# Patient Record
Sex: Male | Born: 1945 | Race: Black or African American | Hispanic: No | Marital: Married | Smoking: Former smoker
Health system: Southern US, Community
[De-identification: ages and names within clinical notes are randomized; demographics above are authoritative.]

## PROBLEM LIST (undated history)

## (undated) DIAGNOSIS — R569 Unspecified convulsions: Secondary | ICD-10-CM

## (undated) DIAGNOSIS — R7611 Nonspecific reaction to tuberculin skin test without active tuberculosis: Secondary | ICD-10-CM

## (undated) DIAGNOSIS — M109 Gout, unspecified: Secondary | ICD-10-CM

## (undated) DIAGNOSIS — I10 Essential (primary) hypertension: Secondary | ICD-10-CM

## (undated) DIAGNOSIS — J301 Allergic rhinitis due to pollen: Secondary | ICD-10-CM

## (undated) DIAGNOSIS — K635 Polyp of colon: Secondary | ICD-10-CM

## (undated) DIAGNOSIS — E785 Hyperlipidemia, unspecified: Secondary | ICD-10-CM

## (undated) DIAGNOSIS — M1990 Unspecified osteoarthritis, unspecified site: Secondary | ICD-10-CM

## (undated) DIAGNOSIS — R339 Retention of urine, unspecified: Secondary | ICD-10-CM

## (undated) DIAGNOSIS — J45909 Unspecified asthma, uncomplicated: Secondary | ICD-10-CM

## (undated) HISTORY — DX: Polyp of colon: K63.5

## (undated) HISTORY — DX: Hyperlipidemia, unspecified: E78.5

## (undated) HISTORY — DX: Allergic rhinitis due to pollen: J30.1

## (undated) HISTORY — DX: Unspecified convulsions: R56.9

## (undated) HISTORY — PX: TOOTH EXTRACTION: SUR596

## (undated) HISTORY — DX: Essential (primary) hypertension: I10

## (undated) HISTORY — DX: Unspecified osteoarthritis, unspecified site: M19.90

## (undated) HISTORY — DX: Nonspecific reaction to tuberculin skin test without active tuberculosis: R76.11

## (undated) HISTORY — DX: Retention of urine, unspecified: R33.9

## (undated) HISTORY — PX: COLONOSCOPY: SHX174

## (undated) HISTORY — DX: Gout, unspecified: M10.9

## (undated) HISTORY — DX: Unspecified asthma, uncomplicated: J45.909

---
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
## 1996-12-10 HISTORY — PX: HERNIA REPAIR: SHX51

## 2013-07-13 LAB — LIPID PANEL
CHOLESTEROL: 187 mg/dL (ref 0–200)
HDL: 37 mg/dL (ref 35–70)
LDL CALC: 121 mg/dL
Triglycerides: 145 mg/dL (ref 40–160)

## 2013-07-13 LAB — BASIC METABOLIC PANEL
Creatinine: 0.8 mg/dL (ref 0.6–1.3)
Glucose: 98 mg/dL
Potassium: 3.4 mmol/L (ref 3.4–5.3)

## 2013-11-26 ENCOUNTER — Other Ambulatory Visit: Payer: Self-pay | Admitting: Family Medicine

## 2013-11-26 NOTE — Telephone Encounter (Signed)
Pt requesting refill of abx. pls advise 

## 2013-11-27 NOTE — Telephone Encounter (Signed)
Rx approved and sent to requested pharmacy per Nicki Reaper, NP.

## 2013-12-24 ENCOUNTER — Other Ambulatory Visit: Payer: Self-pay | Admitting: Internal Medicine

## 2013-12-24 NOTE — Telephone Encounter (Signed)
Spoke to pt and advised per Dr Deborra Medina. He states that he will wait until 12/29/13 appt to have refilled

## 2013-12-24 NOTE — Telephone Encounter (Signed)
Needs to get refilled from previous provider.  I cannot refill medications before he is established here.

## 2013-12-24 NOTE — Telephone Encounter (Signed)
Last filled 02/2013, not by anyone from this practice--pt has a new pt appt with you on 12/29/13--please advise

## 2013-12-29 ENCOUNTER — Encounter: Payer: Self-pay | Admitting: Family Medicine

## 2013-12-29 ENCOUNTER — Ambulatory Visit (INDEPENDENT_AMBULATORY_CARE_PROVIDER_SITE_OTHER): Payer: Federal, State, Local not specified - PPO | Admitting: Family Medicine

## 2013-12-29 ENCOUNTER — Telehealth: Payer: Self-pay

## 2013-12-29 VITALS — BP 186/92 | HR 89 | Temp 98.2°F | Ht 72.5 in | Wt 219.5 lb

## 2013-12-29 DIAGNOSIS — I10 Essential (primary) hypertension: Secondary | ICD-10-CM

## 2013-12-29 DIAGNOSIS — E785 Hyperlipidemia, unspecified: Secondary | ICD-10-CM | POA: Insufficient documentation

## 2013-12-29 DIAGNOSIS — N529 Male erectile dysfunction, unspecified: Secondary | ICD-10-CM | POA: Insufficient documentation

## 2013-12-29 DIAGNOSIS — E876 Hypokalemia: Secondary | ICD-10-CM | POA: Insufficient documentation

## 2013-12-29 DIAGNOSIS — M109 Gout, unspecified: Secondary | ICD-10-CM | POA: Insufficient documentation

## 2013-12-29 DIAGNOSIS — R399 Unspecified symptoms and signs involving the genitourinary system: Secondary | ICD-10-CM | POA: Insufficient documentation

## 2013-12-29 MED ORDER — ALLOPURINOL 300 MG PO TABS: 300.0000 mg | ORAL_TABLET | Freq: Every day | ORAL | Status: DC

## 2013-12-29 MED ORDER — LISINOPRIL 40 MG PO TABS: 40.0000 mg | ORAL_TABLET | Freq: Every day | ORAL | Status: DC

## 2013-12-29 MED ORDER — KETOCONAZOLE 2 % EX CREA: 1.0000 "application " | TOPICAL_CREAM | Freq: Every day | CUTANEOUS | Status: DC

## 2013-12-29 NOTE — Patient Instructions (Addendum)
It was nice to meet you. Please come see me in 1 week.  Please STOP taking your current blood pressure medication- lisinopril-HCTZ.  Start taking Lisinopril 40 mg daily.   We will check some blood work when I see you next week.

## 2013-12-29 NOTE — Assessment & Plan Note (Signed)
See note under HTN. Continue allopurinol.

## 2013-12-29 NOTE — Addendum Note (Signed)
Addended by: Modena Nunnery on: 12/29/2013 04:11 PM   Modules accepted: Orders

## 2013-12-29 NOTE — Assessment & Plan Note (Signed)
May be able to d/c potassium supplementation once we d/c diuretic.  Check labs next week. The patient indicates understanding of these issues and agrees with the plan.

## 2013-12-29 NOTE — Assessment & Plan Note (Signed)
Very elevated today but just took medication. Per pt, has been normotensive.  Given h/o gout and hypokalemia, will d/c Prinivil (has diuretic which can worsen both of these issues).  Start Lisinopril 40 mg daily.  Follow up in 1 week, will likely need to add a second agent.  If he is symptomatic, will need to add second agent before his appt. The patient indicates understanding of these issues and agrees with the plan.

## 2013-12-29 NOTE — Progress Notes (Signed)
Subjective:    Patient ID: Edward Wolf, male    DOB: November 15, 1946, 68 y.o.   MRN: [id number]  HPI  Very pleasant 68 yo male here to establish care.  1.  HTN- very high today.  Just took his medication- lisinopril 20-HCTZ 12. 5 mg daily.  Per pt, typically very well controlled. Denies any HA, blurred vision, CP or SOB.  2.  Hypokalemia- takes kdur but often forgets to take it.  3.  Gout- diagnosed within the last year.  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  Typically has flares in right great toe.  Very sensitive to diet- gets a flare if he eats meat.  4.  HLD- on Zocor 40 mg daily. Lipids last checked in 07/2013.   Lab Results  Component Value Date   CHOL 187 07/13/2013   HDL 37 07/13/2013   LDLCALC 121 07/13/2013   TRIG 145 07/13/2013   Patient Active Problem List   Diagnosis Date Noted  . Lower urinary tract symptoms (LUTS) 12/29/2013  . Gout 12/29/2013  . Hypokalemia 12/29/2013  . Erectile dysfunction 12/29/2013  . Hyperlipidemia   . Hypertension    Past Medical History  Diagnosis Date  . Asthma   . Arthritis   . Hay fever   . Colon polyps   . Positive TB test   . Hyperlipidemia   . Hypertension    Past Surgical History  Procedure Laterality Date  . Wrist surgery  1964  . Hernia repair  1998  . Colonoscopy    . Tooth extraction     History  Substance Use Topics  . Smoking status: Current Every Day Smoker  . Smokeless tobacco: Never Used  . Alcohol Use: Yes   Family History  Problem Relation Age of Onset  . Arthritis Mother   . Cancer Mother   . Hyperlipidemia Mother   . Heart disease Mother   . Hypertension Mother   . Arthritis Father   . Cancer Father   . Hyperlipidemia Father   . Heart disease Father   . Hypertension Father   . Hyperlipidemia Sister   . Hypertension Sister   . Heart disease Sister   . Arthritis Maternal Grandmother   . Arthritis Maternal Grandfather   . Arthritis Paternal Grandmother   . Arthritis Paternal Grandfather    Allergies no  known allergies Current Outpatient Prescriptions on File Prior to Visit  Medication Sig Dispense Refill  . minocycline (MINOCIN,DYNACIN) 100 MG capsule TAKE 1 CAPSULE BY MOUTH TWICE DAILY  180 capsule  1   No current facility-administered medications on file prior to visit.   The PMH, PSH, Social History, Family History, Medications, and allergies have been reviewed in [facility], and have been updated if relevant.   Review of Systems See HPI No HA No CP No blurred vision No SOB No LE edema    Objective:   Physical Exam  Constitutional: He is oriented to person, place, and time. He appears well-developed and well-nourished. No distress.  HENT:  Head: Normocephalic and atraumatic.  Eyes: Pupils are equal, round, and reactive to light.  Neck: Normal range of motion.  Cardiovascular: Normal rate, regular rhythm and normal heart sounds.   Pulmonary/Chest: Effort normal and breath sounds normal.  Abdominal: Soft. Bowel sounds are normal.  Neurological: He is alert and oriented to person, place, and time.  Skin: Skin is warm and dry.  Psychiatric: He has a normal mood and affect. His behavior is normal. Judgment and thought content normal.  BP 186/92  Pulse 89  Temp(Src) 98.2 F (36.8 C) (Oral)  Ht 6' 0.5" (1.842 m)  Wt 219 lb 8 oz (99.565 kg)  BMI 29.34 kg/m2  SpO2 97%        Assessment & Plan:

## 2013-12-29 NOTE — Progress Notes (Signed)
Pre-visit discussion using our clinic review tool. No additional management support is needed unless otherwise documented below in the visit note.  

## 2013-12-29 NOTE — Assessment & Plan Note (Signed)
Well controlled on current dose of Zocor. No changes.

## 2013-12-29 NOTE — Telephone Encounter (Signed)
Pt was seen today and was to call back with information about inhaler to be sent to pharmacy; proventil inhaler HFA 90 mcg; may inhale 2 puffs four times a day as needed. [facility].

## 2013-12-29 NOTE — Telephone Encounter (Signed)
Spoke to pt who states that he is not needing a refill at this time. He was seen at ov earlier today and was unsure as to what dosage medication was. I have updated his chart with correct med and d/c incorrect entry

## 2013-12-30 ENCOUNTER — Telehealth: Payer: Self-pay | Admitting: Family Medicine

## 2013-12-30 NOTE — Telephone Encounter (Signed)
Relevant patient education assigned to patient using Emmi. ° °

## 2014-01-01 ENCOUNTER — Other Ambulatory Visit: Payer: Self-pay | Admitting: Family Medicine

## 2014-01-01 DIAGNOSIS — I10 Essential (primary) hypertension: Secondary | ICD-10-CM

## 2014-01-01 DIAGNOSIS — E876 Hypokalemia: Secondary | ICD-10-CM

## 2014-01-05 ENCOUNTER — Other Ambulatory Visit (INDEPENDENT_AMBULATORY_CARE_PROVIDER_SITE_OTHER): Payer: Federal, State, Local not specified - PPO

## 2014-01-05 ENCOUNTER — Telehealth: Payer: Self-pay | Admitting: Family Medicine

## 2014-01-05 DIAGNOSIS — I10 Essential (primary) hypertension: Secondary | ICD-10-CM

## 2014-01-05 DIAGNOSIS — E876 Hypokalemia: Secondary | ICD-10-CM

## 2014-01-05 LAB — BASIC METABOLIC PANEL
BUN: 8 mg/dL (ref 6–23)
CHLORIDE: 104 meq/L (ref 96–112)
CO2: 28 mEq/L (ref 19–32)
Calcium: 9.2 mg/dL (ref 8.4–10.5)
Creatinine, Ser: 0.9 mg/dL (ref 0.4–1.5)
GFR: 114.22 mL/min (ref >60.00)
Glucose, Bld: 85 mg/dL (ref 70–99)
POTASSIUM: 3.1 meq/L — AB (ref 3.5–5.1)
SODIUM: 139 meq/L (ref 135–145)

## 2014-01-05 NOTE — Telephone Encounter (Signed)
Yes please have him make an appt to see me so we can add another BP agent. Thanks.

## 2014-01-05 NOTE — Telephone Encounter (Signed)
Patient notified as instructed by telephone. Follow-up appointment scheduled. 

## 2014-01-05 NOTE — Telephone Encounter (Signed)
Pt was in office and requested BP check to send to Dr. Deborra Medina (he said that she requested this at his last visit).  BP 165/105.  If pt needs to be called, please call cell at [phone].

## 2014-01-07 ENCOUNTER — Encounter: Payer: Self-pay | Admitting: Family Medicine

## 2014-01-07 ENCOUNTER — Ambulatory Visit (INDEPENDENT_AMBULATORY_CARE_PROVIDER_SITE_OTHER): Payer: Federal, State, Local not specified - PPO | Admitting: Family Medicine

## 2014-01-07 VITALS — BP 162/100 | HR 82 | Temp 98.4°F | Ht 72.5 in | Wt 219.2 lb

## 2014-01-07 DIAGNOSIS — E876 Hypokalemia: Secondary | ICD-10-CM

## 2014-01-07 DIAGNOSIS — I10 Essential (primary) hypertension: Secondary | ICD-10-CM

## 2014-01-07 MED ORDER — LISINOPRIL-HYDROCHLOROTHIAZIDE 20-12.5 MG PO TABS: 2.0000 | ORAL_TABLET | Freq: Every day | ORAL | Status: DC

## 2014-01-07 MED ORDER — POTASSIUM CHLORIDE CRYS ER 20 MEQ PO TBCR: 20.0000 meq | EXTENDED_RELEASE_TABLET | Freq: Every day | ORAL | Status: DC

## 2014-01-07 NOTE — Assessment & Plan Note (Signed)
Deteriorated. Had a long discussion with Edward Wolf.  He is aware that diuretic may exacerbate his gout but he has adjusted his diet and would like to go back on HCTZ- will add 25 mg to Lisinopril 40 mg as he BP was not controlled on previous dose.  Follow up with me next week. The patient indicates understanding of these issues and agrees with the plan.

## 2014-01-07 NOTE — Progress Notes (Signed)
Subjective:    Patient ID: Edward Wolf, male    DOB: 1946-04-23, 68 y.o.   MRN: [id number]  HPI  Very pleasant 68 yo male here for follow up.  1.  HTN- established care with me on 12/29/2013.  He was on Prinivil and BP was very high today.   I d/c'd the prinivil since he has been having issues with gout and HCTZ can exacerbate gout. Start lisinopril 40 mg daily and had him follow up today as we will likely need to add a second agent.  Has not felt right since we made the change- feels BP is up and not urinating as completely.  2.  Hypokalemia- takes kdur but often forgets to take it.  Checked his lytes 2 days ago since we are stopping his diuretic, may no longer need supplementation but potassium remains a little low. Lab Results  Component Value Date   NA 139 01/05/2014   K 3.1* 01/05/2014   CL 104 01/05/2014   CO2 28 01/05/2014       Patient Active Problem List   Diagnosis Date Noted  . Lower urinary tract symptoms (LUTS) 12/29/2013  . Gout 12/29/2013  . Hypokalemia 12/29/2013  . Erectile dysfunction 12/29/2013  . Hyperlipidemia   . Hypertension    Past Medical History  Diagnosis Date  . Asthma   . Arthritis   . Hay fever   . Colon polyps   . Positive TB test   . Hyperlipidemia   . Hypertension    Past Surgical History  Procedure Laterality Date  . Wrist surgery  1964  . Hernia repair  1998  . Colonoscopy    . Tooth extraction     History  Substance Use Topics  . Smoking status: Current Every Day Smoker  . Smokeless tobacco: Never Used  . Alcohol Use: Yes   Family History  Problem Relation Age of Onset  . Arthritis Mother   . Cancer Mother   . Hyperlipidemia Mother   . Heart disease Mother   . Hypertension Mother   . Arthritis Father   . Cancer Father   . Hyperlipidemia Father   . Heart disease Father   . Hypertension Father   . Hyperlipidemia Sister   . Hypertension Sister   . Heart disease Sister   . Arthritis Maternal Grandmother   .  Arthritis Maternal Grandfather   . Arthritis Paternal Grandmother   . Arthritis Paternal Grandfather    No Known Allergies Current Outpatient Prescriptions on File Prior to Visit  Medication Sig Dispense Refill  . albuterol (PROVENTIL HFA) 108 (90 BASE) MCG/ACT inhaler Inhale 2 puffs into the lungs 4 (four) times daily as needed for wheezing or shortness of breath.      . allopurinol (ZYLOPRIM) 300 MG tablet Take 1 tablet (300 mg total) by mouth daily.  30 tablet  3  . ciprofloxacin (CIPRO) 500 MG tablet Take 500 mg by mouth 2 (two) times daily.      Marland Kitchen ketoconazole (NIZORAL) 2 % cream Apply 1 application topically daily.  15 g  1  . lisinopril (PRINIVIL,ZESTRIL) 40 MG tablet Take 1 tablet (40 mg total) by mouth daily.  30 tablet  3  . minocycline (MINOCIN,DYNACIN) 100 MG capsule TAKE 1 CAPSULE BY MOUTH TWICE DAILY  180 capsule  1  . potassium chloride (K-DUR,KLOR-CON) 10 MEQ tablet Take 10 mEq by mouth daily. Take 3 tabs by mouth daily      . sildenafil (VIAGRA) 100 MG tablet Take  100 mg by mouth daily as needed for erectile dysfunction.      . simvastatin (ZOCOR) 40 MG tablet Take 40 mg by mouth at bedtime.       No current facility-administered medications on file prior to visit.   The PMH, PSH, Social History, Family History, Medications, and allergies have been reviewed in [facility], and have been updated if relevant.   Review of Systems See HPI No HA No CP No blurred vision No SOB No LE edema    Objective:   Physical Exam  BP 162/100  Pulse 82  Temp(Src) 98.4 F (36.9 C) (Oral)  Ht 6' 0.5" (1.842 m)  Wt 219 lb 4 oz (99.451 kg)  BMI 29.31 kg/m2  SpO2 99%  Constitutional: He is oriented to person, place, and time. He appears well-developed and well-nourished. No distress.  HENT:  Head: Normocephalic and atraumatic.  Eyes: Pupils are equal, round, and reactive to light.  Neck: Normal range of motion.  Cardiovascular: Normal rate, regular rhythm and normal heart sounds.    Pulmonary/Chest: Effort normal and breath sounds normal.  Abdominal: Soft. Bowel sounds are normal.  Neurological: He is alert and oriented to person, place, and time.  Skin: Skin is warm and dry.  Psychiatric: He has a normal mood and affect. His behavior is normal. Judgment and thought content normal.     Assessment & Plan:

## 2014-01-07 NOTE — Patient Instructions (Signed)
Let's stop lisinopril.  Start HCTZ 25 mg daily and lisinopril 20 mg daily- 2 tablets every morning.  We are changing your potassium to 20 meq- 1 tablet daily.  Please come see me before you leave on your trip.

## 2014-01-07 NOTE — Assessment & Plan Note (Signed)
Persistent.  He is having a hard time remember to take 10 mEq 3 times daily.  Will increase to 20 mEq once daily. The patient indicates understanding of these issues and agrees with the plan.

## 2014-01-07 NOTE — Progress Notes (Signed)
Pre-visit discussion using our clinic review tool. No additional management support is needed unless otherwise documented below in the visit note.  

## 2014-01-12 ENCOUNTER — Telehealth: Payer: Self-pay | Admitting: *Deleted

## 2014-01-12 NOTE — Telephone Encounter (Signed)
Fantastic! Thanks for the update.

## 2014-01-12 NOTE — Telephone Encounter (Signed)
Patient called to give BP readings, in the am before medication 124/88, after medication in the afternoon  118/78. Medication is working fine and no swelling. Has an appointment 01/25/14.

## 2014-01-13 ENCOUNTER — Ambulatory Visit: Payer: Federal, State, Local not specified - PPO | Admitting: Family Medicine

## 2014-01-25 ENCOUNTER — Ambulatory Visit: Payer: Federal, State, Local not specified - PPO | Admitting: Family Medicine

## 2014-02-04 ENCOUNTER — Ambulatory Visit: Payer: Federal, State, Local not specified - PPO | Admitting: Family Medicine

## 2014-02-08 ENCOUNTER — Ambulatory Visit (INDEPENDENT_AMBULATORY_CARE_PROVIDER_SITE_OTHER): Payer: Federal, State, Local not specified - PPO | Admitting: Family Medicine

## 2014-02-08 ENCOUNTER — Encounter: Payer: Self-pay | Admitting: Family Medicine

## 2014-02-08 VITALS — BP 138/88 | HR 71 | Temp 98.1°F | Wt 226.8 lb

## 2014-02-08 DIAGNOSIS — E876 Hypokalemia: Secondary | ICD-10-CM

## 2014-02-08 DIAGNOSIS — I10 Essential (primary) hypertension: Secondary | ICD-10-CM

## 2014-02-08 LAB — BASIC METABOLIC PANEL
BUN: 13 mg/dL (ref 6–23)
CALCIUM: 9.2 mg/dL (ref 8.4–10.5)
CO2: 29 meq/L (ref 19–32)
Chloride: 102 mEq/L (ref 96–112)
Creatinine, Ser: 0.9 mg/dL (ref 0.4–1.5)
GFR: 106.98 mL/min (ref >60.00)
Glucose, Bld: 90 mg/dL (ref 70–99)
POTASSIUM: 3.1 meq/L — AB (ref 3.5–5.1)
SODIUM: 139 meq/L (ref 135–145)

## 2014-02-08 MED ORDER — POTASSIUM CHLORIDE CRYS ER 10 MEQ PO TBCR: 30.0000 meq | EXTENDED_RELEASE_TABLET | Freq: Every day | ORAL | Status: DC

## 2014-02-08 NOTE — Addendum Note (Signed)
Addended by: Lucille Passy on: 02/08/2014 07:24 PM   Modules accepted: Orders

## 2014-02-08 NOTE — Assessment & Plan Note (Signed)
Well controlled.  No changes. No gout flares.

## 2014-02-08 NOTE — Progress Notes (Signed)
Subjective:    Patient ID: Edward Wolf, male    DOB: 1946-06-19, 68 y.o.   MRN: [id number]  HPI  Very pleasant 68 yo male here for follow up.  1.  HTN- established care with me on 12/29/2013.  He was on Prinivil and BP was very high today.   I d/c'd the prinivil since he has been having issues with gout and HCTZ can exacerbate gout. He did not like how he felt off diurectic, so we restarted it.  He was aware it could exacerbate his gout but willing to take that risk.  Now taking Lisinopril-HCTZ 40-25 mg daily. Denies any HA, blurred vision, CP or SOB. BP has been well controlled.  2.  Hypokalemia- takes kdur but often forgets to take it.  I changed his dose 20 meq daily rather than three times daily dosing and he has been taking it more regularly. Lab Results  Component Value Date   NA 139 01/05/2014   K 3.1* 01/05/2014   CL 104 01/05/2014   CO2 28 01/05/2014       Patient Active Problem List   Diagnosis Date Noted  . Lower urinary tract symptoms (LUTS) 12/29/2013  . Gout 12/29/2013  . Hypokalemia 12/29/2013  . Erectile dysfunction 12/29/2013  . Hyperlipidemia   . Hypertension    Past Medical History  Diagnosis Date  . Asthma   . Arthritis   . Hay fever   . Colon polyps   . Positive TB test   . Hyperlipidemia   . Hypertension    Past Surgical History  Procedure Laterality Date  . Wrist surgery  1964  . Hernia repair  1998  . Colonoscopy    . Tooth extraction     History  Substance Use Topics  . Smoking status: Current Every Day Smoker  . Smokeless tobacco: Never Used  . Alcohol Use: Yes   Family History  Problem Relation Age of Onset  . Arthritis Mother   . Cancer Mother   . Hyperlipidemia Mother   . Heart disease Mother   . Hypertension Mother   . Arthritis Father   . Cancer Father   . Hyperlipidemia Father   . Heart disease Father   . Hypertension Father   . Hyperlipidemia Sister   . Hypertension Sister   . Heart disease Sister   . Arthritis  Maternal Grandmother   . Arthritis Maternal Grandfather   . Arthritis Paternal Grandmother   . Arthritis Paternal Grandfather    No Known Allergies Current Outpatient Prescriptions on File Prior to Visit  Medication Sig Dispense Refill  . albuterol (PROVENTIL HFA) 108 (90 BASE) MCG/ACT inhaler Inhale 2 puffs into the lungs 4 (four) times daily as needed for wheezing or shortness of breath.      . allopurinol (ZYLOPRIM) 300 MG tablet Take 1 tablet (300 mg total) by mouth daily.  30 tablet  3  . ketoconazole (NIZORAL) 2 % cream Apply 1 application topically daily.  15 g  1  . lisinopril-hydrochlorothiazide (PRINZIDE,ZESTORETIC) 20-12.5 MG per tablet Take 2 tablets by mouth daily.  60 tablet  3  . minocycline (MINOCIN,DYNACIN) 100 MG capsule TAKE 1 CAPSULE BY MOUTH TWICE DAILY  180 capsule  1  . potassium chloride SA (K-DUR,KLOR-CON) 20 MEQ tablet Take 1 tablet (20 mEq total) by mouth daily.  30 tablet  3  . sildenafil (VIAGRA) 100 MG tablet Take 100 mg by mouth daily as needed for erectile dysfunction.      . simvastatin (  ZOCOR) 40 MG tablet Take 40 mg by mouth at bedtime.       No current facility-administered medications on file prior to visit.   The PMH, PSH, Social History, Family History, Medications, and allergies have been reviewed in [facility], and have been updated if relevant.   Review of Systems See HPI No HA No CP No blurred vision No SOB No LE edema    Objective:   Physical Exam  BP 138/88  Pulse 71  Temp(Src) 98.1 F (36.7 C) (Oral)  Wt 226 lb 12 oz (102.853 kg)  SpO2 98%  Constitutional: He is oriented to person, place, and time. He appears well-developed and well-nourished. No distress.  HENT:  Head: Normocephalic and atraumatic.  Eyes: Pupils are equal, round, and reactive to light.  Neck: Normal range of motion.  Cardiovascular: Normal rate, regular rhythm and normal heart sounds.   Pulmonary/Chest: Effort normal and breath sounds normal.  Abdominal: Soft.  Bowel sounds are normal.  Neurological: He is alert and oriented to person, place, and time.  Skin: Skin is warm and dry.  Psychiatric: He has a normal mood and affect. His behavior is normal. Judgment and thought content normal.     Assessment & Plan:

## 2014-02-08 NOTE — Patient Instructions (Signed)
Good to see you. We will call you with your lab results or you can view them online.  Keep me updated. Let's not make any changes to your medications for now.

## 2014-02-08 NOTE — Assessment & Plan Note (Signed)
More compliant with once daily dosing of potassium. Recheck BMET today. Orders Placed This Encounter  Procedures  . Basic Metabolic Panel

## 2014-02-08 NOTE — Progress Notes (Signed)
Pre visit review using our clinic review tool, if applicable. No additional management support is needed unless otherwise documented below in the visit note. 

## 2014-02-09 ENCOUNTER — Telehealth: Payer: Self-pay | Admitting: Family Medicine

## 2014-02-09 NOTE — Telephone Encounter (Signed)
Relevant patient education assigned to patient using Emmi. ° °

## 2014-02-10 ENCOUNTER — Other Ambulatory Visit: Payer: Self-pay | Admitting: Family Medicine

## 2014-02-10 DIAGNOSIS — E876 Hypokalemia: Secondary | ICD-10-CM

## 2014-03-10 ENCOUNTER — Other Ambulatory Visit (INDEPENDENT_AMBULATORY_CARE_PROVIDER_SITE_OTHER): Payer: Federal, State, Local not specified - PPO

## 2014-03-10 DIAGNOSIS — E876 Hypokalemia: Secondary | ICD-10-CM

## 2014-03-10 LAB — BASIC METABOLIC PANEL
BUN: 11 mg/dL (ref 6–23)
CHLORIDE: 101 meq/L (ref 96–112)
CO2: 29 mEq/L (ref 19–32)
Calcium: 9.3 mg/dL (ref 8.4–10.5)
Creatinine, Ser: 0.9 mg/dL (ref 0.4–1.5)
GFR: 112.65 mL/min (ref >60.00)
Glucose, Bld: 100 mg/dL — ABNORMAL HIGH (ref 70–99)
Potassium: 3 mEq/L — ABNORMAL LOW (ref 3.5–5.1)
Sodium: 137 mEq/L (ref 135–145)

## 2014-03-16 NOTE — Addendum Note (Signed)
Addended by: Modena Nunnery on: 03/16/2014 04:14 PM   Modules accepted: Orders

## 2014-03-31 ENCOUNTER — Other Ambulatory Visit: Payer: Federal, State, Local not specified - PPO

## 2014-04-07 ENCOUNTER — Other Ambulatory Visit (INDEPENDENT_AMBULATORY_CARE_PROVIDER_SITE_OTHER): Payer: Federal, State, Local not specified - PPO

## 2014-04-07 DIAGNOSIS — E876 Hypokalemia: Secondary | ICD-10-CM

## 2014-04-07 LAB — COMPREHENSIVE METABOLIC PANEL
ALBUMIN: 4.2 g/dL (ref 3.5–5.2)
ALT: 19 U/L (ref 0–53)
AST: 21 U/L (ref 0–37)
Alkaline Phosphatase: 63 U/L (ref 39–117)
BUN: 8 mg/dL (ref 6–23)
CO2: 27 mEq/L (ref 19–32)
Calcium: 9.2 mg/dL (ref 8.4–10.5)
Chloride: 104 mEq/L (ref 96–112)
Creatinine, Ser: 0.8 mg/dL (ref 0.4–1.5)
GFR: 127.75 mL/min (ref >60.00)
GLUCOSE: 124 mg/dL — AB (ref 70–99)
POTASSIUM: 3 meq/L — AB (ref 3.5–5.1)
SODIUM: 139 meq/L (ref 135–145)
TOTAL PROTEIN: 7.6 g/dL (ref 6.0–8.3)
Total Bilirubin: 0.8 mg/dL (ref 0.3–1.2)

## 2014-04-12 ENCOUNTER — Telehealth: Payer: Self-pay | Admitting: Family Medicine

## 2014-04-12 ENCOUNTER — Emergency Department: Payer: Self-pay | Admitting: Emergency Medicine

## 2014-04-12 NOTE — Telephone Encounter (Signed)
Patient Information:  Caller Name: Maricus  Phone: [phone]  Patient: Edward Wolf, Edward Wolf  Gender: Male  DOB: 1946-08-26  Age: 68 Years  PCP: [facility])  Office Follow Up:  Does the office need to follow up with this patient?: No  Instructions For The Office: N/A  RN Note:  ER CALL. Nasal Congestion, slight Cough w/ SOB, onset 1 week.  Pt's Son was dx w/ Pneumonia on 5-3. Pt states he had wheezing upon expiratory respirations, no audible wheezing at triage. Pt concerned he has Pneumonia, requesting appt or chest xray.  All emergent sxs ruled out per Cough protocol, go to office now d/t wheezing. Office has no available appts, offered appt at another location, Pt is wanting to go to ED or UC for xray to rule out pneumonia d/t exposure from Son.  Consulted w/ Rollene Fare, RN, per Dr Deborra Medina, ok for Pt to go to [facility] or ED if not waiting to be seen by another [facility] location.  Pt will call back to set up f/u appt to discuss lab results w/ Dr Deborra Medina.  Symptoms  Reason For Call & Symptoms: ER CALL. Nasal Congestion, slight Cough w/ SOB, onset 1 week.  Reviewed Health History In EMR: N/A  Reviewed Medications In EMR: N/A  Reviewed Allergies In EMR: N/A  Reviewed Surgeries / Procedures: N/A  Date of Onset of Symptoms: 04/05/2014  Guideline(s) Used:  Cough  Disposition Per Guideline:   Go to Office Now  Reason For Disposition Reached:   Wheezing is present  Advice Given:  N/A  Patient Will Follow Care Advice:  YES

## 2014-04-14 ENCOUNTER — Encounter: Payer: Self-pay | Admitting: Family Medicine

## 2014-04-14 ENCOUNTER — Ambulatory Visit (INDEPENDENT_AMBULATORY_CARE_PROVIDER_SITE_OTHER): Payer: Federal, State, Local not specified - PPO | Admitting: Family Medicine

## 2014-04-14 VITALS — BP 160/98 | HR 92 | Temp 98.7°F | Wt 216.0 lb

## 2014-04-14 DIAGNOSIS — J209 Acute bronchitis, unspecified: Secondary | ICD-10-CM

## 2014-04-14 DIAGNOSIS — I10 Essential (primary) hypertension: Secondary | ICD-10-CM

## 2014-04-14 DIAGNOSIS — E876 Hypokalemia: Secondary | ICD-10-CM

## 2014-04-14 NOTE — Assessment & Plan Note (Signed)
Will request ER notes. Finish prednisone, zpack, prn albuterol.  Advised prevnar when he feels better. Call or return to clinic prn if these symptoms worsen or fail to improve as anticipated. The patient indicates understanding of these issues and agrees with the plan.

## 2014-04-14 NOTE — Progress Notes (Signed)
Pre visit review using our clinic review tool, if applicable. No additional management support is needed unless otherwise documented below in the visit note. 

## 2014-04-14 NOTE — Patient Instructions (Signed)
Good to see you. Please finish your medication that was given to you in the ER.  Please come back in 2 weeks for blood work only.

## 2014-04-14 NOTE — Assessment & Plan Note (Signed)
Continue Kdur and increasing dietary potassium. Follow up BMET in 2 weeks. The patient indicates understanding of these issues and agrees with the plan.

## 2014-04-14 NOTE — Assessment & Plan Note (Signed)
Deteriorated likely due to acute illness, rx. Continue to monitor. No changes today.

## 2014-04-14 NOTE — Progress Notes (Signed)
Subjective:    Patient ID: Edward Wolf, male    DOB: 03-30-1946, 68 y.o.   MRN: [id number]  HPI  Very pleasant 68 yo male here for follow up.  1.  HTN- Taking Lisinopril-HCTZ 40-25 mg daily. Denies any HA, blurred vision, CP or SOB. BP has been well controlled.  BP elevated today has been sick and on prednisone and has not taken his medication yet. BP Readings from Last 3 Encounters:  04/14/14 160/98  02/08/14 138/88  01/07/14 162/100     2.  Hypokalemia- On Kdur 40 meq daily but potassium remains low.  Advised dietary increase in potassium as well- eating more bananas, OJ and dried fruit. Lab Results  Component Value Date   NA 139 04/07/2014   K 3.0* 04/07/2014   CL 104 04/07/2014   CO2 27 04/07/2014   3. Acute bronchitis- seen at [facility] yesterday.  Placed on prednisone and zpack.  Per pt, PNA ruled on  on CXR.  Already feels less short of breath today.  Not feeling feverish.    Patient Active Problem List   Diagnosis Date Noted  . Lower urinary tract symptoms (LUTS) 12/29/2013  . Gout 12/29/2013  . Hypokalemia 12/29/2013  . Erectile dysfunction 12/29/2013  . Hyperlipidemia   . Hypertension    Past Medical History  Diagnosis Date  . Asthma   . Arthritis   . Hay fever   . Colon polyps   . Positive TB test   . Hyperlipidemia   . Hypertension    Past Surgical History  Procedure Laterality Date  . Wrist surgery  1964  . Hernia repair  1998  . Colonoscopy    . Tooth extraction     History  Substance Use Topics  . Smoking status: Current Every Day Smoker    Types: Cigars  . Smokeless tobacco: Never Used  . Alcohol Use: Yes     Comment: occasional   Family History  Problem Relation Age of Onset  . Arthritis Mother   . Cancer Mother   . Hyperlipidemia Mother   . Heart disease Mother   . Hypertension Mother   . Arthritis Father   . Cancer Father   . Hyperlipidemia Father   . Heart disease Father   . Hypertension Father   . Hyperlipidemia Sister   .  Hypertension Sister   . Heart disease Sister   . Arthritis Maternal Grandmother   . Arthritis Maternal Grandfather   . Arthritis Paternal Grandmother   . Arthritis Paternal Grandfather    No Known Allergies Current Outpatient Prescriptions on File Prior to Visit  Medication Sig Dispense Refill  . albuterol (PROVENTIL HFA) 108 (90 BASE) MCG/ACT inhaler Inhale 2 puffs into the lungs 4 (four) times daily as needed for wheezing or shortness of breath.      . allopurinol (ZYLOPRIM) 300 MG tablet Take 1 tablet (300 mg total) by mouth daily.  30 tablet  3  . ketoconazole (NIZORAL) 2 % cream Apply 1 application topically daily.  15 g  1  . lisinopril-hydrochlorothiazide (PRINZIDE,ZESTORETIC) 20-12.5 MG per tablet Take 2 tablets by mouth daily.  60 tablet  3  . minocycline (MINOCIN,DYNACIN) 100 MG capsule TAKE 1 CAPSULE BY MOUTH TWICE DAILY  180 capsule  1  . potassium chloride SA (K-DUR,KLOR-CON) 10 MEQ tablet Take 3 tablets (30 mEq total) by mouth daily.  90 tablet  3  . sildenafil (VIAGRA) 100 MG tablet Take 100 mg by mouth daily as needed for erectile dysfunction.      Marland Kitchen  simvastatin (ZOCOR) 40 MG tablet Take 40 mg by mouth at bedtime.       No current facility-administered medications on file prior to visit.   The PMH, PSH, Social History, Family History, Medications, and allergies have been reviewed in [facility], and have been updated if relevant.   Review of Systems See HPI No HA No CP No blurred vision No palpitations No muscle cramps     Objective:   Physical Exam  BP 160/98  Pulse 92  Temp(Src) 98.7 F (37.1 C) (Oral)  Wt 216 lb (97.977 kg)  SpO2 98%  Constitutional: He is oriented to person, place, and time. He appears well-developed and well-nourished. No distress.  HENT:  Head: Normocephalic and atraumatic.  Eyes: Pupils are equal, round, and reactive to light.  Neck: Normal range of motion.  Cardiovascular: Normal rate, regular rhythm and normal heart sounds.     Pulmonary/Chest: bilateral exp wheezes, left > right, otherwise good air movement.  No crackles Abdominal: Soft. Bowel sounds are normal.  Neurological: He is alert and oriented to person, place, and time.  Skin: Skin is warm and dry.  Psychiatric: He has a normal mood and affect. His behavior is normal. Judgment and thought content normal.     Assessment & Plan:

## 2014-04-21 ENCOUNTER — Other Ambulatory Visit: Payer: Self-pay | Admitting: Family Medicine

## 2014-05-08 ENCOUNTER — Other Ambulatory Visit: Payer: Self-pay | Admitting: Family Medicine

## 2014-05-11 ENCOUNTER — Other Ambulatory Visit: Payer: Federal, State, Local not specified - PPO

## 2014-05-12 ENCOUNTER — Other Ambulatory Visit: Payer: Self-pay | Admitting: Family Medicine

## 2014-05-12 DIAGNOSIS — E876 Hypokalemia: Secondary | ICD-10-CM

## 2014-05-17 ENCOUNTER — Other Ambulatory Visit (INDEPENDENT_AMBULATORY_CARE_PROVIDER_SITE_OTHER): Payer: Federal, State, Local not specified - PPO

## 2014-05-17 DIAGNOSIS — E876 Hypokalemia: Secondary | ICD-10-CM

## 2014-05-17 LAB — BASIC METABOLIC PANEL
BUN: 10 mg/dL (ref 6–23)
CALCIUM: 9.3 mg/dL (ref 8.4–10.5)
CO2: 29 mEq/L (ref 19–32)
Chloride: 102 mEq/L (ref 96–112)
Creatinine, Ser: 0.9 mg/dL (ref 0.4–1.5)
GFR: 109.67 mL/min (ref >60.00)
Glucose, Bld: 86 mg/dL (ref 70–99)
Potassium: 3.2 mEq/L — ABNORMAL LOW (ref 3.5–5.1)
SODIUM: 138 meq/L (ref 135–145)

## 2014-05-19 ENCOUNTER — Ambulatory Visit: Payer: Federal, State, Local not specified - PPO

## 2014-05-21 ENCOUNTER — Ambulatory Visit: Payer: Federal, State, Local not specified - PPO

## 2014-05-21 ENCOUNTER — Telehealth: Payer: Self-pay

## 2014-05-21 NOTE — Telephone Encounter (Signed)
Pt came today for prevnar but pt [facility] 7[facility]8 months [facility]; needs to wait one year between pneumovax and prevnar. pt will cb to reschedule.

## 2014-07-23 ENCOUNTER — Telehealth: Payer: Self-pay

## 2014-07-23 MED ORDER — TADALAFIL 20 MG PO TABS: 10.0000 mg | ORAL_TABLET | ORAL | Status: DC | PRN

## 2014-07-23 NOTE — Telephone Encounter (Signed)
Pt left v/m that he can no longer get viagra (? Ins issue); pt wants to know if can get rx for cialis to [facility].Please advise. Pt request cb.

## 2014-07-23 NOTE — Telephone Encounter (Signed)
Yes.  eRx sent. 

## 2014-07-23 NOTE — Telephone Encounter (Signed)
Spoke to pt and informed him Rx has been sent to requested pharmacy

## 2014-08-19 ENCOUNTER — Other Ambulatory Visit: Payer: Self-pay | Admitting: Family Medicine

## 2014-08-23 ENCOUNTER — Telehealth: Payer: Self-pay | Admitting: *Deleted

## 2014-08-23 NOTE — Telephone Encounter (Signed)
Pt attempted to contact the office and was d/c from call a nurse. I was in Blair's office when they were attempting to find someone to speak with him. Spoke to pt who states that he is currently wheezing. He states that he is having flu like s/s. appt sched for 9/15

## 2014-08-24 ENCOUNTER — Ambulatory Visit (INDEPENDENT_AMBULATORY_CARE_PROVIDER_SITE_OTHER)
Admission: RE | Admit: 2014-08-24 | Discharge: 2014-08-24 | Disposition: A | Discharge status: Home / Self Care | Payer: Federal, State, Local not specified - PPO | Source: Ambulatory Visit | Attending: Family Medicine | Admitting: Family Medicine

## 2014-08-24 ENCOUNTER — Encounter: Payer: Self-pay | Admitting: Family Medicine

## 2014-08-24 ENCOUNTER — Ambulatory Visit (INDEPENDENT_AMBULATORY_CARE_PROVIDER_SITE_OTHER): Payer: Federal, State, Local not specified - PPO | Admitting: Family Medicine

## 2014-08-24 VITALS — BP 142/88 | HR 72 | Temp 98.2°F | Wt 218.5 lb

## 2014-08-24 DIAGNOSIS — Z23 Encounter for immunization: Secondary | ICD-10-CM

## 2014-08-24 DIAGNOSIS — J209 Acute bronchitis, unspecified: Secondary | ICD-10-CM

## 2014-08-24 MED ORDER — PREDNISONE 20 MG PO TABS: ORAL_TABLET | ORAL | Status: DC

## 2014-08-24 MED ORDER — AZITHROMYCIN 250 MG PO TABS: ORAL_TABLET | ORAL | Status: DC

## 2014-08-24 NOTE — Progress Notes (Signed)
Pre visit review using our clinic review tool, if applicable. No additional management support is needed unless otherwise documented below in the visit note. 

## 2014-08-24 NOTE — Addendum Note (Signed)
Addended by: Modena Nunnery on: 08/24/2014 09:32 AM   Modules accepted: Orders

## 2014-08-24 NOTE — Patient Instructions (Signed)
Great to see you. Take prednisone - 2 tabs by mouth daily with food x 7 days. Take albuterol as needed. Take zpack as directed.  I will call you with your xray results.

## 2014-08-24 NOTE — Progress Notes (Signed)
SUBJECTIVE:  Edward Wolf is a 68 y.o. male who complains of coryza, congestion, sneezing, productive cough and wheezing for 8 days. He denies a history of chest pain and shortness of breath and admits to a history of asthma. Patient admits to smoke cigarettes.  PMH significant for PNA. Current Outpatient Prescriptions on File Prior to Visit  Medication Sig Dispense Refill  . albuterol (PROVENTIL HFA) 108 (90 BASE) MCG/ACT inhaler Inhale 2 puffs into the lungs 4 (four) times daily as needed for wheezing or shortness of breath.      . allopurinol (ZYLOPRIM) 300 MG tablet TAKE 1 TABLET (300 MG TOTAL) BY MOUTH DAILY.  30 tablet  3  . ketoconazole (NIZORAL) 2 % cream Apply 1 application topically daily.  15 g  1  . lisinopril-hydrochlorothiazide (PRINZIDE,ZESTORETIC) 20-12.5 MG per tablet TAKE 2 TABLETS BY MOUTH DAILY.  60 tablet  5  . minocycline (MINOCIN,DYNACIN) 100 MG capsule TAKE 1 CAPSULE BY MOUTH TWICE DAILY  180 capsule  1  . potassium chloride SA (K-DUR,KLOR-CON) 10 MEQ tablet Take 3 tablets (30 mEq total) by mouth daily.  90 tablet  3  . simvastatin (ZOCOR) 40 MG tablet Take 40 mg by mouth at bedtime.      . tadalafil (CIALIS) 20 MG tablet Take 0.5-1 tablets (10-20 mg total) by mouth every other day as needed for erectile dysfunction.  5 tablet  11   No current facility-administered medications on file prior to visit.    No Known Allergies  Past Medical History  Diagnosis Date  . Asthma   . Arthritis   . Hay fever   . Colon polyps   . Positive TB test   . Hyperlipidemia   . Hypertension     Past Surgical History  Procedure Laterality Date  . Wrist surgery  1964  . Hernia repair  1998  . Colonoscopy    . Tooth extraction      Family History  Problem Relation Age of Onset  . Arthritis Mother   . Cancer Mother   . Hyperlipidemia Mother   . Heart disease Mother   . Hypertension Mother   . Arthritis Father   . Cancer Father   . Hyperlipidemia Father   . Heart disease  Father   . Hypertension Father   . Hyperlipidemia Sister   . Hypertension Sister   . Heart disease Sister   . Arthritis Maternal Grandmother   . Arthritis Maternal Grandfather   . Arthritis Paternal Grandmother   . Arthritis Paternal Grandfather     History   Social History  . Marital Status: Married    Spouse Name: N/A    Number of Children: N/A  . Years of Education: N/A   Occupational History  . Not on file.   Social History Main Topics  . Smoking status: Current Every Day Smoker    Types: Cigars  . Smokeless tobacco: Never Used  . Alcohol Use: Yes     Comment: occasional  . Drug Use: No  . Sexual Activity: Yes   Other Topics Concern  . Not on file   Social History Narrative  . No narrative on file   The PMH, PSH, Social History, Family History, Medications, and allergies have been reviewed in [facility], and have been updated if relevant.    OBJECTIVE: BP 142/88  Pulse 72  Temp(Src) 98.2 F (36.8 C) (Oral)  Wt 218 lb 8 oz (99.111 kg)  SpO2 97%  He appears well, vital signs are as noted.  Ears normal.  Throat and pharynx normal.  Neck supple. No adenopathy in the neck. Nose is congested. Sinuses non tender.  Diffuse wheezes throughout, good air movement No crackles but difficult to assess due to wheezes  ASSESSMENT:  bronchitis  PLAN: Zpack, prednisone for 7 days. CXR given h/o PNA. Symptomatic therapy suggested: push fluids, rest and return office visit prn if symptoms persist or worsen.  Call or return to clinic prn if these symptoms worsen or fail to improve as anticipated.

## 2014-08-31 ENCOUNTER — Telehealth: Payer: Self-pay

## 2014-08-31 NOTE — Telephone Encounter (Signed)
Pt left v/m to update condition; pt having no breathing problems now; BP has been 130/80. Pt states all is well.pt did not request cb.

## 2014-08-31 NOTE — Telephone Encounter (Signed)
Great! Thanks for the update.

## 2014-10-01 ENCOUNTER — Other Ambulatory Visit: Payer: Self-pay | Admitting: *Deleted

## 2014-10-01 MED ORDER — POTASSIUM CHLORIDE CRYS ER 10 MEQ PO TBCR: 30.0000 meq | EXTENDED_RELEASE_TABLET | Freq: Every day | ORAL | Status: DC

## 2014-10-29 ENCOUNTER — Other Ambulatory Visit: Payer: Self-pay | Admitting: *Deleted

## 2014-10-29 MED ORDER — LISINOPRIL-HYDROCHLOROTHIAZIDE 20-12.5 MG PO TABS: 2.0000 | ORAL_TABLET | Freq: Every day | ORAL | Status: DC

## 2014-11-09 ENCOUNTER — Telehealth: Payer: Self-pay

## 2014-11-09 NOTE — Telephone Encounter (Signed)
Pt left v/m requesting call to [facility] to refill [facility]. [facility] was [facility]. Pt needs med refilled today. Pt request cb.spoke with Sam at [facility] and allopurinol # 20 is ready for pick up. Pt voiced understanding and will ck with pharmacy. Pt wanted to know why can only get # 20 instead of # 30. [facility] if [facility] is all [facility] will allow for 30 day period or does quantity exception form need to be completed.

## 2014-11-25 ENCOUNTER — Other Ambulatory Visit: Payer: Self-pay

## 2014-11-25 MED ORDER — ALLOPURINOL 300 MG PO TABS: ORAL_TABLET | ORAL | Status: DC

## 2014-11-25 NOTE — Telephone Encounter (Signed)
Pt left v/m requesting refill allopurinol to [facility]; pt said was told could only get # 20 the last refill on 11/09/14. Pt request refill for # 30. Pt was last seen 08/2014 for sick visit; pt has appt scheduled for med refills on 12/01/14.  Spoke with Vicente Males at [facility]; pt got allopurinol # 30 on 08/19/14,09/11/14 and 10/08/14 and on 10/30/14 pt got # 10 by paying cash for med due to early refill. On 11/09/14 pt got # 20 filled.Please advise.

## 2014-12-01 ENCOUNTER — Encounter: Payer: Self-pay | Admitting: Family Medicine

## 2014-12-01 ENCOUNTER — Ambulatory Visit (INDEPENDENT_AMBULATORY_CARE_PROVIDER_SITE_OTHER): Payer: Federal, State, Local not specified - PPO | Admitting: Family Medicine

## 2014-12-01 VITALS — BP 132/82 | HR 87 | Temp 98.4°F | Wt 227.5 lb

## 2014-12-01 DIAGNOSIS — I10 Essential (primary) hypertension: Secondary | ICD-10-CM

## 2014-12-01 DIAGNOSIS — M10079 Idiopathic gout, unspecified ankle and foot: Secondary | ICD-10-CM

## 2014-12-01 DIAGNOSIS — E876 Hypokalemia: Secondary | ICD-10-CM

## 2014-12-01 DIAGNOSIS — M109 Gout, unspecified: Secondary | ICD-10-CM

## 2014-12-01 LAB — URIC ACID: URIC ACID, SERUM: 4.1 mg/dL (ref 4.0–7.8)

## 2014-12-01 LAB — BASIC METABOLIC PANEL
BUN: 11 mg/dL (ref 6–23)
CO2: 29 mEq/L (ref 19–32)
CREATININE: 0.9 mg/dL (ref 0.4–1.5)
Calcium: 9.2 mg/dL (ref 8.4–10.5)
Chloride: 103 mEq/L (ref 96–112)
GFR: 113.91 mL/min (ref >60.00)
Glucose, Bld: 91 mg/dL (ref 70–99)
Potassium: 3.3 mEq/L — ABNORMAL LOW (ref 3.5–5.1)
SODIUM: 138 meq/L (ref 135–145)

## 2014-12-01 NOTE — Assessment & Plan Note (Signed)
Recheck potassium today. 

## 2014-12-01 NOTE — Assessment & Plan Note (Signed)
Well controlled on current rx. No changes made I will check potassium and renal fxn today.

## 2014-12-01 NOTE — Patient Instructions (Signed)
Good to see you.  Merry Christmas.  We will call you with your lab results.   

## 2014-12-01 NOTE — Progress Notes (Signed)
Pre visit review using our clinic review tool, if applicable. No additional management support is needed unless otherwise documented below in the visit note. 

## 2014-12-01 NOTE — Progress Notes (Signed)
Subjective:    Patient ID: Edward Wolf, male    DOB: [birth year], 68 y.o.   MRN: [id number]  HPI  Very pleasant 68 yo male here for follow up.  1.  HTN- Taking Lisinopril-HCTZ 40-25 mg daily. Denies any HA, blurred vision, CP or SOB. BP has been well controlled.   BP Readings from Last 3 Encounters:  12/01/14 132/82  08/24/14 142/88  04/14/14 160/98     2.  Hypokalemia- On Kdur 30 meq daily. Advised dietary increase in potassium as well- eating more bananas, OJ and dried fruit. Has been having more "aches and pains."  Not sure if it is due to low potassium or gout. Lab Results  Component Value Date   NA 138 05/17/2014   K 3.2* 05/17/2014   CL 102 05/17/2014   CO2 29 05/17/2014   3. Gout- taking allopurinol daily.  If he follows "the right diet," he does well.  Has been eating more meat and drinking more ETOH due to the holidays.  Having "small flares" in foot/hip.    Patient Active Problem List   Diagnosis Date Noted  . Acute bronchitis 04/14/2014  . Lower urinary tract symptoms (LUTS) 12/29/2013  . Gout 12/29/2013  . Hypokalemia 12/29/2013  . Erectile dysfunction 12/29/2013  . Hyperlipidemia   . Hypertension    Past Medical History  Diagnosis Date  . Asthma   . Arthritis   . Hay fever   . Colon polyps   . Positive TB test   . Hyperlipidemia   . Hypertension    Past Surgical History  Procedure Laterality Date  . Wrist surgery  1964  . Hernia repair  1998  . Colonoscopy    . Tooth extraction     History  Substance Use Topics  . Smoking status: Current Every Day Smoker    Types: Cigars  . Smokeless tobacco: Never Used  . Alcohol Use: Yes     Comment: occasional   Family History  Problem Relation Age of Onset  . Arthritis Mother   . Cancer Mother   . Hyperlipidemia Mother   . Heart disease Mother   . Hypertension Mother   . Arthritis Father   . Cancer Father   . Hyperlipidemia Father   . Heart disease Father   . Hypertension Father   .  Hyperlipidemia Sister   . Hypertension Sister   . Heart disease Sister   . Arthritis Maternal Grandmother   . Arthritis Maternal Grandfather   . Arthritis Paternal Grandmother   . Arthritis Paternal Grandfather    No Known Allergies Current Outpatient Prescriptions on File Prior to Visit  Medication Sig Dispense Refill  . albuterol (PROVENTIL HFA) 108 (90 BASE) MCG/ACT inhaler Inhale 2 puffs into the lungs 4 (four) times daily as needed for wheezing or shortness of breath.    . allopurinol (ZYLOPRIM) 300 MG tablet TAKE 1 TABLET (300 MG TOTAL) BY MOUTH DAILY. 30 tablet 3  . ketoconazole (NIZORAL) 2 % cream Apply 1 application topically daily. 15 g 1  . lisinopril-hydrochlorothiazide (PRINZIDE,ZESTORETIC) 20-12.5 MG per tablet Take 2 tablets by mouth daily. 60 tablet 3  . minocycline (MINOCIN,DYNACIN) 100 MG capsule TAKE 1 CAPSULE BY MOUTH TWICE DAILY 180 capsule 1  . potassium chloride (K-DUR,KLOR-CON) 10 MEQ tablet Take 3 tablets (30 mEq total) by mouth daily. 90 tablet 3  . simvastatin (ZOCOR) 40 MG tablet Take 40 mg by mouth at bedtime.    . tadalafil (CIALIS) 20 MG tablet Take 0.5-1 tablets (  10-20 mg total) by mouth every other day as needed for erectile dysfunction. 5 tablet 11   No current facility-administered medications on file prior to visit.   The PMH, PSH, Social History, Family History, Medications, and allergies have been reviewed in [facility], and have been updated if relevant.   Review of Systems See HPI No HA No CP No blurred vision No palpitations + myalgias +arthralgia No abdominal pain No n/v/d No SOB No LE edema     Objective:   Physical Exam  BP 132/82 mmHg  Pulse 87  Temp(Src) 98.4 F (36.9 C) (Oral)  Wt 227 lb 8 oz (103.193 kg)  SpO2 98% Wt Readings from Last 3 Encounters:  12/01/14 227 lb 8 oz (103.193 kg)  08/24/14 218 lb 8 oz (99.111 kg)  04/14/14 216 lb (97.977 kg)    Constitutional: He is oriented to person, place, and time. He appears  well-developed and well-nourished. No distress.  HENT:  Head: Normocephalic and atraumatic.  Eyes: Pupils are equal, round, and reactive to light.  Neck: Normal range of motion.  Cardiovascular: Normal rate, regular rhythm and normal heart sounds.   Pulmonary/Chest: CTA bilaterally Abdominal: Soft. Bowel sounds are normal.  Neurological: He is alert and oriented to person, place, and time.  Skin: Skin is warm and dry.  Psychiatric: He has a normal mood and affect. His behavior is normal. Judgment and thought content normal. MSK:  No visibly warm or swollen joints on exam     Assessment & Plan:  Patient ID: Edward Wolf, male   DOB: 08-17-1946, 68 y.o.   MRN: [id number]

## 2014-12-01 NOTE — Assessment & Plan Note (Signed)
Well controlled if her follows diet- discussed low purine diet with pt again today.

## 2014-12-14 ENCOUNTER — Telehealth: Payer: Self-pay

## 2014-12-14 MED ORDER — SILDENAFIL CITRATE 20 MG PO TABS: ORAL_TABLET | ORAL | Status: DC

## 2014-12-14 NOTE — Telephone Encounter (Signed)
eRx sent

## 2014-12-14 NOTE — Telephone Encounter (Signed)
Pt left v/m; pt discussed with Dr Deborra Medina at [facility] appt about prescribing viagra; pt request viagra rx sent to [facility]. Cialis is on med list.Please advise.

## 2014-12-14 NOTE — Telephone Encounter (Signed)
I always forget exactly which dosage [place] will cover and how many tablets.  Do you mind verifying this again for me..sorry!  I promise to write it down this time.

## 2014-12-14 NOTE — Telephone Encounter (Signed)
#  50 for $80 and Rx must be generic

## 2015-01-10 ENCOUNTER — Other Ambulatory Visit: Payer: Self-pay | Admitting: Internal Medicine

## 2015-01-11 NOTE — Telephone Encounter (Signed)
Last filled 11/26/13 #180 with 1 refill--please advise

## 2015-02-06 ENCOUNTER — Other Ambulatory Visit: Payer: Self-pay | Admitting: Family Medicine

## 2015-03-03 ENCOUNTER — Other Ambulatory Visit: Payer: Self-pay | Admitting: Family Medicine

## 2015-03-28 ENCOUNTER — Other Ambulatory Visit: Payer: Self-pay | Admitting: Family Medicine

## 2015-06-06 ENCOUNTER — Other Ambulatory Visit: Payer: Self-pay | Admitting: Family Medicine

## 2015-07-14 ENCOUNTER — Other Ambulatory Visit: Payer: Self-pay | Admitting: Family Medicine

## 2015-07-20 ENCOUNTER — Other Ambulatory Visit: Payer: Self-pay | Admitting: Family Medicine

## 2015-09-09 ENCOUNTER — Other Ambulatory Visit: Payer: Self-pay | Admitting: Family Medicine

## 2015-10-31 ENCOUNTER — Telehealth: Payer: Self-pay

## 2015-10-31 MED ORDER — SIMVASTATIN 40 MG PO TABS: 40.0000 mg | ORAL_TABLET | Freq: Every day | ORAL | Status: DC

## 2015-10-31 NOTE — Telephone Encounter (Signed)
Let's give him enough to get to OV only.

## 2015-10-31 NOTE — Telephone Encounter (Signed)
Attempted to contact pt. Line d/c. Rx has been sent to pharmacy

## 2015-10-31 NOTE — Telephone Encounter (Signed)
Pt left v/m requesting refill simvastatin to [facility]. [facility] and [facility] last got Simvastatin filled in May 2016 and prescribing physician was Dr Lloyd Huger. Dr Deborra Medina has never filled rx and last lipid labs were 07/13/2013. Pt last seen 12/01/14 and pt has upcoming appt on 11/08/15.Please advise. Pt request cb.

## 2015-10-31 NOTE — Telephone Encounter (Signed)
Pt left v/m requesting cb for status of simvastatin refill.

## 2015-11-06 ENCOUNTER — Other Ambulatory Visit: Payer: Self-pay | Admitting: Family Medicine

## 2015-11-08 ENCOUNTER — Encounter: Payer: Self-pay | Admitting: Family Medicine

## 2015-11-08 ENCOUNTER — Encounter (INDEPENDENT_AMBULATORY_CARE_PROVIDER_SITE_OTHER): Payer: Self-pay

## 2015-11-08 ENCOUNTER — Ambulatory Visit (INDEPENDENT_AMBULATORY_CARE_PROVIDER_SITE_OTHER): Payer: Federal, State, Local not specified - PPO | Admitting: Family Medicine

## 2015-11-08 VITALS — BP 178/93 | HR 68 | Temp 98.2°F | Wt 216.0 lb

## 2015-11-08 DIAGNOSIS — I10 Essential (primary) hypertension: Secondary | ICD-10-CM | POA: Diagnosis not present

## 2015-11-08 DIAGNOSIS — E785 Hyperlipidemia, unspecified: Secondary | ICD-10-CM | POA: Diagnosis not present

## 2015-11-08 DIAGNOSIS — E876 Hypokalemia: Secondary | ICD-10-CM

## 2015-11-08 DIAGNOSIS — Z125 Encounter for screening for malignant neoplasm of prostate: Secondary | ICD-10-CM

## 2015-11-08 DIAGNOSIS — M109 Gout, unspecified: Secondary | ICD-10-CM

## 2015-11-08 DIAGNOSIS — M10079 Idiopathic gout, unspecified ankle and foot: Secondary | ICD-10-CM

## 2015-11-08 LAB — LIPID PANEL
CHOL/HDL RATIO: 5
CHOLESTEROL: 178 mg/dL (ref 0–200)
HDL: 36.3 mg/dL — ABNORMAL LOW (ref >39.00)
LDL Cholesterol: 114 mg/dL — ABNORMAL HIGH (ref 0–99)
NonHDL: 141.45
TRIGLYCERIDES: 139 mg/dL (ref 0.0–149.0)
VLDL: 27.8 mg/dL (ref 0.0–40.0)

## 2015-11-08 LAB — CBC WITH DIFFERENTIAL/PLATELET
BASOS ABS: 0 10*3/uL (ref 0.0–0.1)
Basophils Relative: 0.5 % (ref 0.0–3.0)
Eosinophils Absolute: 0.4 10*3/uL (ref 0.0–0.7)
Eosinophils Relative: 5.1 % — ABNORMAL HIGH (ref 0.0–5.0)
HCT: 44 % (ref 39.0–52.0)
Hemoglobin: 15.2 g/dL (ref 13.0–17.0)
LYMPHS ABS: 2.8 10*3/uL (ref 0.7–4.0)
LYMPHS PCT: 34.8 % (ref 12.0–46.0)
MCHC: 34.5 g/dL (ref 30.0–36.0)
MCV: 92.7 fl (ref 78.0–100.0)
MONOS PCT: 9.2 % (ref 3.0–12.0)
Monocytes Absolute: 0.8 10*3/uL (ref 0.1–1.0)
NEUTROS PCT: 50.4 % (ref 43.0–77.0)
Neutro Abs: 4.1 10*3/uL (ref 1.4–7.7)
Platelets: 253 10*3/uL (ref 150.0–400.0)
RBC: 4.75 Mil/uL (ref 4.22–5.81)
RDW: 13.9 % (ref 11.5–15.5)
WBC: 8.2 10*3/uL (ref 4.0–10.5)

## 2015-11-08 LAB — COMPREHENSIVE METABOLIC PANEL
ALK PHOS: 75 U/L (ref 39–117)
ALT: 19 U/L (ref 0–53)
AST: 16 U/L (ref 0–37)
Albumin: 4.5 g/dL (ref 3.5–5.2)
BILIRUBIN TOTAL: 0.9 mg/dL (ref 0.2–1.2)
BUN: 12 mg/dL (ref 6–23)
CO2: 28 mEq/L (ref 19–32)
CREATININE: 0.88 mg/dL (ref 0.40–1.50)
Calcium: 9.7 mg/dL (ref 8.4–10.5)
Chloride: 102 mEq/L (ref 96–112)
GFR: 110.62 mL/min (ref >60.00)
GLUCOSE: 91 mg/dL (ref 70–99)
Potassium: 2.9 mEq/L — ABNORMAL LOW (ref 3.5–5.1)
Sodium: 142 mEq/L (ref 135–145)
TOTAL PROTEIN: 7.3 g/dL (ref 6.0–8.3)

## 2015-11-08 LAB — PSA: PSA: 19.98 ng/mL — ABNORMAL HIGH (ref 0.10–4.00)

## 2015-11-08 MED ORDER — ALLOPURINOL 300 MG PO TABS: ORAL_TABLET | ORAL | Status: DC

## 2015-11-08 MED ORDER — SIMVASTATIN 40 MG PO TABS: 40.0000 mg | ORAL_TABLET | Freq: Every day | ORAL | Status: DC

## 2015-11-08 NOTE — Progress Notes (Signed)
Subjective:    Patient ID: Edward Wolf, male    DOB: 15-Dec-1945, 69 y.o.   MRN: [id number]  HPI  Very pleasant 69 yo male here for follow up.  HTN- Taking Lisinopril-HCTZ 40-25 mg daily.  BP is extremely elevated today.    Denies any HA, blurred vision, CP or SOB.  Just took his Rx prior to coming here. Checks BP at home regularly, usually runs 115-120s/70-80s. BP Readings from Last 3 Encounters:  11/08/15 178/93  12/01/14 132/82  08/24/14 142/88     Hypokalemia- On Kdur 30 meq daily.   Lab Results  Component Value Date   NA 138 12/01/2014   K 3.3* 12/01/2014   CL 103 12/01/2014   CO2 29 12/01/2014   Gout- taking allopurinol daily.  If he follows "the right diet," he does well.  .  Having "small flares" in foot/hip. Drinking more alcohol due to the holidays.  Likes red meat as well.  HLD- taking zocor 40 mg daily. Denies myalgias.  Patient Active Problem List   Diagnosis Date Noted  . Lower urinary tract symptoms (LUTS) 12/29/2013  . Gout 12/29/2013  . Hypokalemia 12/29/2013  . Erectile dysfunction 12/29/2013  . Hyperlipidemia   . Hypertension    Past Medical History  Diagnosis Date  . Asthma   . Arthritis   . Hay fever   . Colon polyps   . Positive TB test   . Hyperlipidemia   . Hypertension    Past Surgical History  Procedure Laterality Date  . Wrist surgery  1964  . Hernia repair  1998  . Colonoscopy    . Tooth extraction     Social History  Substance Use Topics  . Smoking status: Current Every Day Smoker    Types: Cigars  . Smokeless tobacco: Never Used  . Alcohol Use: Yes     Comment: occasional   Family History  Problem Relation Age of Onset  . Arthritis Mother   . Cancer Mother   . Hyperlipidemia Mother   . Heart disease Mother   . Hypertension Mother   . Arthritis Father   . Cancer Father   . Hyperlipidemia Father   . Heart disease Father   . Hypertension Father   . Hyperlipidemia Sister   . Hypertension Sister   . Heart  disease Sister   . Arthritis Maternal Grandmother   . Arthritis Maternal Grandfather   . Arthritis Paternal Grandmother   . Arthritis Paternal Grandfather    No Known Allergies Current Outpatient Prescriptions on File Prior to Visit  Medication Sig Dispense Refill  . albuterol (PROVENTIL HFA) 108 (90 BASE) MCG/ACT inhaler Inhale 2 puffs into the lungs 4 (four) times daily as needed for wheezing or shortness of breath.    . ketoconazole (NIZORAL) 2 % cream Apply 1 application topically daily. 15 g 1  . KLOR-CON M10 10 MEQ tablet TAKE 3 TABLETS (30 MEQ TOTAL) BY MOUTH DAILY. 90 tablet 3  . lisinopril-hydrochlorothiazide (PRINZIDE,ZESTORETIC) 20-12.5 MG per tablet TAKE 2 TABLETS BY MOUTH DAILY. 60 tablet 3  . minocycline (MINOCIN,DYNACIN) 100 MG capsule TAKE 1 CAPSULE BY MOUTH TWICE DAILY 180 capsule 0  . sildenafil (REVATIO) 20 MG tablet TAKE 1-2 TABLETS AS NEEDED ONE HOUR PRIOR TO INTERCOURSE 50 tablet 0   No current facility-administered medications on file prior to visit.   The PMH, PSH, Social History, Family History, Medications, and allergies have been reviewed in [facility], and have been updated if relevant.   Review of Systems  Constitutional: Negative.   HENT: Negative.   Eyes: Negative.   Respiratory: Negative.   Cardiovascular: Negative.   Gastrointestinal: Negative.   Endocrine: Negative.   Musculoskeletal: Negative.   Skin: Negative.   Allergic/Immunologic: Negative.   Neurological: Negative.   Hematological: Negative.   Psychiatric/Behavioral: Negative.   All other systems reviewed and are negative.       Objective:   Physical Exam  BP 178/93 mmHg  Pulse 68  Temp(Src) 98.2 F (36.8 C) (Oral)  Wt 216 lb (97.977 kg)  SpO2 98% Wt Readings from Last 3 Encounters:  11/08/15 216 lb (97.977 kg)  12/01/14 227 lb 8 oz (103.193 kg)  08/24/14 218 lb 8 oz (99.111 kg)    Constitutional: He is oriented to person, place, and time. He appears well-developed and  well-nourished. No distress.  HENT:  Head: Normocephalic and atraumatic.  Eyes: Pupils are equal, round, and reactive to light.  Neck: Normal range of motion.  Cardiovascular: Normal rate, regular rhythm and normal heart sounds.   Pulmonary/Chest: CTA bilaterally Abdominal: Soft. Bowel sounds are normal.  Neurological: He is alert and oriented to person, place, and time.  Skin: Skin is warm and dry.  Psychiatric: He has a normal mood and affect. His behavior is normal. Judgment and thought content normal. MSK:  No visibly warm or swollen joints on exam     Assessment & Plan:  Patient ID: Edward Wolf, male   DOB: 1946-03-13, 69 y.o.   MRN: [id number]

## 2015-11-08 NOTE — Assessment & Plan Note (Signed)
No recent severe flares.  Admits to not having a very gout friendly diet.

## 2015-11-08 NOTE — Patient Instructions (Signed)
Great to see you. I will call you with your lab results.   

## 2015-11-08 NOTE — Assessment & Plan Note (Signed)
Due for labs today. Continue current rx. No changes made today.

## 2015-11-08 NOTE — Assessment & Plan Note (Signed)
Deteriorated but improving by end of visit- 158/90. He is asymptomatic and normotensive at home per pt. Continue to monitor.

## 2015-11-08 NOTE — Progress Notes (Signed)
Pre visit review using our clinic review tool, if applicable. No additional management support is needed unless otherwise documented below in the visit note. 

## 2015-11-08 NOTE — Assessment & Plan Note (Signed)
Continue current dose of potassium. Check labs today.

## 2015-11-09 ENCOUNTER — Other Ambulatory Visit: Payer: Self-pay | Admitting: Family Medicine

## 2015-11-09 DIAGNOSIS — R972 Elevated prostate specific antigen [PSA]: Secondary | ICD-10-CM

## 2015-11-14 ENCOUNTER — Ambulatory Visit: Payer: Self-pay | Admitting: Obstetrics and Gynecology

## 2015-11-15 ENCOUNTER — Ambulatory Visit (INDEPENDENT_AMBULATORY_CARE_PROVIDER_SITE_OTHER): Payer: Federal, State, Local not specified - PPO | Admitting: Urology

## 2015-11-15 ENCOUNTER — Encounter: Payer: Self-pay | Admitting: Urology

## 2015-11-15 ENCOUNTER — Telehealth: Payer: Self-pay | Admitting: Family Medicine

## 2015-11-15 VITALS — BP 185/111 | HR 86 | Ht 75.0 in | Wt 221.1 lb

## 2015-11-15 DIAGNOSIS — R972 Elevated prostate specific antigen [PSA]: Secondary | ICD-10-CM | POA: Diagnosis not present

## 2015-11-15 LAB — URINALYSIS, COMPLETE
Bilirubin, UA: NEGATIVE
Glucose, UA: NEGATIVE
Ketones, UA: NEGATIVE
Leukocytes, UA: NEGATIVE
Nitrite, UA: NEGATIVE
Protein, UA: NEGATIVE
Specific Gravity, UA: 1.015 (ref 1.005–1.030)
Urobilinogen, Ur: 0.2 mg/dL (ref 0.2–1.0)
pH, UA: 7 (ref 5.0–7.5)

## 2015-11-15 LAB — MICROSCOPIC EXAMINATION
Bacteria, UA: NONE SEEN
Epithelial Cells (non renal): NONE SEEN /hpf (ref 0–10)
WBC, UA: NONE SEEN /hpf (ref 0–?)

## 2015-11-15 NOTE — Progress Notes (Signed)
11/15/2015 5:02 PM   Edward Wolf [birth year] [id number]  Referring provider: Lucille Passy, MD [place],  [id number]  Chief Complaint  Patient presents with  . Elevated PSA    New Patient    HPI: 69 yo M referred for elevated PSA to 19.98 on 11/08/15.    Values prior to this where 1.29 on 12/2011, 04/2013 on 4.42, and 3.88 on 07/2013.    There are no PSA values during the year 2015.    No family history of prostate cancer.    No personal history of elevated PSA prior today.     He denies any bone pain or weight loss.  He has no urinary complaints including gross hematuria, nocturia, frequency, urgency, or incomplete bladder emptying.  He does admit that sometimes he does have a weak stream just first thing in the morning after a night of drinking.     PMH: Past Medical History  Diagnosis Date  . Asthma   . Arthritis   . Hay fever   . Colon polyps   . Positive TB test   . Hyperlipidemia   . Hypertension     Surgical History: Past Surgical History  Procedure Laterality Date  . Wrist surgery  1964  . Hernia repair  1998  . Colonoscopy    . Tooth extraction      Home Medications:    Medication List       This list is accurate as of: 11/15/15  5:02 PM.  Always use your most recent med list.               allopurinol 300 MG tablet  Commonly known as:  ZYLOPRIM  TAKE 1 TABLET (300 MG TOTAL) BY MOUTH DAILY.     ketoconazole 2 % cream  Commonly known as:  NIZORAL  Apply 1 application topically daily.     KLOR-CON M10 10 MEQ tablet  Generic drug:  potassium chloride  TAKE 3 TABLETS (30 MEQ TOTAL) BY MOUTH DAILY.     lisinopril-hydrochlorothiazide 20-12.5 MG tablet  Commonly known as:  PRINZIDE,ZESTORETIC  TAKE 2 TABLETS BY MOUTH DAILY.     minocycline 100 MG capsule  Commonly known as:  MINOCIN,DYNACIN  TAKE 1 CAPSULE BY MOUTH TWICE DAILY     PROVENTIL HFA 108 (90 BASE) MCG/ACT inhaler  Generic drug:  albuterol  Inhale 2 puffs  into the lungs 4 (four) times daily as needed for wheezing or shortness of breath.     sildenafil 20 MG tablet  Commonly known as:  REVATIO  TAKE 1-2 TABLETS AS NEEDED ONE HOUR PRIOR TO INTERCOURSE     simvastatin 40 MG tablet  Commonly known as:  ZOCOR  Take 1 tablet (40 mg total) by mouth at bedtime.        Allergies: No Known Allergies  Family History: Family History  Problem Relation Age of Onset  . Arthritis Mother   . Bladder Cancer Mother   . Hyperlipidemia Mother   . Heart disease Mother   . Hypertension Mother   . Arthritis Father   . Cancer Father   . Hyperlipidemia Father   . Heart disease Father   . Hypertension Father   . Hyperlipidemia Sister   . Hypertension Sister   . Heart disease Sister   . Arthritis Maternal Grandmother   . Arthritis Maternal Grandfather   . Arthritis Paternal Grandmother   . Arthritis Paternal Grandfather   . Prostate cancer Neg Hx  Social History:  reports that he has been smoking Cigars.  He has never used smokeless tobacco. He reports that he drinks alcohol. He reports that he does not use illicit drugs.  ROS: UROLOGY Frequent Urination?: No Hard to postpone urination?: No Burning/pain with urination?: No Get up at night to urinate?: No Leakage of urine?: No Urine stream starts and stops?: No Trouble starting stream?: Yes Do you have to strain to urinate?: No Blood in urine?: No Urinary tract infection?: No Sexually transmitted disease?: No Injury to kidneys or bladder?: No Painful intercourse?: No Weak stream?: Yes Erection problems?: No Penile pain?: No  Gastrointestinal Nausea?: No Vomiting?: No Indigestion/heartburn?: No Diarrhea?: No Constipation?: No  Constitutional Fever: No Night sweats?: No Weight loss?: No Fatigue?: No  Skin Skin rash/lesions?: No Itching?: No  Eyes Blurred vision?: No Double vision?: No  Ears/Nose/Throat Sore throat?: No Sinus problems?:  Yes  Hematologic/Lymphatic Swollen glands?: No Easy bruising?: No  Cardiovascular Leg swelling?: No Chest pain?: No  Respiratory Cough?: No Shortness of breath?: No  Endocrine Excessive thirst?: No  Musculoskeletal Back pain?: No Joint pain?: No  Neurological Headaches?: No Dizziness?: No  Psychologic Depression?: No Anxiety?: No  Physical Exam: BP 185/111 mmHg  Pulse 86  Ht 6\' 3"  (1.905 m)  Wt 221 lb 1.6 oz (100.29 kg)  BMI 27.64 kg/m2  Constitutional:  Alert and oriented, No acute distress. HEENT: [employer] AT, moist mucus membranes.  Trachea midline, no masses. Cardiovascular: No clubbing, cyanosis, or edema. Respiratory: Normal respiratory effort, no increased work of breathing. GI: Abdomen is soft, nontender, nondistended, no abdominal masses GU: No CVA tenderness.  Rectal: Patient became agitated and unexpectedly grabbed MD's forearm at the time of attempted rectal exam in a somewhat threatening manner after initially consenting to the exam.  Chaperone was immediately obtained Mauri Brooklyn, MA) and second attempt at rectal exam was offered, patient refused.      Skin: No rashes, bruises or suspicious lesions.  Boil on the back of thigh. Lymph: No cervical adenopathy. Neurologic: Grossly intact, no focal deficits, moving all 4 extremities. Psychiatric:  Agitated.  Laboratory Data: Lab Results  Component Value Date   WBC 8.2 11/08/2015   HGB 15.2 11/08/2015   HCT 44.0 11/08/2015   MCV 92.7 11/08/2015   PLT 253.0 11/08/2015    Lab Results  Component Value Date   CREATININE 0.88 11/08/2015    Lab Results  Component Value Date   PSA 19.98* 11/08/2015    Urinalysis Results for orders placed or performed in visit on 11/15/15  Microscopic Examination  Result Value Ref Range   WBC, UA None seen 0 -  5 /hpf   RBC, UA 0-2 0 -  2 /hpf   Epithelial Cells (non renal) None seen 0 - 10 /hpf   Bacteria, UA None seen None seen/Few  Urinalysis, Complete   Result Value Ref Range   Specific Gravity, UA 1.015 1.005 - 1.030   pH, UA 7.0 5.0 - 7.5   Color, UA Yellow Yellow   Appearance Ur Clear Clear   Leukocytes, UA Negative Negative   Protein, UA Negative Negative/Trace   Glucose, UA Negative Negative   Ketones, UA Negative Negative   RBC, UA Trace (A) Negative   Bilirubin, UA Negative Negative   Urobilinogen, Ur 0.2 0.2 - 1.0 mg/dL   Nitrite, UA Negative Negative   Microscopic Examination See below:      Pertinent Imaging: n/a  Assessment & Plan:   69 year old male with elevated PSA  to 19.93 from previous 3.38 and 2014. PSA repeated today to ensure that this is a true value.  Patient refused rectal exam today.   We reviewed the implications of an elevated PSA and the uncertainty surrounding it. In general, a man's PSA increases with age and is produced by both normal and cancerous prostate tissue. Differential for elevated PSA is BPH, prostate cancer, infection, recent intercourse/ejaculation, prostate infarction, recent urethroscopic manipulation (foley placement/cystoscopy) and prostatitis. Management of an elevated PSA can include observation or prostate biopsy and wediscussed this in detail. We discussed that indications for prostate biopsy are defined by age and race specific PSA cutoffs as well as a PSA velocity of 0.75/year.  We discussed prostate biopsy in detail including the procedure itself, the risks of blood in the urine, stool, and ejaculate, serious infection, and discomfort.    I recommended today to the patient that if his PSA remains elevated, he proceed with prostate biopsy. We discussed that obtaining tissues the only way to rule out prostate cancer and the cancer when localized is not painful and typically asymptomatic. The purpose of prostate biopsy  was discussed in detail  With the goal of achieving a diagnosis before the cancer spreads and becomes incurable. Ultimately, the goal is to increase life expectancy and  decrease morbidity.   Mr. Bartnik seemed to understand the aforementioned discussion but adamantly refused prostate biopsy. He said that he appreciated my explanation and understood the goals of obtaining tissue , however, he would not be proceeding with biopsy see as he does not think that there is a problem. Again, I reiterated that he would not necessarily know there is a problem and that elevated PSA could be the first and only sign prior to developing metastatic disease.     Mr. Capley asked if we could call him with his repeat PSA results. He would think about proceeding with a biopsy pending those results. We will make our recommendations clear once we have those results.  1. Elevated PSA Repeat PSA pending Will call with results and recommendation - PSA - Urinalysis, Complete   Return for will call with PSA results.  Hollice Espy, MD  [facility] [address], [place], [place] [address] [phone]  I spent 30 min with this patient of which greater than 50% was spent in counseling and coordination of care with the patient.

## 2015-11-15 NOTE — Telephone Encounter (Signed)
Pt called he needs is PSA lab results Also pt had urology appointment in [place].  It wasn't a good appointment  He would like to go to [facility]

## 2015-11-15 NOTE — Telephone Encounter (Signed)
Please call pt for more information- ok to give him PSA results.

## 2015-11-15 NOTE — Telephone Encounter (Signed)
Spoke to Edward Wolf and informed him of results. He states that before he went to urology, he knew his PSA was high but was unaware of the exact number. He states that he saw Dr Erlene Quan  but he was not given an digital exam, but suggested that he do a biopsy. His BP elevated to 185-111 when she explained the biopsy, which was elevated at the beginning of the appt. He was under the impression and thought that she was inferring that he had prostate cancer. He was not pleased at the order and manner in which things were handled. Edward Wolf is wanting to complete all testing and have results sent to Dr Deborra Medina, and then schedule a f/u to discuss further.

## 2015-11-16 ENCOUNTER — Encounter: Payer: Self-pay | Admitting: Family Medicine

## 2015-11-16 ENCOUNTER — Telehealth: Payer: Self-pay | Admitting: Family Medicine

## 2015-11-16 ENCOUNTER — Telehealth: Payer: Self-pay | Admitting: Urology

## 2015-11-16 ENCOUNTER — Telehealth: Payer: Self-pay | Admitting: *Deleted

## 2015-11-16 DIAGNOSIS — R972 Elevated prostate specific antigen [PSA]: Secondary | ICD-10-CM

## 2015-11-16 LAB — PSA: Prostate Specific Ag, Serum: 10.9 ng/mL — ABNORMAL HIGH (ref 0.0–4.0)

## 2015-11-16 NOTE — Telephone Encounter (Signed)
Received a phone call from Dr. Erlene Quan, urology.  Had a difficult patient interaction with Mr. Gagnier- he actually grabbed her arm.  She no longer wants to see him but states that he can see another provider in her office.  She repeated his PSA which was 10 and does strongly want him to proceed with prostate biopsy, as do I.   Referral has already been placed to another urologist per pt request yesterday.  Spoke with pt- he is refusing biopsy.  He is very angry that we are both advising a prostate biopsy.  He is very angry - yelled at me for 15 minutes about how we have all treated him.  I apologized that he is upset with his experience or with the idea of a biopsy and will refer him to another urologist.    Will route to [facility]- he may need to see another provider in our practice.

## 2015-11-16 NOTE — Telephone Encounter (Signed)
PSA remains elevated at 10.  Recommend prostate biopsy.  Given patient's agitation in the office and well established relationship with Dr. Deborra Medina (PCP), discussed case directly with her over the phone this morning.  She has agreed to call the patient to discuss it directly with him and convey our recommendations.    If he is willing to have biopsy, he can be scheduled with a different provider in our office or be referred to another practice in [place] if that makes him more comfortable.  Goal is to make sure the patient gets the care that he needs.   Hollice Espy, MD

## 2015-11-16 NOTE — Telephone Encounter (Signed)
i spoke to this pt on 12/06. no matter whether its in office or on the phone, pt is always rude and demanding. At his last OV, I asked him to repeat himself, as he was speaking to me while I was taking his BP and was unable to hear him. His repoy was "nevermind you just don't listen, do you?" Yesterday, while on the phone, I had to place him on a brief hold, while I investigated his chart. While on hold, he d/c the call.  I called him back. He asked if I was the same person he deals with in the office, and I replied, "yes." He asked me for my name and when i gave it to him he wanted my last name.(which i dont have to give him per Barnett Applebaum)  He saw urology, and she explained the entire biopsy procedure to him before doing the exam, which made him think she was inferring he had cancer. HE IS NOT HAPPY ABOUT IT! I advised pt that I would forward his message to Dr Deborra Medina for her to review. I advised him that I would contact him back once Dr Deborra Medina had a chance to respond.

## 2015-11-17 ENCOUNTER — Telehealth: Payer: Self-pay | Admitting: Family Medicine

## 2015-11-17 NOTE — Telephone Encounter (Signed)
Patient dismissed from [facility] by Arnette Norris MD , effective November 16, 2015. Dismissal letter sent out by certified / registered mail.  DAJ  Received signed domestic return receipt verifying delivery of certified letter on November 22, 2015. Article number [id number] [place]

## 2015-12-19 ENCOUNTER — Other Ambulatory Visit: Payer: Self-pay

## 2015-12-19 MED ORDER — LISINOPRIL-HYDROCHLOROTHIAZIDE 20-12.5 MG PO TABS: 2.0000 | ORAL_TABLET | Freq: Every day | ORAL | Status: DC

## 2015-12-19 NOTE — Telephone Encounter (Signed)
[facility] left v/m for refill lisinopril-HCTZ; last seen 11/08/15 and BP 178/93; last refilled  # 60 x 3 on 07/14/15.Please advise.

## 2016-04-18 ENCOUNTER — Other Ambulatory Visit: Payer: Self-pay | Admitting: Family Medicine

## 2016-05-08 ENCOUNTER — Other Ambulatory Visit: Payer: Self-pay | Admitting: Family Medicine

## 2016-05-16 ENCOUNTER — Other Ambulatory Visit: Payer: Self-pay | Admitting: Family Medicine

## 2016-05-20 ENCOUNTER — Other Ambulatory Visit: Payer: Self-pay | Admitting: Family Medicine

## 2016-06-21 IMAGING — CR DG CHEST 2V
3 series · 3 of 3 positions shown · non-contrast
Comparison: 04/12/2014

CLINICAL DATA: Progressive wheezing, smoker

EXAM:
CHEST  2 VIEW

[view not recorded (1 of 3)]
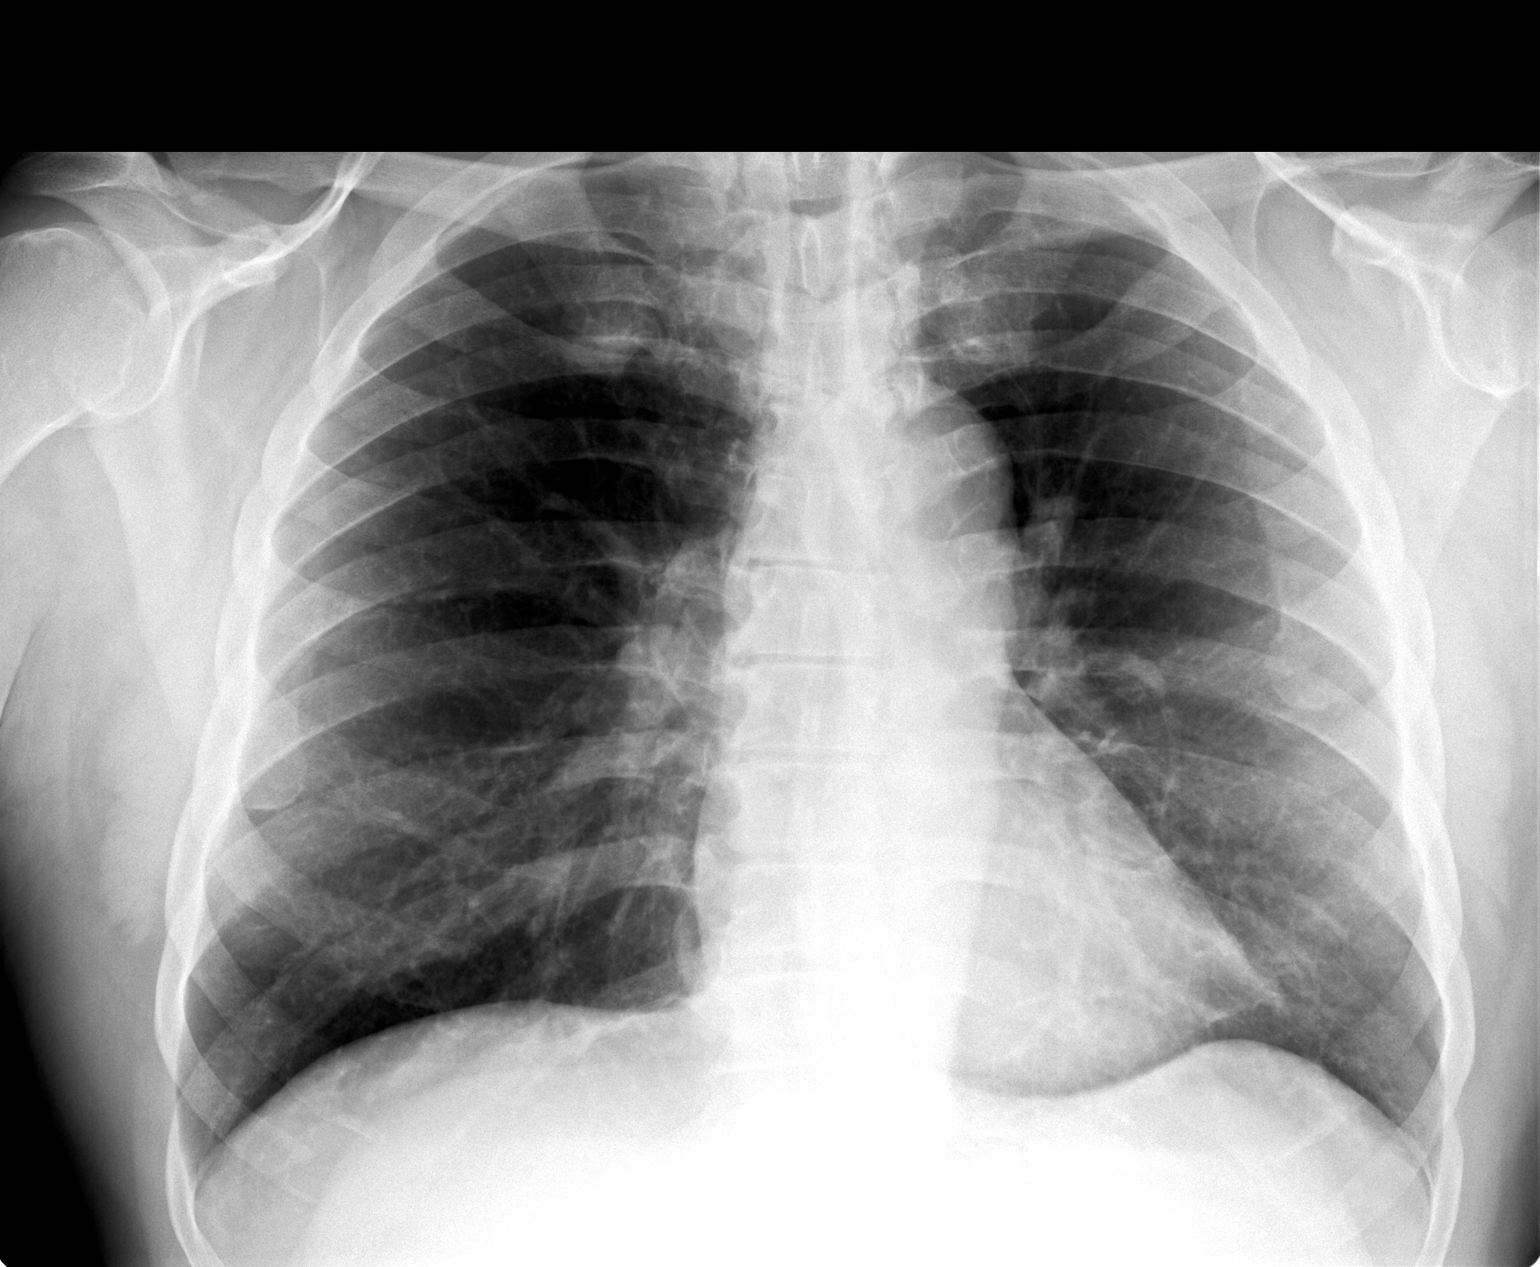

[view not recorded (2 of 3)]
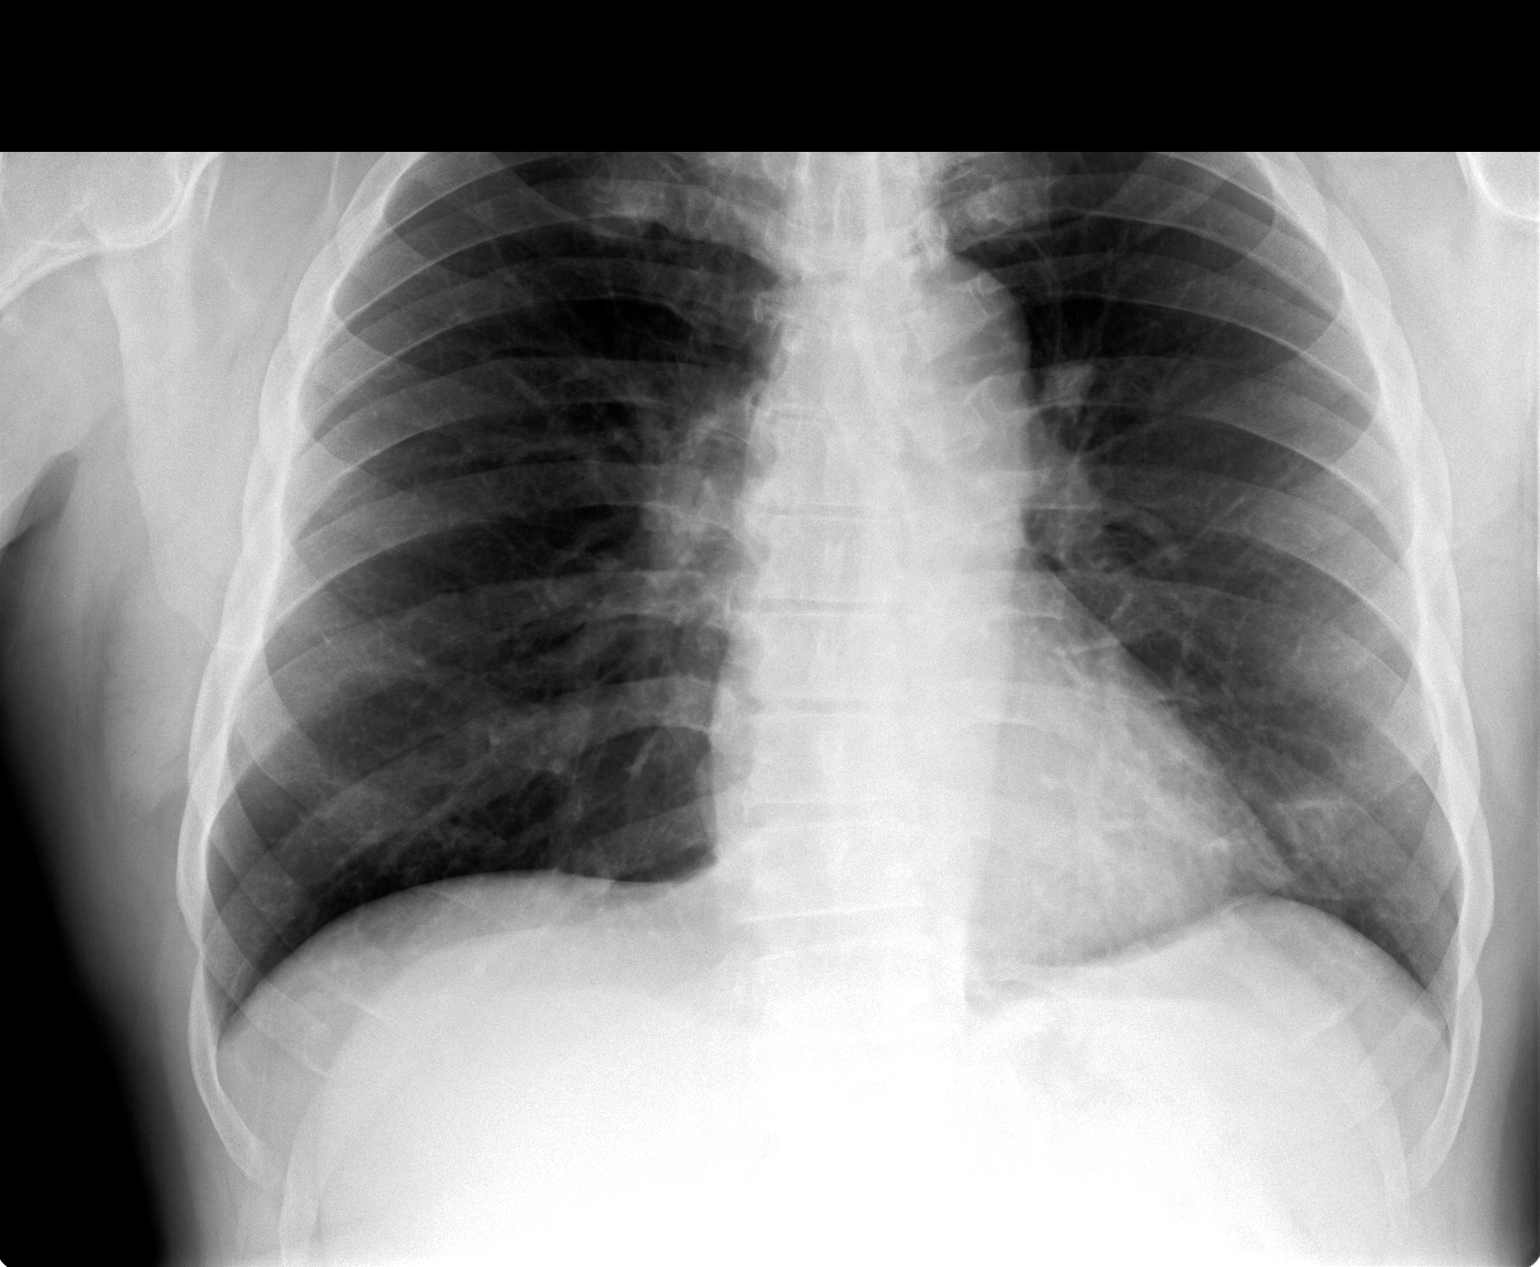

[view not recorded (3 of 3)]
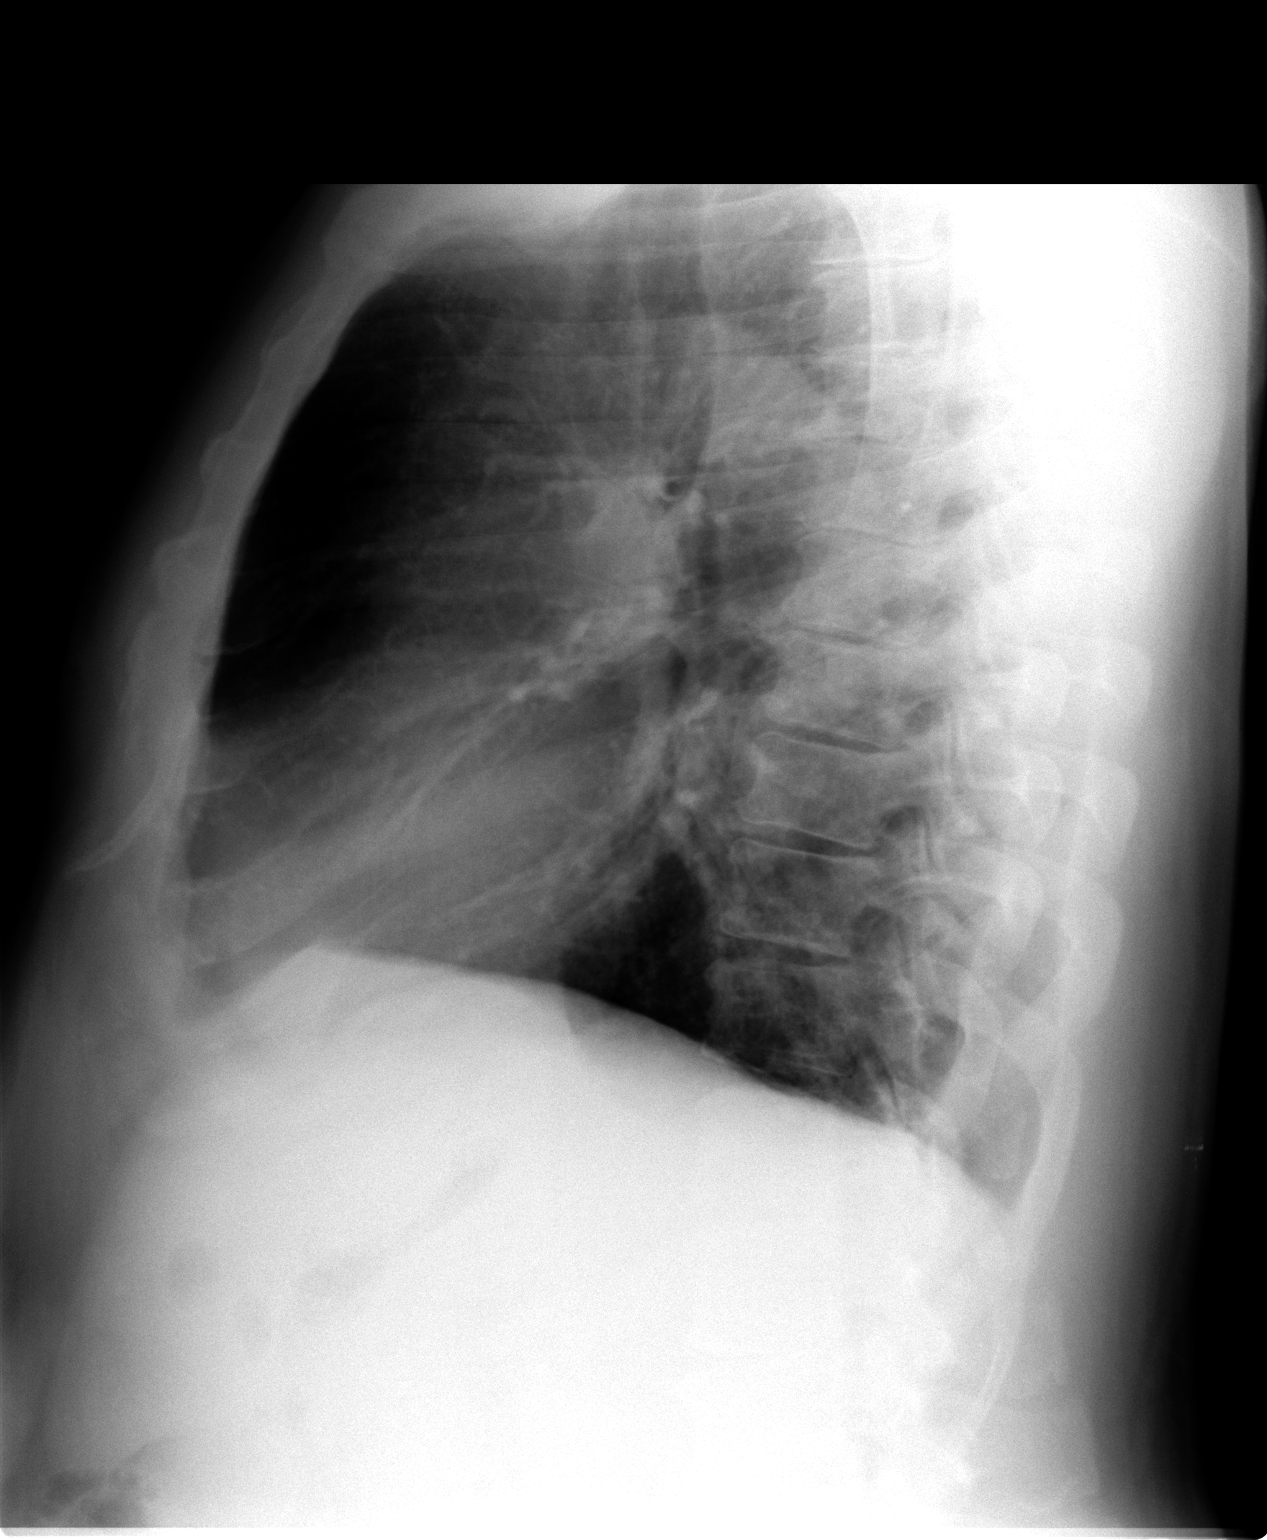

[3 of 3 positions shown; findings below may reference images not displayed]

FINDINGS: The heart size and vascular pattern are normal. Lungs are clear. No
consolidation or effusion. There is mild hyperinflation suggesting
an element of COPD.
IMPRESSION: Mild COPD.  No acute findings.

## 2017-06-25 ENCOUNTER — Ambulatory Visit (INDEPENDENT_AMBULATORY_CARE_PROVIDER_SITE_OTHER): Payer: Self-pay

## 2017-06-25 ENCOUNTER — Encounter (INDEPENDENT_AMBULATORY_CARE_PROVIDER_SITE_OTHER): Payer: Self-pay | Admitting: Orthopaedic Surgery

## 2017-06-25 ENCOUNTER — Ambulatory Visit (INDEPENDENT_AMBULATORY_CARE_PROVIDER_SITE_OTHER): Payer: Federal, State, Local not specified - PPO | Admitting: Orthopaedic Surgery

## 2017-06-25 DIAGNOSIS — M79672 Pain in left foot: Secondary | ICD-10-CM

## 2017-06-25 DIAGNOSIS — M79671 Pain in right foot: Secondary | ICD-10-CM | POA: Diagnosis not present

## 2017-06-25 NOTE — Progress Notes (Signed)
Office Visit Note   Patient: Edward Wolf           Date of Birth: [birth year]           MRN: [id number] Visit Date: 06/25/2017              Requested by: No referring provider defined for this encounter. PCP: Lucianne Lei, MD   Assessment & Plan: Visit Diagnoses:  1. Pain in both feet     Plan: Overall impression is bilateral lower extremity edema. TED hose prescription was provided today. I reassured him that he does not have bunion. The callus is impressive and I offered to get him a custom orthotic which she will think about. Questions encouraged and answered. Follow-up as needed.  Follow-Up Instructions: Return if symptoms worsen or fail to improve.   Orders:  Orders Placed This Encounter  Procedures  . XR Foot Complete Left  . XR Foot Complete Right   No orders of the defined types were placed in this encounter.     Procedures: No procedures performed   Clinical Data: No additional findings.   Subjective: Chief Complaint  Patient presents with  . Right Ankle - Pain, Edema  . Left Foot - Pain, Edema    Patient is a 71 year old gentleman with bilateral foot and ankle pain for about 2 months. He states his main complaint is swelling in his feet. He also has a large callus at the bottom of his first MTP joint. He denies any numbness or tingling. There is some throbbing pain in his foot. Swelling is worse at night and better in the morning.    Review of Systems  Constitutional: Negative.   All other systems reviewed and are negative.    Objective: Vital Signs: There were no vitals taken for this visit.  Physical Exam  Constitutional: He is oriented to person, place, and time. He appears well-developed and well-nourished.  HENT:  Head: Normocephalic and atraumatic.  Eyes: Pupils are equal, round, and reactive to light.  Neck: Neck supple.  Pulmonary/Chest: Effort normal.  Abdominal: Soft.  Musculoskeletal: Normal range of motion.  Neurological: He is  alert and oriented to person, place, and time.  Skin: Skin is warm.  Psychiatric: He has a normal mood and affect. His behavior is normal. Judgment and thought content normal.  Nursing note and vitals reviewed.   Ortho Exam Bilateral foot and lower extremity exam shows pitting edema.  Feet are warm well-perfused. Sensation is intact. He does not have any skin changes other than the callus on the plantar aspect of his first metatarsal head. No evidence of infection. Specialty Comments:  No specialty comments available.  Imaging: Xr Foot Complete Left  Result Date: 06/25/2017 No evidence of hallux valgus.  Xr Foot Complete Right  Result Date: 06/25/2017 No evidence of hallux valgus. Advanced hallux rigidus    PMFS History: Patient Active Problem List   Diagnosis Date Noted  . Lower urinary tract symptoms (LUTS) 12/29/2013  . Gout 12/29/2013  . Hypokalemia 12/29/2013  . Erectile dysfunction 12/29/2013  . Hyperlipidemia   . Hypertension    Past Medical History:  Diagnosis Date  . Arthritis   . Asthma   . Colon polyps   . Hay fever   . Hyperlipidemia   . Hypertension   . Positive TB test     Family History  Problem Relation Age of Onset  . Arthritis Mother   . Bladder Cancer Mother   . Hyperlipidemia Mother   .  Heart disease Mother   . Hypertension Mother   . Arthritis Father   . Cancer Father   . Hyperlipidemia Father   . Heart disease Father   . Hypertension Father   . Hyperlipidemia Sister   . Hypertension Sister   . Heart disease Sister   . Arthritis Maternal Grandmother   . Arthritis Maternal Grandfather   . Arthritis Paternal Grandmother   . Arthritis Paternal Grandfather   . Prostate cancer Neg Hx     Past Surgical History:  Procedure Laterality Date  . COLONOSCOPY    . HERNIA REPAIR  1998  . TOOTH EXTRACTION    . WRIST SURGERY  1964   Social History   Occupational History  . Not on file.   Social History Main Topics  . Smoking status:  Current Every Day Smoker    Types: Cigars  . Smokeless tobacco: Never Used  . Alcohol use Yes     Comment: occasional  . Drug use: No  . Sexual activity: Yes

## 2017-07-12 ENCOUNTER — Ambulatory Visit: Payer: Federal, State, Local not specified - PPO | Admitting: Podiatry

## 2017-07-22 ENCOUNTER — Ambulatory Visit (INDEPENDENT_AMBULATORY_CARE_PROVIDER_SITE_OTHER): Payer: Federal, State, Local not specified - PPO | Admitting: Podiatry

## 2017-07-22 ENCOUNTER — Encounter: Payer: Self-pay | Admitting: Podiatry

## 2017-07-22 VITALS — BP 154/76 | HR 85 | Resp 18

## 2017-07-22 DIAGNOSIS — Q828 Other specified congenital malformations of skin: Secondary | ICD-10-CM | POA: Diagnosis not present

## 2017-07-22 DIAGNOSIS — R609 Edema, unspecified: Secondary | ICD-10-CM | POA: Diagnosis not present

## 2017-07-22 DIAGNOSIS — L0291 Cutaneous abscess, unspecified: Secondary | ICD-10-CM

## 2017-07-22 MED ORDER — AMOXICILLIN-POT CLAVULANATE 875-125 MG PO TABS: 1.0000 | ORAL_TABLET | Freq: Two times a day (BID) | ORAL | 0 refills | Status: DC

## 2017-07-22 NOTE — Progress Notes (Signed)
   Subjective:    Patient ID: Edward Wolf, male    DOB: [birth year], 71 y.o.   MRN: [id number]  HPI  71 year old male presents the ossific concerns of swelling to both of his feet with the left side worse than the right. He is having calluses to both the supra in ongoing for some time. He states he appears oozing orthopedics and x-rays taken which were apparently negative. His been seen as primary care physician he is put on medication for the swelling. He does presents today as well for evaluation. He has noticed since the foot is becoming more swollen he has had an increase in swelling to the left foot he points to submetatarsal one when he is a callus. He said no recent treatment for this. He is not currently on any antibiotics. He denies any recent injury or trauma to his feet he denies stepping on any foreign objects. He has no other concerns today.  Review of Systems  Cardiovascular: Positive for leg swelling.  Musculoskeletal: Positive for gait problem.  All other systems reviewed and are negative.      Objective:   Physical Exam General: AAO x3, NAD  Dermatological:   minimal hyperkeratotic tissue is present left submetatarsal 1. Underneath this area does appear to be an area of fluid, fluctuance with there is no crepitus. This appears to be very localized. I was able to aspirate this area and there is purulent material. See the picture below. There is also punctate annular hyperkeratotic lesions bilateral submetatarsal 3. No known ulceration, drainage or any signs of infection. Is no other open lesions or pre-ulcerative lesions of the foot this time.   Vascular: Dorsalis Pedis artery and Posterior Tibial artery pedal pulses are 2/4 bilateral with immedate capillary fill time. There is no pain with calf compression, swelling, warmth, erythema. Swelling to bilateral feet  Neruologic: Grossly intact via light touch bilateral. Protective threshold with Semmes Wienstein monofilament intact  to all pedal sites bilateral.   Musculoskeletal:  Tenderness hyperkeratotic lesions. No other areas of tenderness.Muscular strength 5/5 in all groups tested bilateral.  Gait: Unassisted, Nonantalgic.         Assessment & Plan:  71 year old male bilateral hyperkeratotic lesions with abscess left foot -Treatment options discussed including all alternatives, risks, and complications -Etiology of symptoms were discussed -Able to see prior x-rays which did not show any evidence of acute fracture or osteomyelitis. -Due to the area fluid left foot some metatarsal 1 have recommended aspiration this area to see when getting fluid off. She risks and complications he wished to proceed with the procedure. Under standard conditions a mixture of lidocaine and Marcaine plain was infiltrated in a regional block fashion second metatarsal 1. Once anesthetized the skin was prepped in sterile fashion. A #18-gauge needle was utilized to aspirate material from this area appeared to be thick, brown fluid consistent with purulence. I did send this for culture as well as pathology. A bandage was applied. -Prescribed Augmentin. -Given swelling also recommended venous reflux studies were ordered. -I also debrided the hyperkeratotic lesion/porokeratosis bilaterally. -RTC as scheduled or sooner if needed.  Celesta Gentile, DPM

## 2017-07-25 ENCOUNTER — Other Ambulatory Visit: Payer: Self-pay

## 2017-07-25 DIAGNOSIS — R609 Edema, unspecified: Secondary | ICD-10-CM

## 2017-07-25 DIAGNOSIS — L0291 Cutaneous abscess, unspecified: Secondary | ICD-10-CM

## 2017-07-25 NOTE — Progress Notes (Signed)
Venous reflux orders faxed to [facility]

## 2017-08-05 ENCOUNTER — Ambulatory Visit (HOSPITAL_COMMUNITY)
Admission: RE | Admit: 2017-08-05 | Discharge: 2017-08-05 | Disposition: A | Discharge status: Home / Self Care | Payer: Federal, State, Local not specified - PPO | Source: Ambulatory Visit | Attending: Internal Medicine | Admitting: Internal Medicine

## 2017-08-05 DIAGNOSIS — R609 Edema, unspecified: Secondary | ICD-10-CM | POA: Diagnosis not present

## 2017-08-05 DIAGNOSIS — L0291 Cutaneous abscess, unspecified: Secondary | ICD-10-CM

## 2017-08-07 ENCOUNTER — Ambulatory Visit: Payer: Federal, State, Local not specified - PPO | Admitting: Podiatry

## 2017-08-08 ENCOUNTER — Ambulatory Visit (INDEPENDENT_AMBULATORY_CARE_PROVIDER_SITE_OTHER): Payer: Federal, State, Local not specified - PPO | Admitting: Podiatry

## 2017-08-08 ENCOUNTER — Encounter: Payer: Self-pay | Admitting: Podiatry

## 2017-08-08 DIAGNOSIS — L0291 Cutaneous abscess, unspecified: Secondary | ICD-10-CM | POA: Diagnosis not present

## 2017-08-08 NOTE — Patient Instructions (Signed)

## 2017-08-09 ENCOUNTER — Telehealth: Payer: Self-pay | Admitting: *Deleted

## 2017-08-09 NOTE — Telephone Encounter (Addendum)
-----   Message from Trula Slade, DPM sent at 08/07/2017  2:56 PM EDT ----- Negative; please let him know. Thanks.08/09/2017-Left message request pt call back for results.08/14/2017-A[facility] - [facility] Phen[facility] was to be taken every 6 or 8 hours. I told Marjory Lies that Dr. Jacqualyn Posey wrote for every 8 hours.

## 2017-08-13 NOTE — Progress Notes (Signed)
Subjective: Mr. Tejada presents to the office today for follow-up evaluation of swelling, fluid to left foot submetatarsal one. He states it is doing better but he can still feel fluid and this foot. Denies any redness or warmth. Denies any recent injury or trauma denies stepping on any foreign objects. No other concerns today. Denies any systemic complaints such as fevers, chills, nausea, vomiting. No acute changes since last appointment, and no other complaints at this time.   Objective: AAO x3, NAD DP/PT pulses palpable bilaterally, CRT less than 3 seconds On the left foot submetatarsal one continues to be an area of fluctuance, fluid. There is no overlying erythema or increase in warmth. There is no ascending cellulitis. This is chronic at this point. Is no clinical signs of infection present otherwise. No open lesions or pre-ulcerative lesions.  No pain with calf compression, swelling, warmth, erythema  Assessment: Sterile abscess left foot  Plan: -All treatment options discussed with the patient including all alternatives, risks, complications.  -Previous culture results were again discussed which did not reveal any growth.  -Given the continuation of fluid I recommended incision and drainage, washout of the area and deeper cultures. I discussed with him the surgery as well as postoperative course. He was going proceed with this. -The incision placement as well as the postoperative course was discussed with the patient. I discussed risks of the surgery which include, but not limited to, infection, bleeding, pain, swelling, need for further surgery, delayed or nonhealing, painful or ugly scar, numbness or sensation changes, over/under correction, recurrence, transfer lesions, further deformity, hardware failure, DVT/PE, loss of toe/foot. Patient understands these risks and wishes to proceed with surgery. The surgical consent was reviewed with the patient all 3 pages were signed. No promises or  guarantees were given to the outcome of the procedure. All questions were answered to the best of my ability. Before the surgery the patient was encouraged to call the office if there is any further questions. The surgery will be performed at the [facility] on an outpatient basis. -Patient encouraged to call the office with any questions, concerns, change in symptoms.   Celesta Gentile, DPM

## 2017-08-14 ENCOUNTER — Encounter: Payer: Self-pay | Admitting: Podiatry

## 2017-08-14 DIAGNOSIS — D492 Neoplasm of unspecified behavior of bone, soft tissue, and skin: Secondary | ICD-10-CM | POA: Diagnosis not present

## 2017-08-14 NOTE — Progress Notes (Signed)
DOS 08/14/17 I&D Lt foot

## 2017-08-14 NOTE — Progress Notes (Signed)
Pre-operative Note  Patient presents to the [facility] today for surgical intervention of the left foot for I&D of chronic abscess/fluid. The surgical consent was reviewed with the patient and we discussed the procedure as well as the postoperative course. I again discussed all alternatives, risks, complications. I answered all of their questions to the best of my ability and they wish to proceed with surgery. No promises or guarantees were given as to the outcome of the surgery.   The surgical consent was signed.   Patient is NPO since midnight.  The patient does not have have a history of blood clots or bleeding disorders.   No further questions.   Celesta Gentile, [place]

## 2017-08-16 ENCOUNTER — Ambulatory Visit (INDEPENDENT_AMBULATORY_CARE_PROVIDER_SITE_OTHER): Payer: Self-pay | Admitting: Podiatry

## 2017-08-16 ENCOUNTER — Encounter: Payer: Self-pay | Admitting: Podiatry

## 2017-08-16 ENCOUNTER — Telehealth: Payer: Self-pay | Admitting: *Deleted

## 2017-08-16 ENCOUNTER — Ambulatory Visit (INDEPENDENT_AMBULATORY_CARE_PROVIDER_SITE_OTHER): Payer: Federal, State, Local not specified - PPO

## 2017-08-16 DIAGNOSIS — L0291 Cutaneous abscess, unspecified: Secondary | ICD-10-CM

## 2017-08-16 NOTE — Telephone Encounter (Signed)
Called patient yesterday and stated that I was calling to check on him after surgery and the patient stated that he was sleepy and was doing fine and would see [place] today at 2:45 pm. Edward Wolf

## 2017-08-16 NOTE — Telephone Encounter (Addendum)
-----   Message from Trula Slade, DPM sent at 08/07/2017  2:56 PM EDT ----- Negative; please let him know. Thanks.08/16/2017-I informed pt of Dr. Leigh Aurora review of results.

## 2017-08-19 ENCOUNTER — Ambulatory Visit (INDEPENDENT_AMBULATORY_CARE_PROVIDER_SITE_OTHER): Payer: Self-pay | Admitting: Podiatry

## 2017-08-19 ENCOUNTER — Encounter: Payer: Self-pay | Admitting: Podiatry

## 2017-08-19 VITALS — BP 119/73 | HR 96 | Temp 98.7°F | Resp 18

## 2017-08-19 DIAGNOSIS — L0291 Cutaneous abscess, unspecified: Secondary | ICD-10-CM

## 2017-08-19 NOTE — Progress Notes (Signed)
Subjective: Edward Wolf is a 71 y.o. is seen today in office s/p left foot I&D preformed on 08/14/2017. He states that "I am doing a lot better since the surgery". He has remained in a cam boot although hopefully get out of this today. He has continue with antibiotics. Denies any systemic complaints such as fevers, chills, nausea, vomiting. No calf pain, chest pain, shortness of breath.   Objective: General: No acute distress, AAOx3  DP/PT pulses palpable 2/4, CRT < 3 sec to all digits.  Protective sensation intact. Motor function intact.  Left foot: Incision is well coapted without any evidence of dehiscence with packing along the central aspect. Upon removal of the packing there was purulence expressed. There is no crepitation. There is no overlying erythema or ascending cellulitis. There is no malodor. There was also thick, white material present. There is chronic swelling bilateral lower extremities but the left foot does appear to be improved compared to prior to surgery. No other areas of tenderness to bilateral lower extremities.  No other open lesions or pre-ulcerative lesions.  No pain with calf compression, swelling, warmth, erythema.   Assessment and Plan:  Status post left foot I&D, doing well with no complications   -Treatment options discussed including all alternatives, risks, and complications -Packing was removed today and there is purulence expressed. Area was irrigated with saline and afterwards there was no further purulence. The areas packed with a form packing. This has been an ongoing issue for several months and there is no clinical signs of infection otherwise as far as erythema. He also states that since having the surgery the swelling to his left foot is improved. -Ice/elevation -Pain medication as needed. -Monitor for any clinical signs or symptoms of infection and DVT/PE and directed to call the office immediately should any occur or go to the ER. -Follow-up on Monday  for dressing change or sooner if any problems arise. In the meantime, encouraged to call the office with any questions, concerns, change in symptoms.   *possible MRI next appointment  Celesta Gentile, DPM

## 2017-08-21 ENCOUNTER — Telehealth: Payer: Self-pay | Admitting: *Deleted

## 2017-08-21 ENCOUNTER — Encounter: Payer: Self-pay | Admitting: Podiatry

## 2017-08-21 ENCOUNTER — Ambulatory Visit (INDEPENDENT_AMBULATORY_CARE_PROVIDER_SITE_OTHER): Payer: Self-pay | Admitting: Podiatry

## 2017-08-21 VITALS — BP 133/74 | HR 91 | Temp 97.6°F | Resp 18

## 2017-08-21 DIAGNOSIS — L0291 Cutaneous abscess, unspecified: Secondary | ICD-10-CM

## 2017-08-21 DIAGNOSIS — R609 Edema, unspecified: Secondary | ICD-10-CM

## 2017-08-21 MED ORDER — DOXYCYCLINE HYCLATE 100 MG PO TABS: 100.0000 mg | ORAL_TABLET | Freq: Two times a day (BID) | ORAL | 0 refills | Status: DC

## 2017-08-21 NOTE — Telephone Encounter (Addendum)
Dr. Jacqualyn Posey ordered home health care for pt, 3 times a week flush wound to left foot with sterile saline, pack with Iodoform, and cover with a dry gauze dressing. [facility] states does accept [place] on case by case basis. Faxed required form, clinicals and demographics to [facility]. [facility] states due to staffing they will be able to start pt's care 0n 08/26/2017. I informed pt and he states he has an appt 08/23/2017 for dressing change in office, I told him to keep that appt.08/29/2017-I asked M. Ozimek the status of pt's [place], that was to start 08/26/2017, pt had not been seen yet. Barnabas Harries states she will check and call back.08/30/2017-[facility] - [facility] states she is opening pt up in their system, and wanted to know if he still needed [place], since pt was having dressing changes in the office. I told Alena, pt did need home health care, that the reason he was coming to the office is because he's [place] was to start 08/26/2017, but did not and I had spoken to Ms. Neill Loft and she stated she would look into the problem, and I asked [facility] if she needed orders, and she said she had the wound care orders.

## 2017-08-21 NOTE — Progress Notes (Signed)
Subjective: Edward Wolf is a 71 y.o. is seen today in office s/p left foot I&D preformed on 08/14/2017. He presents today for dressing change. He states he is doing well he has continue with antibiotics. He remained in the surgical boot.  Denies any systemic complaints such as fevers, chills, nausea, vomiting. No calf pain, chest pain, shortness of breath.   Objective: General: No acute distress, AAOx3  DP/PT pulses palpable 2/4, CRT < 3 sec to all digits.  Protective sensation intact. Motor function intact.  Left foot: Incision is well coapted without any evidence of dehiscence with packing along the central aspect. Upon removal no expression of the wound there was moderate amount of purulence expressed today. After debridement and irrigation of the area there was no further purulence expressed. There is no overlying erythema or increase in warmth is no crepitation. There is no ascending cellulitis. No other clinical signs of infection present.  No other open lesions or pre-ulcerative lesions.  No pain with calf compression, swelling, warmth, erythema.   Assessment and Plan:  Status post left foot I&D, doing well with no complications   -Treatment options discussed including all alternatives, risks, and complications -Packing was removed today and there is purulence expressed. Area was irrigated with saline and afterwards there was no further purulence. The wound was packed with iodoform packing followed by a dry sterile dressing. Keep dressing clean, dry, intact thigh see him back on Wednesday for dressing change. -Continue antibiotics. Awaiting wound culture. -Elevation  -Pain medication as needed- Has not been taking this.  -Monitor for any clinical signs or symptoms of infection and DVT/PE and directed to call the office immediately should any occur or go to the ER. -Follow-up on Wednesday for dressing change or sooner if any problems arise. In the meantime, encouraged to call the office with  any questions, concerns, change in symptoms.   Celesta Gentile, DPM

## 2017-08-21 NOTE — Progress Notes (Signed)
Subjective: Edward Wolf is a 71 y.o. is seen today in office s/p left foot I&D preformed on 08/14/2017. He presents today for dressing change. He has almost completed his antibiotics. He's remain in the surgical boot. He has no new concerns today.  Denies any systemic complaints such as fevers, chills, nausea, vomiting. No calf pain, chest pain, shortness of breath.   Objective: General: No acute distress, AAOx3  DP/PT pulses palpable 2/4, CRT < 3 sec to all digits.  Protective sensation intact. Motor function intact.  Left foot: Incision is well coapted without any evidence of dehiscence with packing along the central aspect. Upon removal of the packing there was still small amount of purulence identified. This less compared to what it has been. There is no surroundingerythema, ascending cellulitis. There is no crepitus. There is no malodor. No other areas of tenderness to bilateral lower extremities.  No other open lesions or pre-ulcerative lesions.  No pain with calf compression, swelling, warmth, erythema.   Assessment and Plan:  Status post left foot I&D, doing well with no complications   -Treatment options discussed including all alternatives, risks, and complications -Packing was removed today and there is purulence expressed. Area was irrigated with saline and afterwards there was no further purulence. The areas packed with iodoform packing. This has been an ongoing issue for several months (since the spring) and there is no clinical signs of infection otherwise as far as erythema. He also states that since having the surgery the swelling to his left foot is improved. -Ice/elevation -Pain medication as needed. -Area is improving. Will hold off on MRI today.  -Monitor for any clinical signs or symptoms of infection and DVT/PE and directed to call the office immediately should any occur or go to the ER. -Follow-up on Friday for dressing change or sooner if any problems arise. In the  meantime, encouraged to call the office with any questions, concerns, change in symptoms.   *will try to get home health set up for him. Discussed with [place].   Celesta Gentile, DPM

## 2017-08-23 ENCOUNTER — Ambulatory Visit (INDEPENDENT_AMBULATORY_CARE_PROVIDER_SITE_OTHER): Payer: Self-pay | Admitting: Podiatry

## 2017-08-23 ENCOUNTER — Encounter: Payer: Self-pay | Admitting: Podiatry

## 2017-08-23 DIAGNOSIS — L0291 Cutaneous abscess, unspecified: Secondary | ICD-10-CM

## 2017-08-23 NOTE — Patient Instructions (Signed)
If the home health nurse does not come out on Monday come in to see me for a dressing change.

## 2017-08-26 NOTE — Progress Notes (Signed)
Subjective: Edward Wolf is a 71 y.o. is seen today in office s/p left foot I&D preformed on 08/14/2017. He presents today for dressing change. He states he is doing well he states he is feeling better. Has no new concerns today. He is rating the surgical boot. Denies any systemic complaints such as fevers, chills, nausea, vomiting. No calf pain, chest pain, shortness of breath.   Objective: General: No acute distress, AAOx3  DP/PT pulses palpable 2/4, CRT < 3 sec to all digits.  Protective sensation intact. Motor function intact.  Left foot: Incision is well coapted without any evidence of dehiscence with packing along the central aspect. There is yellow discoloration on the bandage. However upon removal to packing today also unable to express any purulence or drainage coming from the area except for small amount of blood. There is significant improved swelling to the area and there is no erythema or increase in warmth. There is no fluctuance or crepitus identified today. There is no tenderness palpation of the area. The wound does still probe approximate 2 cm in a medial direction. There is no probing of bone. No other open lesions or pre-ulcerative lesions.  No pain with calf compression, swelling, warmth, erythema.   Assessment and Plan:  Status post left foot I&D, improved  -Treatment options discussed including all alternatives, risks, and complications -The dressing was changed today. The area was cleaned. There is no further purulence identified after the packing was removed. I do want to continue 3 times a week dressing changes. Home health is scheduled to start on Monday. Discussed with him that at home health is not can change the bandage on Monday that he needs to comment for me to change this. Overall he is doing well and there was significantly improved drainage to the area today. We will continue with packing dressing changes. At this point we'll hold off on any further antibiotics. In the  hold off on MRI as well as he is doing better. -Monitor for any clinical signs or symptoms of infection and directed to call the office immediately should any occur or go to the ER. -RTC 1 week or sooner if needed.  Celesta Gentile, DPM

## 2017-08-27 ENCOUNTER — Ambulatory Visit: Payer: Federal, State, Local not specified - PPO

## 2017-08-27 DIAGNOSIS — R609 Edema, unspecified: Secondary | ICD-10-CM

## 2017-08-27 DIAGNOSIS — L0291 Cutaneous abscess, unspecified: Secondary | ICD-10-CM

## 2017-08-28 NOTE — Progress Notes (Signed)
Edward Wolf is a 71 y.o. is seen today in office s/p left foot I&D preformed on 08/14/2017. He presents today for dressing change.   Soiled dressing and packing removed from wound. Noted well healing surgical site free from erythema, and swelling and all other s/s of infection. Patient is afebrile and denies fever chills or nausea.   Wound packed with iodaform and sterile gauze applied. Advised patient that if he did not hear from [facility] then he is to come to the office to see me for a dressing change.

## 2017-09-02 ENCOUNTER — Ambulatory Visit (INDEPENDENT_AMBULATORY_CARE_PROVIDER_SITE_OTHER): Payer: Federal, State, Local not specified - PPO | Admitting: Podiatry

## 2017-09-02 DIAGNOSIS — L0291 Cutaneous abscess, unspecified: Secondary | ICD-10-CM

## 2017-09-04 NOTE — Progress Notes (Signed)
Subjective: Edward Wolf is a 71 y.o. is seen today in office s/p left foot I&D preformed on 08/14/2017. He states that the nurse to come out of [place] from [place] changing the dressing. He presents today for follow-up and dressing changes well. He states his been doing well. He states that the swelling has improved he continues to improve. He has no other concerns today. Denies any systemic complaints such as fevers, chills, nausea, vomiting. No calf pain, chest pain, shortness of breath.   Objective: General: No acute distress, AAOx3  DP/PT pulses palpable 2/4, CRT < 3 sec to all digits.  Protective sensation intact. Motor function intact.  Left foot: Incision is well coapted without any evidence of dehiscence with packing along the central aspect. A small amount of purulence is expressed today but there is no fractures or crepitus. A swelling along the area appears to be much improved compared to what it was prior to the surgery. There is no malodor. There is no overlying erythema, ascending cellulitis. There is no fluctuance or crepitus. There is no malodor. There is probing laterally about 1 cm. There is no probing to bone, tunneling. There is no other open lesions or pre-ulcer lesions identified. No other open lesions or pre-ulcerative lesions.  No pain with calf compression, swelling, warmth, erythema.   Assessment and Plan:  Status post left foot I&D, improved  -Treatment options discussed including all alternatives, risks, and complications -The dressing was changed today. I was able to remove the sutures today for any complications. Overall the wound is doing better and is only probing about 1 severe which is smaller than what was last appointment. -Area was irrigated packing was applied. Recommended him to have the nursing change the dressing Monday, Wednesday, Fridays. -Continue a surgical shoe -Will call to get pathology results  -Monitor for any clinical signs or symptoms of  infection and directed to call the office immediately should any occur or go to the ER. -Follow-up as scheduled or sooner if needed.  Celesta Gentile, DPM

## 2017-09-05 ENCOUNTER — Telehealth: Payer: Self-pay | Admitting: *Deleted

## 2017-09-05 NOTE — Telephone Encounter (Signed)
Called the surgical center this am and was told that Judson Roch does not come in until 8:30 am and I am going to call back to get the pathology report. Lattie Haw

## 2017-09-09 ENCOUNTER — Encounter: Payer: Self-pay | Admitting: Podiatry

## 2017-09-09 ENCOUNTER — Ambulatory Visit (INDEPENDENT_AMBULATORY_CARE_PROVIDER_SITE_OTHER): Payer: Federal, State, Local not specified - PPO | Admitting: Podiatry

## 2017-09-09 VITALS — BP 134/86 | HR 103 | Temp 98.3°F

## 2017-09-09 DIAGNOSIS — I83025 Varicose veins of left lower extremity with ulcer other part of foot: Secondary | ICD-10-CM

## 2017-09-09 DIAGNOSIS — L97529 Non-pressure chronic ulcer of other part of left foot with unspecified severity: Secondary | ICD-10-CM

## 2017-09-09 DIAGNOSIS — L97522 Non-pressure chronic ulcer of other part of left foot with fat layer exposed: Secondary | ICD-10-CM | POA: Diagnosis not present

## 2017-09-10 NOTE — Progress Notes (Signed)
   Subjective:  Patient presents today for follow up evaluation of an ulceration to the sub 1st MPJ of the left foot that was incised and drained by Dr. Jacqualyn Posey on 08/14/17. He states he is doing well at this time. He denies any drainage. There are no modifying factors noted. Patient presents today for further treatment and evaluation of the ulceration site.    Past Medical History:  Diagnosis Date  . Arthritis   . Asthma   . Colon polyps   . Hay fever   . Hyperlipidemia   . Hypertension   . Positive TB test      Objective/Physical Exam General: The patient is alert and oriented x3 in no acute distress.  Dermatology:  Wound #1 noted to the sub 1st MPJ of the left foot measuring 0.6 x 0.3 x 0.5 cm (LxWxD).   To the noted ulceration(s), there is no eschar. There is a moderate amount of slough, fibrin, and necrotic tissue noted. Granulation tissue and wound base is red. There is a minimal amount of serosanguineous drainage noted. There is no exposed bone muscle-tendon ligament or joint. There is no malodor. Periwound integrity is intact. Skin is warm, dry and supple bilateral lower extremities.  Vascular: Palpable pedal pulses bilaterally. Mild edema noted. Capillary refill within normal limits. Varicosities noted bilateral lower extremities.   Neurological: Epicritic and protective threshold absent bilaterally.   Musculoskeletal Exam: Range of motion within normal limits to all pedal and ankle joints bilateral. Muscle strength 5/5 in all groups bilateral.   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 #2 medically necessary excisional debridement including subcutaneous tissue was performed using a tissue nipper and a chisel blade. Excisional debridement of all the necrotic nonviable tissue down to healthy bleeding viable tissue was performed with post-debridement  measurements same as pre-. #3 the wound was cleansed with normal saline. #4 collagen dressing applied directly to wound followed by dry sterile dressings. #5 continue home health dressing changes. #6 Return to clinic in 1 week with Dr. Jacqualyn Posey.   Edrick Kins, DPM [facility]  Dr. Edrick Kins, [place]                                        [place],  [address]                Office [phone]  Fax [phone]

## 2017-09-16 ENCOUNTER — Ambulatory Visit (INDEPENDENT_AMBULATORY_CARE_PROVIDER_SITE_OTHER): Payer: Federal, State, Local not specified - PPO | Admitting: Podiatry

## 2017-09-16 DIAGNOSIS — L97521 Non-pressure chronic ulcer of other part of left foot limited to breakdown of skin: Secondary | ICD-10-CM

## 2017-09-19 NOTE — Progress Notes (Signed)
Subjective: Mr. Donoghue presents the office today for up evaluation of wound to left foot submetatarsal one. He states he is doing "excellent". He denies any swelling, redness, drainage or any pus or any red streaks. He denies any pain. He has been changing the wound dressing daily. He has no new concerns. Denies any systemic complaints such as fevers, chills, nausea, vomiting. No acute changes since last appointment, and no other complaints at this time.   Objective: AAO x3, NAD DP/PT pulses palpable bilaterally, CRT less than 3 seconds Left foot submetatarsal one hyperkeratotic lesion with central ulceration measuring 0.3 x 0.3 x 0.4 cm today. There is no probing, undermining or tunneling. There is no surrounding erythema, ascending cellulitis. His flexors or crepitus. There is no malodor. There is minimal edema to this area and overall appears much improved. No open lesions or pre-ulcerative lesions.  No pain with calf compression, swelling, warmth, erythema  Assessment: Wound left submetatarsal 1  Plan: -All treatment options discussed with the patient including all alternatives, risks, complications.  -Wound sharply debrided to the any complications to help accommodate tissue. Monitor continued antibiotic when a dressing changes daily. Continue offloading. Continue in surgical shoe. -No signs of clinical infection therefore will hold off any further antibiotics at this time -Monitor for any clinical signs or symptoms of infection and directed to call the office immediately should any occur or go to the ER. -RTC as scheduled or sooner if needed.  -Patient encouraged to call the office with any questions, concerns, change in symptoms.   Celesta Gentile, DPM

## 2017-09-26 ENCOUNTER — Ambulatory Visit (INDEPENDENT_AMBULATORY_CARE_PROVIDER_SITE_OTHER): Payer: Federal, State, Local not specified - PPO | Admitting: Podiatry

## 2017-09-26 ENCOUNTER — Encounter: Payer: Self-pay | Admitting: Podiatry

## 2017-09-26 DIAGNOSIS — L97521 Non-pressure chronic ulcer of other part of left foot limited to breakdown of skin: Secondary | ICD-10-CM

## 2017-09-29 NOTE — Progress Notes (Signed)
Subjective: Mr. Hudock presents the office today for up evaluation of wound to left foot submetatarsal one.  He states that he is "doing great". He is and wearing a regular shoe without any problems. He states the swelling is much improved as well he denies any drainage or any opening that he has noticed. He has not been put in a bandage on the area. He has no concerns. Denies any systemic complaints such as fevers, chills, nausea, vomiting. No acute changes since last appointment, and no other complaints at this time.   Objective: AAO x3, NAD DP/PT pulses palpable bilaterally, CRT less than 3 seconds Left foot submetatarsal one hyperkeratotic lesion which appears to have a healed wound however during debridement there was still a small  central ulceration measuring 0.2 x 0.2 x 0.4 cm today. There is no probing, undermining or tunneling. There is no surrounding erythema, ascending cellulitis. His fluctuance or crepitus. There is no malodor. There is minimal and improved edema to this area and overall appears much improved. Bilateral chronic swelling No open lesions or pre-ulcerative lesions.  No pain with calf compression, swelling, warmth, erythema  Assessment: Wound left submetatarsal 1, improving ; edema  Plan: -All treatment options discussed with the patient including all alternatives, risks, complications.  -Hyperkeratotic tissue is sharply debrided to go small central wound still present. Overall his swelling is much improved and there is no drainage or pus expressed and there is no click of signs of infection. I wanted to continue this moment into buttock ointment dressing changes daily. Continue with offloading. Monitor for any clinical signs or symptoms of infection and directed to call the office immediately should any occur or go to the ER. -Prescription for above-knee compression socks provided. -RTC 2 weeks or sooner if needed.   Celesta Gentile, DPM

## 2017-10-10 ENCOUNTER — Encounter: Payer: Self-pay | Admitting: Podiatry

## 2017-10-10 ENCOUNTER — Ambulatory Visit (INDEPENDENT_AMBULATORY_CARE_PROVIDER_SITE_OTHER): Payer: Federal, State, Local not specified - PPO | Admitting: Podiatry

## 2017-10-10 DIAGNOSIS — D369 Benign neoplasm, unspecified site: Secondary | ICD-10-CM | POA: Diagnosis not present

## 2017-10-10 DIAGNOSIS — L84 Corns and callosities: Secondary | ICD-10-CM

## 2017-10-10 NOTE — Progress Notes (Signed)
Subjective: Edward Wolf presents the office today for up evaluation of wound to left foot submetatarsal one. He states that is doing "very good". He denies any pain, swelling or drainage along the area of the surgery. Overall his feet continues well on both sides. He did get the above-the-knee compression socks which helps some more than his regular compression socks and he states is a pain again on. He denies any pain to his feet at this time he denies any other concerns. Denies any systemic complaints such as fevers, chills, nausea, vomiting. No acute changes since last appointment, and no other complaints at this time.   Objective: AAO x3, NAD DP/PT pulses palpable bilaterally, CRT less than 3 seconds Left foot submetatarsal one hyperkeratotic lesion is present. Upon debrided there is no underlying ulceration, drainage or any clinical signs of infection noted today. There does appear to be a area of the cystic, fluid-filled mass on the area which is starting to come back but there is no pain to the area is much smaller than what it was prior. There is no crepitation or surrounding erythema, ascending cellulitis. No clinical signs of infection present. No other open lesions or pre-ulcerative lesions identified bilaterally. Chronic swelling bilateral lower extremities. No pain with calf compression, warmth, erythema. No signs of DVT present.   Assessment: Wound left submetatarsal 1, improving ; edema  Plan: -All treatment options discussed with the patient including all alternatives, risks, complications.  -Hyperkeratotic tissue is sharply sharply debrided today without any complications or bleeding. Underlying wound appears to be healed. Discussed with him possible aspiration of the fluid. I'm a contact pathology regards to the read to have them take a second look at the specimen. -Continue compression -Monitor for any clinical signs or symptoms of infection and directed to call the office  immediately should any occur or go to the ER. -RTC 2 weeks or sooner if needed.  Celesta Gentile, DPM  --------  Addendum: I called and spoke with Dr. Mark Martinique, MD at 4:30 on 10/11/2017 to discuss the results. 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  Celesta Gentile, DPM

## 2017-10-13 DIAGNOSIS — D369 Benign neoplasm, unspecified site: Secondary | ICD-10-CM | POA: Insufficient documentation

## 2017-10-24 ENCOUNTER — Ambulatory Visit (INDEPENDENT_AMBULATORY_CARE_PROVIDER_SITE_OTHER): Payer: Federal, State, Local not specified - PPO | Admitting: Podiatry

## 2017-10-24 ENCOUNTER — Encounter: Payer: Self-pay | Admitting: Podiatry

## 2017-10-24 DIAGNOSIS — R609 Edema, unspecified: Secondary | ICD-10-CM

## 2017-10-24 DIAGNOSIS — L84 Corns and callosities: Secondary | ICD-10-CM | POA: Diagnosis not present

## 2017-10-24 DIAGNOSIS — D369 Benign neoplasm, unspecified site: Secondary | ICD-10-CM

## 2017-10-27 NOTE — Progress Notes (Signed)
Subjective: Mr. Gentzler presents the office today for up evaluation of wound to left foot submetatarsal one in for evaluation of the cyst.  He states the cyst is smaller today than what it was at last appointment he has no pain.  His major concern now is he still getting swelling to both of his legs.  He denies any cramps in his legs and no sharp pains.  This is been ongoing for some time has been ongoing prior to the surgery.  He has no other concerns today. Denies any systemic complaints such as fevers, chills, nausea, vomiting. No acute changes since last appointment, and no other complaints at this time.   Objective: AAO x3, NAD DP/PT pulses palpable bilaterally, CRT less than 3 seconds Left foot submetatarsal one hyperkeratotic lesion is present although minimal today.  There is a small amount of fluid present left foot submetatarsal one consistent with a well-defined cyst however this actually appears to be improved compared to what it was last appointment.  There is no pain to the area and there is no fluctuation or crepitation.  There is no overlying erythema, ascending cellulitis.  There are no clinical signs of infection present. Chronic swelling is present bilaterally and there is no skin breakdown.  There is swelling to the feet, ankles, legs.  There is no pain with calf compression, erythema or warmth.  There is no clinical signs of DVT present. No other open lesions or pre-ulcerative lesions are identified bilaterally  Assessment: Cyst left submetatarsal 1 without any pain; edema  Plan: -All treatment options discussed with the patient including all alternatives, risks, complications.  -Hyperkeratotic tissue is sharply sharply debrided today without any complications or bleeding.  There is no wound present to the area today.  Also the cyst appears to be much improved.  Because of this would hold off on draining it today as there is not much fluid able to be drained. -I previously done a  venous reflux study which was also negative.  Discussed other etiologies of swelling to his legs but I do not think this is coming from the cyst I have also confirmed with the pathology states it was a benign lesion and no evidence of malignancy.  Refer back to primary care physician.  Celesta Gentile, DPM

## 2018-01-30 ENCOUNTER — Ambulatory Visit: Payer: Federal, State, Local not specified - PPO | Admitting: Podiatry

## 2019-05-08 ENCOUNTER — Encounter: Payer: Self-pay | Admitting: Podiatry

## 2019-05-08 ENCOUNTER — Other Ambulatory Visit: Payer: Self-pay

## 2019-05-08 ENCOUNTER — Ambulatory Visit: Payer: Federal, State, Local not specified - PPO | Admitting: Podiatry

## 2019-05-08 VITALS — Temp 97.5°F

## 2019-05-08 DIAGNOSIS — Q828 Other specified congenital malformations of skin: Secondary | ICD-10-CM

## 2019-05-08 DIAGNOSIS — M79672 Pain in left foot: Secondary | ICD-10-CM

## 2019-05-09 NOTE — Progress Notes (Signed)
Subjective: 73 year old male presents the office with concerns of a painful spot on the ball of his left foot.  He states the area is very painful with pressure in shoes.  Is been keeping some moisturizer on the area daily.  He is not sure if it this is a wart or callus.  The area that we had previously treated on the left foot is been doing well and has had no other issues.  He states he still gets some occasional gout issues. Denies any systemic complaints such as fevers, chills, nausea, vomiting. No acute changes since last appointment, and no other complaints at this time.   Objective: AAO x3, NAD DP/PT pulses palpable 2/4 bilaterally, CRT less than 3 seconds Mild chronic bilateral lower extremity edema no erythema or warmth. On the left foot submetatarsal 3 is a thick hyperkeratotic lesion.  Upon debridement there is no ongoing ulceration, drainage or any clinical signs of infection noted today.  Mild callus to the first MPJ but no pain to this area.  No other areas of tenderness. No open lesions or pre-ulcerative lesions.  No pain with calf compression, swelling, warmth, erythema  Assessment: Porokeratosis left foot  Plan: -All treatment options discussed with the patient including all alternatives, risks, complications.  -Sharp debrided hyperkeratotic lesion left foot without any complications or bleeding.  He has adequate pulses no signs of infection.  I cleaned the area with alcohol.  Tablets placed followed by salicylic acid and a bandage.  Post procedure instructions were discussed.  Monitor for any signs or symptoms of infection. -Patient encouraged to call the office with any questions, concerns, change in symptoms.   Trula Slade DPM

## 2020-07-12 ENCOUNTER — Other Ambulatory Visit: Payer: Self-pay

## 2020-07-12 ENCOUNTER — Ambulatory Visit: Payer: Federal, State, Local not specified - PPO | Admitting: Podiatry

## 2020-07-12 DIAGNOSIS — M79672 Pain in left foot: Secondary | ICD-10-CM

## 2020-07-12 DIAGNOSIS — L989 Disorder of the skin and subcutaneous tissue, unspecified: Secondary | ICD-10-CM | POA: Diagnosis not present

## 2020-07-12 DIAGNOSIS — Q828 Other specified congenital malformations of skin: Secondary | ICD-10-CM | POA: Diagnosis not present

## 2020-07-12 MED ORDER — NONFORMULARY OR COMPOUNDED ITEM: 3 refills | Status: DC

## 2020-07-19 NOTE — Progress Notes (Signed)
Subjective: 74 year old male presents the office with concerns of a painful spot on the ball of his left foot.  He states the callus becomes very thick and causes discomfort.  He denies any drainage or pus or any swelling.  He said no recent treatment since last saw him. Denies any systemic complaints such as fevers, chills, nausea, vomiting. No acute changes since last appointment, and no other complaints at this time.   Objective: AAO x3, NAD DP/PT pulses palpable 2/4 bilaterally, CRT less than 3 seconds Significantly thickened hyperkeratotic lesion submetatarsal 1 as well as submetatarsal 3 of the left foot.  Debridement of the submetatarsal 1 lesion small mount of bleeding occurred but there is no underlying ulceration identified there is no drainage or pus.  There is no surrounding erythema, ascending cellulitis.  No fluctuation or crepitation peer there is no malodor. No open lesions or pre-ulcerative lesions.  No pain with calf compression, swelling, warmth, erythema  Assessment: Thick hyperkeratotic lesions left foot  Plan: -All treatment options discussed with the patient including all alternatives, risks, complications. -Sharply debrided lesions with any complications.  Mild bleeding submetatarsal 1 area skin with alcohol and antibiotic ointment was applied followed in a bandage.  Watch for signs or signs of infection.  Given the significant thickening I ordered a compound cream today for calluses to include salicylic acid as well as urea through [facility].  He can start using this once there is no open sores and we discussed to monitor for any signs of infection or any other issues.  No follow-ups on file.  Trula Slade DPM

## 2021-01-10 ENCOUNTER — Ambulatory Visit: Payer: Federal, State, Local not specified - PPO | Admitting: Podiatry

## 2021-01-23 ENCOUNTER — Other Ambulatory Visit: Payer: Self-pay

## 2021-01-23 ENCOUNTER — Ambulatory Visit: Payer: Federal, State, Local not specified - PPO | Admitting: Podiatry

## 2021-01-23 DIAGNOSIS — M79672 Pain in left foot: Secondary | ICD-10-CM

## 2021-01-23 DIAGNOSIS — Q828 Other specified congenital malformations of skin: Secondary | ICD-10-CM

## 2021-01-26 NOTE — Progress Notes (Signed)
Subjective: 75 year old male presents the office with concerns of a painful spot on the ball of his left foot.  He states the callus becomes very thick and causes discomfort.  The callus that he was having along submetatarsal 1 area is doing much better amatory discomfort is along the callus submetatarsal 3.  He has been using the compound ointment without any issues.  Denies any open sores and denies redness drainage or any signs of infection.   Objective: AAO x3, NAD DP/PT pulses palpable 2/4 bilaterally, CRT less than 3 seconds Significantly thickened hyperkeratotic lesion submetatarsal 1 as well as submetatarsal 3 of the left foot.  Debridement of the submetatarsal 1 lesion small mount of bleeding occurred but there is no underlying ulceration identified there is no drainage or pus.  There is no cyst identified for underlying soft tissue mass.  There is no surrounding erythema, ascending cellulitis.  No fluctuation or crepitation and there is no malodor. No open lesions or pre-ulcerative lesions.  No pain with calf compression, swelling, warmth, erythema  Assessment: Thick hyperkeratotic lesions left foot  Plan: -All treatment options discussed with the patient including all alternatives, risks, complications. -Sharply debrided lesions x2.  Mild bleeding submetatarsal 1 area skin with alcohol and antibiotic ointment was applied followed in a bandage.  Watch for signs or signs of infection.   -Continue compound cream and offloading.  Trula Slade DPM

## 2021-04-27 ENCOUNTER — Other Ambulatory Visit: Payer: Self-pay

## 2021-04-27 ENCOUNTER — Encounter (HOSPITAL_COMMUNITY): Payer: Self-pay

## 2021-04-27 ENCOUNTER — Emergency Department (HOSPITAL_COMMUNITY): Payer: Federal, State, Local not specified - PPO

## 2021-04-27 ENCOUNTER — Emergency Department (HOSPITAL_COMMUNITY)
Admission: EM | Admit: 2021-04-27 | Discharge: 2021-04-27 | Disposition: A | Discharge status: Home / Self Care | Payer: Federal, State, Local not specified - PPO | Attending: Emergency Medicine | Admitting: Emergency Medicine

## 2021-04-27 DIAGNOSIS — N320 Bladder-neck obstruction: Secondary | ICD-10-CM | POA: Diagnosis not present

## 2021-04-27 DIAGNOSIS — F17290 Nicotine dependence, other tobacco product, uncomplicated: Secondary | ICD-10-CM | POA: Insufficient documentation

## 2021-04-27 DIAGNOSIS — I10 Essential (primary) hypertension: Secondary | ICD-10-CM | POA: Insufficient documentation

## 2021-04-27 DIAGNOSIS — Z79899 Other long term (current) drug therapy: Secondary | ICD-10-CM | POA: Diagnosis not present

## 2021-04-27 DIAGNOSIS — R339 Retention of urine, unspecified: Secondary | ICD-10-CM | POA: Diagnosis present

## 2021-04-27 DIAGNOSIS — J45909 Unspecified asthma, uncomplicated: Secondary | ICD-10-CM | POA: Insufficient documentation

## 2021-04-27 LAB — BASIC METABOLIC PANEL
Anion gap: 15 (ref 5–15)
BUN: 15 mg/dL (ref 8–23)
CO2: 22 mmol/L (ref 22–32)
Calcium: 10.1 mg/dL (ref 8.9–10.3)
Chloride: 103 mmol/L (ref 98–111)
Creatinine, Ser: 1.3 mg/dL — ABNORMAL HIGH (ref 0.61–1.24)
GFR, Estimated: 58 mL/min — ABNORMAL LOW (ref >60)
Glucose, Bld: 176 mg/dL — ABNORMAL HIGH (ref 70–99)
Potassium: 3.1 mmol/L — ABNORMAL LOW (ref 3.5–5.1)
Sodium: 140 mmol/L (ref 135–145)

## 2021-04-27 LAB — URINALYSIS, ROUTINE W REFLEX MICROSCOPIC
Bacteria, UA: NONE SEEN
Bilirubin Urine: NEGATIVE
Glucose, UA: NEGATIVE mg/dL
Ketones, ur: NEGATIVE mg/dL
Leukocytes,Ua: NEGATIVE
Nitrite: NEGATIVE
Protein, ur: 100 mg/dL — AB
RBC / HPF: >50 RBC/hpf — ABNORMAL HIGH (ref 0–5)
Specific Gravity, Urine: 1.008 (ref 1.005–1.030)
pH: 7 (ref 5.0–8.0)

## 2021-04-27 LAB — CBC WITH DIFFERENTIAL/PLATELET
Abs Immature Granulocytes: 0.09 10*3/uL — ABNORMAL HIGH (ref 0.00–0.07)
Basophils Absolute: 0 10*3/uL (ref 0.0–0.1)
Basophils Relative: 0 %
Eosinophils Absolute: 0 10*3/uL (ref 0.0–0.5)
Eosinophils Relative: 0 %
HCT: 51.3 % (ref 39.0–52.0)
Hemoglobin: 17.7 g/dL — ABNORMAL HIGH (ref 13.0–17.0)
Immature Granulocytes: 1 %
Lymphocytes Relative: 4 %
Lymphs Abs: 0.7 10*3/uL (ref 0.7–4.0)
MCH: 31.7 pg (ref 26.0–34.0)
MCHC: 34.5 g/dL (ref 30.0–36.0)
MCV: 91.9 fL (ref 80.0–100.0)
Monocytes Absolute: 0.9 10*3/uL (ref 0.1–1.0)
Monocytes Relative: 5 %
Neutro Abs: 15.1 10*3/uL — ABNORMAL HIGH (ref 1.7–7.7)
Neutrophils Relative %: 90 %
Platelets: 296 10*3/uL (ref 150–400)
RBC: 5.58 MIL/uL (ref 4.22–5.81)
RDW: 13.5 % (ref 11.5–15.5)
WBC: 16.7 10*3/uL — ABNORMAL HIGH (ref 4.0–10.5)
nRBC: 0 % (ref 0.0–0.2)

## 2021-04-27 MED ORDER — CEPHALEXIN 500 MG PO CAPS: 500.0000 mg | ORAL_CAPSULE | Freq: Two times a day (BID) | ORAL | 0 refills | Status: AC

## 2021-04-27 MED ORDER — SODIUM CHLORIDE 0.9 % IV SOLN
1.0000 g | Freq: Once | INTRAVENOUS | Status: AC
  Administered 2021-04-27: 1 g via INTRAVENOUS
  Filled 2021-04-27: qty 10

## 2021-04-27 MED ORDER — LISINOPRIL 20 MG PO TABS
40.0000 mg | ORAL_TABLET | Freq: Once | ORAL | Status: AC
  Administered 2021-04-27: 40 mg via ORAL
  Filled 2021-04-27: qty 2

## 2021-04-27 MED ORDER — HYDROCHLOROTHIAZIDE 12.5 MG PO CAPS
12.5000 mg | ORAL_CAPSULE | Freq: Once | ORAL | Status: AC
  Administered 2021-04-27: 12.5 mg via ORAL
  Filled 2021-04-27: qty 1

## 2021-04-27 MED ORDER — AMLODIPINE BESYLATE 5 MG PO TABS: 5.0000 mg | ORAL_TABLET | Freq: Every day | ORAL | 1 refills | Status: DC

## 2021-04-27 MED ORDER — AMLODIPINE BESYLATE 10 MG PO TABS: 10.0000 mg | ORAL_TABLET | Freq: Every day | ORAL | 0 refills | Status: DC

## 2021-04-27 MED ORDER — AMLODIPINE BESYLATE 10 MG PO TABS: 5.0000 mg | ORAL_TABLET | Freq: Every day | ORAL | 1 refills | Status: DC

## 2021-04-27 MED ORDER — ONDANSETRON HCL 4 MG/2ML IJ SOLN
4.0000 mg | Freq: Once | INTRAMUSCULAR | Status: AC
  Administered 2021-04-27: 4 mg via INTRAVENOUS
  Filled 2021-04-27: qty 2

## 2021-04-27 MED ORDER — AMLODIPINE BESYLATE 5 MG PO TABS
10.0000 mg | ORAL_TABLET | Freq: Once | ORAL | Status: AC
  Administered 2021-04-27: 10 mg via ORAL
  Filled 2021-04-27: qty 2

## 2021-04-27 MED ORDER — CLONIDINE HCL 0.1 MG PO TABS
0.2000 mg | ORAL_TABLET | Freq: Once | ORAL | Status: AC
  Administered 2021-04-27: 0.2 mg via ORAL
  Filled 2021-04-27: qty 2

## 2021-04-27 MED ORDER — TAMSULOSIN HCL 0.4 MG PO CAPS: 0.4000 mg | ORAL_CAPSULE | Freq: Every day | ORAL | 0 refills | Status: AC

## 2021-04-27 NOTE — ED Notes (Signed)
Patient transported to CT 

## 2021-04-27 NOTE — ED Triage Notes (Signed)
Patient states he has not urinated since last night. Patient also c/o pelvic pain.

## 2021-04-27 NOTE — ED Provider Notes (Signed)
[facility] DEPT Provider Note   CSN: [id number] Arrival date & time: 04/27/21  1031     History Chief Complaint  Patient presents with  . Urinary Retention  . Pelvic Pain    Edward Wolf is a 75 y.o. male with history of hypertension, hyperlipidemia, enlarged prostate, presenting today with urinary retention.  Patient reports has not been able to urinate since last night.  He was morning significant lower abdominal and pelvic pain this morning.  He has not taken any of his medication including his blood pressure medicine.  He denies that he is ever had an issue with urinary difficulties like this.  He says normally is able to urinate fine.  His pain is 10 out of 10.  HPI     Past Medical History:  Diagnosis Date  . Arthritis   . Asthma   . Colon polyps   . Hay fever   . Hyperlipidemia   . Hypertension   . Positive TB test     Patient Active Problem List   Diagnosis Date Noted  . Dermoid cyst 10/13/2017  . Lower urinary tract symptoms (LUTS) 12/29/2013  . Gout 12/29/2013  . Hypokalemia 12/29/2013  . Erectile dysfunction 12/29/2013  . Hyperlipidemia   . Hypertension     Past Surgical History:  Procedure Laterality Date  . COLONOSCOPY    . HERNIA REPAIR  1998  . TOOTH EXTRACTION    . WRIST SURGERY  1964       Family History  Problem Relation Age of Onset  . Arthritis Mother   . Bladder Cancer Mother   . Hyperlipidemia Mother   . Heart disease Mother   . Hypertension Mother   . Arthritis Father   . Cancer Father   . Hyperlipidemia Father   . Heart disease Father   . Hypertension Father   . Hyperlipidemia Sister   . Hypertension Sister   . Heart disease Sister   . Arthritis Maternal Grandmother   . Arthritis Maternal Grandfather   . Arthritis Paternal Grandmother   . Arthritis Paternal Grandfather   . Prostate cancer Neg Hx     Social History   Tobacco Use  . Smoking status: Current Every Day Smoker    Types:  Cigars  . Smokeless tobacco: Never Used  Vaping Use  . Vaping Use: Never used  Substance Use Topics  . Alcohol use: Yes    Comment: occasional  . Drug use: No    Home Medications Prior to Admission medications   Medication Sig Start Date End Date Taking? Authorizing Provider  allopurinol (ZYLOPRIM) 300 MG tablet TAKE 1 TABLET (300 MG TOTAL) BY MOUTH DAILY. 11/08/15  Yes Lucille Passy, MD  amLODipine (NORVASC) 5 MG tablet Take 1 tablet (5 mg total) by mouth daily. 04/27/21 05/27/21 Yes Orville Widmann, Carola Rhine, MD  cephALEXin (KEFLEX) 500 MG capsule Take 1 capsule (500 mg total) by mouth 2 (two) times daily for 7 days. 04/28/21 05/05/21 Yes Anterio Scheel, Carola Rhine, MD  diclofenac Sodium (VOLTAREN) 1 % GEL Apply 2 g topically 4 (four) times daily as needed (pain).   Yes [provider]  minocycline (MINOCIN,DYNACIN) 100 MG capsule TAKE 1 CAPSULE BY MOUTH TWICE DAILY Patient taking differently: Take 100 mg by mouth daily as needed (arthritis). 01/11/15  Yes Lucille Passy, MD  Multiple Vitamins-Minerals (MULTIVITAMIN ADULT) CHEW Chew 3 tablets by mouth daily.   Yes [provider]  potassium chloride 20 MEQ/15ML (10%) SOLN Take 20 mEq by  mouth daily. 04/06/21  Yes [provider]  rosuvastatin (CRESTOR) 10 MG tablet Take 10 mg by mouth daily. 03/25/21  Yes [provider]  tamsulosin (FLOMAX) 0.4 MG CAPS capsule Take 1 capsule (0.4 mg total) by mouth daily after breakfast. 04/27/21 05/27/21 Yes Mayola Mcbain, Carola Rhine, MD  valsartan-hydrochlorothiazide (DIOVAN-HCT) 320-25 MG tablet Take 1 tablet by mouth daily. 02/06/21  Yes [provider]  amLODipine (NORVASC) 10 MG tablet Take 0.5 tablets (5 mg total) by mouth daily. 04/27/21 06/26/21  Wyvonnia Dusky, MD  amoxicillin-clavulanate (AUGMENTIN) 875-125 MG tablet Take 1 tablet by mouth 2 (two) times daily. Patient not taking: Reported on 04/27/2021 07/22/17   Trula Slade, DPM  ketoconazole (NIZORAL) 2 % cream Apply 1  application topically daily. Patient not taking: Reported on 04/27/2021 12/29/13   Lucille Passy, MD  KLOR-CON M10 10 MEQ tablet TAKE 3 TABLETS (30 MEQ TOTAL) BY MOUTH DAILY. Patient not taking: Reported on 04/27/2021 06/06/15   Lucille Passy, MD  lisinopril-hydrochlorothiazide (PRINZIDE,ZESTORETIC) 20-12.5 MG tablet Take 2 tablets by mouth daily. Patient not taking: No sig reported 12/19/15   Lucille Passy, MD  NONFORMULARY OR COMPOUNDED ITEM Callus cream : Salicylic acid 81%, Urea 82% Order faxed to [facility] Patient not taking: Reported on 04/27/2021 07/12/20   Trula Slade, DPM  sildenafil (REVATIO) 20 MG tablet TAKE 1-2 TABLETS AS NEEDED ONE HOUR PRIOR TO INTERCOURSE Patient not taking: Reported on 04/27/2021 09/09/15   Lucille Passy, MD  simvastatin (ZOCOR) 40 MG tablet Take 1 tablet (40 mg total) by mouth at bedtime. Patient not taking: Reported on 04/27/2021 11/08/15   Lucille Passy, MD    Allergies    Patient has no known allergies.  Review of Systems   Review of Systems  Constitutional: Negative for chills and fever.  HENT: Negative for ear pain and sore throat.   Eyes: Negative for pain and visual disturbance.  Respiratory: Negative for cough and shortness of breath.   Cardiovascular: Negative for chest pain and palpitations.  Gastrointestinal: Positive for abdominal pain. Negative for vomiting.  Genitourinary: Positive for difficulty urinating, flank pain and hematuria.  Musculoskeletal: Positive for myalgias. Negative for arthralgias.  Skin: Negative for color change and rash.  Neurological: Negative for syncope and light-headedness.  All other systems reviewed and are negative.   Physical Exam Updated Vital Signs BP 136/88   Pulse 95   Temp 99.1 F (37.3 C) (Oral)   Resp 16   Ht 6\' 2"  (1.88 m)   Wt 97.5 kg   SpO2 98%   BMI 27.60 kg/m   Physical Exam Constitutional:      General: He is in acute distress.  HENT:     Head: Normocephalic and atraumatic.   Eyes:     Conjunctiva/sclera: Conjunctivae normal.     Pupils: Pupils are equal, round, and reactive to light.  Cardiovascular:     Rate and Rhythm: Normal rate and regular rhythm.  Pulmonary:     Effort: Pulmonary effort is normal. No respiratory distress.  Abdominal:     General: There is no distension.     Tenderness: There is abdominal tenderness in the suprapubic area.  Skin:    General: Skin is warm and dry.  Neurological:     General: No focal deficit present.     Mental Status: He is alert. Mental status is at baseline.  Psychiatric:        Mood and Affect: Mood normal.  Behavior: Behavior normal.     ED Results / Procedures / Treatments   Labs (all labs ordered are listed, but only abnormal results are displayed) Labs Reviewed  URINALYSIS, ROUTINE W REFLEX MICROSCOPIC - Abnormal; Notable for the following components:      Result Value   Color, Urine RED (*)    APPearance HAZY (*)    Hgb urine dipstick MODERATE (*)    Protein, ur 100 (*)    RBC / HPF >50 (*)    All other components within normal limits  BASIC METABOLIC PANEL - Abnormal; Notable for the following components:   Potassium 3.1 (*)    Glucose, Bld 176 (*)    Creatinine, Ser 1.30 (*)    GFR, Estimated 58 (*)    All other components within normal limits  CBC WITH DIFFERENTIAL/PLATELET - Abnormal; Notable for the following components:   WBC 16.7 (*)    Hemoglobin 17.7 (*)    Neutro Abs 15.1 (*)    Abs Immature Granulocytes 0.09 (*)    All other components within normal limits  URINE CULTURE    EKG None  Radiology CT Renal Stone Study  Result Date: 04/27/2021 CLINICAL DATA:  Lower abdominal pain and pelvic pain.  Hematuria. EXAM: CT ABDOMEN AND PELVIS WITHOUT CONTRAST TECHNIQUE: Multidetector CT imaging of the abdomen and pelvis was performed following the standard protocol without IV contrast. COMPARISON:  None. FINDINGS: Lower chest: Dependent subsegmental atelectasis in both lower  lobes. Hepatobiliary: Unremarkable Pancreas: Unremarkable Spleen: Unremarkable Adrenals/Urinary Tract: Low-density prominence of both adrenal glands. There is an unusual pattern of substantial bilateral perinephric stranding with a small amount of adjacent fluid extending below the transverse duodenum and pancreatic head. 7 cm cystic lesion of the right mid kidney anteriorly noted. Smaller 2.2 cm cystic lesion of the left kidney upper pole. Mild prominence of the bilateral renal pelvis along with mild proximal bilateral hydroureter. The urinary bladder is empty with a Foley catheter in place. There is fluid stranding around the urinary bladder and a very large prostate gland indenting the bladder base. Stomach/Bowel: Sigmoid colon diverticulosis without active diverticulitis. Vascular/Lymphatic: Aortoiliac atherosclerotic vascular disease. No pathologic adenopathy. Reproductive: Prostate gland measures 7.4 by 6.7 by 8.5 cm (volume = 220 cm^3). Other: No supplemental non-categorized findings. Musculoskeletal: Right indirect inguinal hernia contains adipose tissue. Intramuscular lipoma anterior to the left proximal femur. Bridging spurring of the sacroiliac joints. Lumbar spondylosis and degenerative disc disease contributing to multilevel lumbar impingement. IMPRESSION: 1. Prominent perirenal stranding and stranding around the somewhat collapsed urinary bladder with Foley catheter in place. The Foley catheter traverses a markedly enlarged prostate gland. Given the patient's relief with catheterization, I suspect that the patient had bladder outlet obstruction at least partially due to the marked prostatomegaly leading to the considerable perirenal and perivesicular stranding, relieved by placement of the Foley catheter. Strictly speaking other inflammatory causes for the patient's bilateral perirenal stranding are not excluded. 2. Large right mid kidney cyst and smaller left kidney upper pole cyst. 3.  Aortic  Atherosclerosis (ICD10-I70.0). 4. Indirect right inguinal hernia containing adipose tissue. Electronically Signed   By: Van Clines M.D.   On: 04/27/2021 14:52    Procedures Procedures   Medications Ordered in ED Medications  lisinopril (ZESTRIL) tablet 40 mg (40 mg Oral Given 04/27/21 1119)  hydrochlorothiazide (MICROZIDE) capsule 12.5 mg (12.5 mg Oral Given 04/27/21 1119)  cloNIDine (CATAPRES) tablet 0.2 mg (0.2 mg Oral Given 04/27/21 1119)  ondansetron (ZOFRAN) injection 4 mg (4 mg Intravenous  Given 04/27/21 1220)  cefTRIAXone (ROCEPHIN) 1 g in sodium chloride 0.9 % 100 mL IVPB (0 g Intravenous Stopped 04/27/21 1541)  amLODipine (NORVASC) tablet 10 mg (10 mg Oral Given 04/27/21 1532)    ED Course  I have reviewed the triage vital signs and the nursing notes.  Pertinent labs & imaging results that were available during my care of the patient were reviewed by me and considered in my medical decision making (see chart for details).  Patient presenting with abdominal pain and difficulty with urination, suprapubic discomfort.  Concern for acute urinary retention.  I have asked his nurse to place a Foley catheter to drain the bladder.  Patient was able to produce a very small amount of frankly bloody urine.  I will also order his morning blood pressure medications.  He is quite hypertensive on arrival, which is probably being driven by the pain.  Labs reviewed -WBC 16.7, which may be reactive from bladder obstruction, versus infectious. UA with neg leuks, nitrites BMP with baseline hypoK, mild, Cr elevation to 1.3, likely post-obstructive. CT renal study with perinephric and bladder stranding, enlarged prostate, right inguinal hernia (does not appear incarcerated on exam)   Clinical Course as of 04/27/21 1724  Thu Apr 27, 2021  1152 Catheter placed with 1600 cc blood tinged urine return [MT]  1532 Patient reassessed.  His pain is very significantly improved, reports he has no abdominal  pain now.  I discussed the work-up with the patient and his wife.  I have also spoken to Dr Junious Silk from urology.  We will maintain the Foley for now until he can follow-up with urology in office.  They want the Foley for at least 7 to 10 days.  I will also give the patient a dose of IV antibiotics with his perinephric stranding, which may simply be reactive to over distention, but he does report nausea and has leukocytosis with a left shift on CBC, therefore we will begin treatment with some Keflex after Rocephin in the ED.  Is able to answer all of his questions [MT]  1539 Patient reporting that he is only on valsartan for blood pressure, no clonidine.  Apparently this was an old medication.  His blood pressure appears to have improved relief of his pain. [MT]    Clinical Course User Index [MT] Langston Masker Carola Rhine, MD    Final Clinical Impression(s) / ED Diagnoses Final diagnoses:  Bladder outlet obstruction  Hypertension, unspecified type    Rx / DC Orders ED Discharge Orders         Ordered    cephALEXin (KEFLEX) 500 MG capsule  2 times daily        04/27/21 1535    tamsulosin (FLOMAX) 0.4 MG CAPS capsule  Daily after breakfast        04/27/21 1535    amLODipine (NORVASC) 10 MG tablet  Daily,   Status:  Discontinued        04/27/21 1535    amLODipine (NORVASC) 10 MG tablet  Daily        04/27/21 1540    amLODipine (NORVASC) 5 MG tablet  Daily        04/27/21 1548           Wyvonnia Dusky, MD 04/27/21 1724

## 2021-04-27 NOTE — Discharge Instructions (Addendum)
You have a very enlarged prostate.  This needs the attention of urologist.  Please call [facility] at the number above to ask for an appointment.  I spoke to Dr Junious Silk, one of their urologists, who said the office should be able to arrange follow up visit in 1-2 weeks.  In the meantime, the urologist have recommended that you keep this Foley in your bladder.  You will need to change the bag every time it gets full, likely twice a day. They may attempt to take the Foley out when you see them in the office.  We prescribed you a new blood pressure medicine called amlodipine.  This is because your blood pressure was extremely high today, even with your normal medications.  Please begin checking your blood pressure every day and keep a journal at home.  You should follow-up with your primary care doctor about how to manage your blood pressure at home.  Please keep drinking plenty of water at home.  I prescribed you a few more days of an antibiotic called Keflex to treat a possible urine infection (it is not clear that there is an infection) and prevent further infection.

## 2021-04-27 NOTE — ED Notes (Signed)
Family to bedside at this time, patient provided with warm blankets per request

## 2021-04-27 NOTE — ED Notes (Signed)
Foley changed to leg bag at this time, spouse taught how to open and close

## 2021-04-29 LAB — URINE CULTURE: Culture: NO GROWTH

## 2021-05-20 ENCOUNTER — Encounter (HOSPITAL_COMMUNITY): Payer: Self-pay | Admitting: Emergency Medicine

## 2021-05-20 ENCOUNTER — Emergency Department (HOSPITAL_COMMUNITY)
Admission: EM | Admit: 2021-05-20 | Discharge: 2021-05-20 | Disposition: A | Discharge status: Home / Self Care | Payer: Federal, State, Local not specified - PPO | Attending: Emergency Medicine | Admitting: Emergency Medicine

## 2021-05-20 ENCOUNTER — Other Ambulatory Visit: Payer: Self-pay

## 2021-05-20 DIAGNOSIS — R319 Hematuria, unspecified: Secondary | ICD-10-CM | POA: Diagnosis not present

## 2021-05-20 DIAGNOSIS — Z79899 Other long term (current) drug therapy: Secondary | ICD-10-CM | POA: Diagnosis not present

## 2021-05-20 DIAGNOSIS — R339 Retention of urine, unspecified: Secondary | ICD-10-CM | POA: Diagnosis not present

## 2021-05-20 DIAGNOSIS — F17290 Nicotine dependence, other tobacco product, uncomplicated: Secondary | ICD-10-CM | POA: Insufficient documentation

## 2021-05-20 DIAGNOSIS — J45909 Unspecified asthma, uncomplicated: Secondary | ICD-10-CM | POA: Insufficient documentation

## 2021-05-20 DIAGNOSIS — I10 Essential (primary) hypertension: Secondary | ICD-10-CM | POA: Insufficient documentation

## 2021-05-20 DIAGNOSIS — R1030 Lower abdominal pain, unspecified: Secondary | ICD-10-CM | POA: Diagnosis not present

## 2021-05-20 LAB — URINALYSIS, ROUTINE W REFLEX MICROSCOPIC
Bacteria, UA: NONE SEEN
Bilirubin Urine: NEGATIVE
Glucose, UA: NEGATIVE mg/dL
Ketones, ur: NEGATIVE mg/dL
Leukocytes,Ua: NEGATIVE
Nitrite: NEGATIVE
Protein, ur: NEGATIVE mg/dL
RBC / HPF: >50 RBC/hpf — ABNORMAL HIGH (ref 0–5)
Specific Gravity, Urine: 1.01 (ref 1.005–1.030)
pH: 6 (ref 5.0–8.0)

## 2021-05-20 MED ORDER — LIDOCAINE HCL (CARDIAC) PF 100 MG/5ML IV SOSY
PREFILLED_SYRINGE | INTRAVENOUS | Status: AC
  Filled 2021-05-20: qty 5

## 2021-05-20 MED ORDER — LIDOCAINE HCL URETHRAL/MUCOSAL 2 % EX GEL: 1.0000 "application " | Freq: Once | CUTANEOUS | Status: DC

## 2021-05-20 NOTE — ED Triage Notes (Signed)
Patient c/o urinary retention since having foley removed yesterday. Reports leaking urine but unable to empty bladder.

## 2021-05-20 NOTE — ED Provider Notes (Signed)
Emergency Medicine Provider Triage Evaluation Note  Edward Wolf , a 75 y.o. male  was evaluated in triage.  Pt complains of urinary retention and suprapubic pain.  Patient reports that he had a Foley catheter removed yesterday, patient states catheter was placed due to episode of urinary retention.  Patient had no difficulty urinating every 2 hours yesterday.  Patient last had normal urination yesterday evening.  Patient woke this morning and has been unable to urinate.  Patient reports that he is leaking urine.  Patient has developed tenderness to suprapubic area this morning.  Patient states this feels similar to his previous episode of urinary retention.  Patient denies any dysuria, hematuria.  Review of Systems  Positive: Suprapubic tenderness, enuresis Negative: Fever, chills, dysuria, hematuria, penile discharge, nausea, vomiting  Physical Exam  BP (!) 183/121 (BP Location: Left Arm)   Pulse (!) 126   Temp 98.3 F (36.8 C) (Oral)   Resp 20   SpO2 100%  Gen:   Awake, no distress   Resp:  Normal effort  MSK:   Moves extremities without difficulty  Other:  Tenderness and distention to suprapubic area,   Medical Decision Making  Medically screening exam initiated at 10:08 AM.  Appropriate orders placed.  Edward Wolf was informed that the remainder of the evaluation will be completed by another provider, this initial triage assessment does not replace that evaluation, and the importance of remaining in the ED until their evaluation is complete.  Will place bladder scan on this patient.  If patient has urinary retention on bladder scan informed RN to move into next available room for urinary catheterization.   Edward Beckwith, PA-C 05/20/21 1010    Edward Bo, MD 05/20/21 1735

## 2021-05-20 NOTE — ED Provider Notes (Signed)
[facility] DEPT Provider Note   CSN: [id number] Arrival date & time: 05/20/21  1884     History Chief Complaint  Patient presents with   Urinary Retention    Edward Wolf is a 75 y.o. male.  HPI He presents for evaluation of inability to void, which worsened around 8 PM last night.  Prior to that he had a Foley catheter removed during the day and had a successful voiding trial.  He had had a Foley catheter in for about 10 days, after an episode of bleeding.  He is taking tamsulosin and Proscar, for prostatic hypertrophy.  He denies fever, chills, cough, shortness of breath.  He has not had visualized blood in urine.  He presents complaining of low abdominal pain.  There are no other known active modifying factors.    Past Medical History:  Diagnosis Date   Arthritis    Asthma    Colon polyps    Hay fever    Hyperlipidemia    Hypertension    Positive TB test     Patient Active Problem List   Diagnosis Date Noted   Dermoid cyst 10/13/2017   Lower urinary tract symptoms (LUTS) 12/29/2013   Gout 12/29/2013   Hypokalemia 12/29/2013   Erectile dysfunction 12/29/2013   Hyperlipidemia    Hypertension     Past Surgical History:  Procedure Laterality Date   COLONOSCOPY     HERNIA REPAIR  1998   TOOTH EXTRACTION     WRIST SURGERY  1964       Family History  Problem Relation Age of Onset   Arthritis Mother    Bladder Cancer Mother    Hyperlipidemia Mother    Heart disease Mother    Hypertension Mother    Arthritis Father    Cancer Father    Hyperlipidemia Father    Heart disease Father    Hypertension Father    Hyperlipidemia Sister    Hypertension Sister    Heart disease Sister    Arthritis Maternal Grandmother    Arthritis Maternal Grandfather    Arthritis Paternal Grandmother    Arthritis Paternal Grandfather    Prostate cancer Neg Hx     Social History   Tobacco Use   Smoking status: Every Day    Pack years: 0.00     Types: Cigars   Smokeless tobacco: Never  Vaping Use   Vaping Use: Never used  Substance Use Topics   Alcohol use: Yes    Comment: occasional   Drug use: No    Home Medications Prior to Admission medications   Medication Sig Start Date End Date Taking? Authorizing Provider  allopurinol (ZYLOPRIM) 300 MG tablet TAKE 1 TABLET (300 MG TOTAL) BY MOUTH DAILY. 11/08/15   Lucille Passy, MD  amLODipine (NORVASC) 10 MG tablet Take 0.5 tablets (5 mg total) by mouth daily. 04/27/21 06/26/21  Wyvonnia Dusky, MD  amLODipine (NORVASC) 5 MG tablet Take 1 tablet (5 mg total) by mouth daily. 04/27/21 05/27/21  Wyvonnia Dusky, MD  amoxicillin-clavulanate (AUGMENTIN) 875-125 MG tablet Take 1 tablet by mouth 2 (two) times daily. Patient not taking: Reported on 04/27/2021 07/22/17   Trula Slade, DPM  diclofenac Sodium (VOLTAREN) 1 % GEL Apply 2 g topically 4 (four) times daily as needed (pain).    [provider]  finasteride (PROSCAR) 5 MG tablet Take 5 mg by mouth daily. 05/02/21   [provider]  ketoconazole (NIZORAL) 2 % cream Apply 1 application  topically daily. Patient not taking: Reported on 04/27/2021 12/29/13   Lucille Passy, MD  KLOR-CON M10 10 MEQ tablet TAKE 3 TABLETS (30 MEQ TOTAL) BY MOUTH DAILY. Patient not taking: Reported on 04/27/2021 06/06/15   Lucille Passy, MD  lisinopril-hydrochlorothiazide (PRINZIDE,ZESTORETIC) 20-12.5 MG tablet Take 2 tablets by mouth daily. Patient not taking: No sig reported 12/19/15   Lucille Passy, MD  minocycline (MINOCIN,DYNACIN) 100 MG capsule TAKE 1 CAPSULE BY MOUTH TWICE DAILY Patient taking differently: Take 100 mg by mouth daily as needed (arthritis). 01/11/15   Lucille Passy, MD  Multiple Vitamins-Minerals (MULTIVITAMIN ADULT) CHEW Chew 3 tablets by mouth daily.    [provider]  NONFORMULARY OR COMPOUNDED ITEM Callus cream : Salicylic acid 74%, Urea 16% Order faxed to [facility] Patient not taking: Reported on  04/27/2021 07/12/20   Trula Slade, DPM  potassium chloride 20 MEQ/15ML (10%) SOLN Take 20 mEq by mouth daily. 04/06/21   [provider]  rosuvastatin (CRESTOR) 10 MG tablet Take 10 mg by mouth daily. 03/25/21   [provider]  sildenafil (REVATIO) 20 MG tablet TAKE 1-2 TABLETS AS NEEDED ONE HOUR PRIOR TO INTERCOURSE Patient not taking: Reported on 04/27/2021 09/09/15   Lucille Passy, MD  simvastatin (ZOCOR) 40 MG tablet Take 1 tablet (40 mg total) by mouth at bedtime. Patient not taking: Reported on 04/27/2021 11/08/15   Lucille Passy, MD  tamsulosin [facility]) 0.4 MG CAPS capsule Take 1 capsule (0.4 mg total) by mouth daily after breakfast. 04/27/21 05/27/21  Wyvonnia Dusky, MD  valsartan-hydrochlorothiazide (DIOVAN-HCT) 320-25 MG tablet Take 1 tablet by mouth daily. 02/06/21   [provider]    Allergies    Patient has no known allergies.  Review of Systems   Review of Systems  All other systems reviewed and are negative.  Physical Exam Updated Vital Signs BP (!) 173/109   Pulse (!) 104   Temp 98.3 F (36.8 C) (Oral)   Resp 16   SpO2 97%   Physical Exam Vitals and nursing note reviewed.  Constitutional:      Appearance: He is well-developed. He is not ill-appearing.  HENT:     Head: Normocephalic and atraumatic.     Right Ear: External ear normal.     Left Ear: External ear normal.  Eyes:     Conjunctiva/sclera: Conjunctivae normal.     Pupils: Pupils are equal, round, and reactive to light.  Neck:     Trachea: Phonation normal.  Cardiovascular:     Rate and Rhythm: Normal rate.  Pulmonary:     Effort: Pulmonary effort is normal.  Abdominal:     General: There is no distension.     Palpations: Abdomen is soft.     Tenderness: There is no abdominal tenderness.  Genitourinary:    Comments: Foley catheter draining clear yellow urine.  2 L of urine in the bag. Musculoskeletal:        General: Normal range of motion.     Cervical back:  Normal range of motion and neck supple.  Skin:    General: Skin is warm and dry.  Neurological:     Mental Status: He is alert and oriented to person, place, and time.     Cranial Nerves: No cranial nerve deficit.     Sensory: No sensory deficit.     Motor: No abnormal muscle tone.     Coordination: Coordination normal.  Psychiatric:  Mood and Affect: Mood normal.        Behavior: Behavior normal.        Thought Content: Thought content normal.        Judgment: Judgment normal.    ED Results / Procedures / Treatments   Labs (all labs ordered are listed, but only abnormal results are displayed) Labs Reviewed  URINALYSIS, ROUTINE W REFLEX MICROSCOPIC - Abnormal; Notable for the following components:      Result Value   Hgb urine dipstick MODERATE (*)    RBC / HPF >50 (*)    All other components within normal limits  URINE CULTURE    EKG None  Radiology No results found.  Procedures Procedures   Medications Ordered in ED Medications  lidocaine (XYLOCAINE) 2 % jelly 1 application (1 application Urethral Not Given 05/20/21 1128)  lidocaine (cardiac) 100 mg/62mL (XYLOCAINE) 100 MG/5ML injection 2% (  Not Given 05/20/21 1115)    ED Course  I have reviewed the triage vital signs and the nursing notes.  Pertinent labs & imaging results that were available during my care of the patient were reviewed by me and considered in my medical decision making (see chart for details).    MDM Rules/Calculators/A&P                           Patient Vitals for the past 24 hrs:  BP Temp Temp src Pulse Resp SpO2  05/20/21 1200 (!) 173/109 -- -- (!) 104 16 97 %  05/20/21 1139 (!) 187/112 -- -- (!) 103 18 99 %  05/20/21 1136 -- -- -- (!) 107 -- 100 %  05/20/21 1135 (!) 187/112 -- -- -- -- --  05/20/21 0945 (!) 183/121 98.3 F (36.8 C) Oral (!) 126 20 100 %    1:16 PM Reevaluation with update and discussion. After initial assessment and treatment, an updated evaluation reveals he  remains comfortable has no further complaints. Daleen Bo   Medical Decision Making:  This patient is presenting for evaluation of difficulty urinating, which does require a range of treatment options, and is a complaint that involves a moderate risk of morbidity and mortality. The differential diagnoses include urinary bladder outlet obstruction, UTI, hematuria. I decided to review old records, and in summary elderly male, had his catheter removed yesterday now has trouble urinating.  I did not require additional historical information from anyone.  Clinical Laboratory Tests Ordered, included Urinalysis. Review indicates normal finding, except greater than 50 red cells per high-power field.   Critical Interventions-clinical evaluation, bladder scan, Foley catheter ordered, observation reassessment  After These Interventions, the Patient was reevaluated and was found improved after placement of catheter.  Good urinary output in the catheter bag.  Urinalysis indicates only blood, no signs of infection.  No indication for hospitalization or change in other management.  Discharge with Foley catheter in place to follow-up with urology, for management  CRITICAL CARE-no Performed by: Daleen Bo  Nursing Notes Reviewed/ Care Coordinated Applicable Imaging Reviewed Interpretation of Laboratory Data incorporated into ED treatment  The patient appears reasonably screened and/or stabilized for discharge and I doubt any other medical condition or other [facility] requiring further screening, evaluation, or treatment in the ED at this time prior to discharge.  Plan: Home Medications-continue usual; Home Treatments-rest, fluid; return here if the recommended treatment, does not improve the symptoms; Recommended follow up-urology follow-up 5 to 7 days     Final Clinical Impression(s) /  ED Diagnoses Final diagnoses:  Urinary retention  Hematuria, unspecified type    Rx / DC Orders ED Discharge  Orders     None        Daleen Bo, MD 05/20/21 1320

## 2021-05-20 NOTE — Discharge Instructions (Addendum)
You will have to have a catheter and until you see your urologist again.  There was just a small amount of blood in the urine today but no sign of infection.  Return here, if needed for problems.

## 2021-05-21 LAB — URINE CULTURE: Culture: NO GROWTH

## 2021-06-08 ENCOUNTER — Emergency Department (HOSPITAL_COMMUNITY)
Admission: EM | Admit: 2021-06-08 | Discharge: 2021-06-08 | Disposition: A | Discharge status: Home / Self Care | Payer: Federal, State, Local not specified - PPO | Attending: Emergency Medicine | Admitting: Emergency Medicine

## 2021-06-08 ENCOUNTER — Other Ambulatory Visit: Payer: Self-pay

## 2021-06-08 ENCOUNTER — Encounter (HOSPITAL_COMMUNITY): Payer: Self-pay

## 2021-06-08 DIAGNOSIS — J45909 Unspecified asthma, uncomplicated: Secondary | ICD-10-CM | POA: Diagnosis not present

## 2021-06-08 DIAGNOSIS — R339 Retention of urine, unspecified: Secondary | ICD-10-CM | POA: Diagnosis not present

## 2021-06-08 DIAGNOSIS — E876 Hypokalemia: Secondary | ICD-10-CM | POA: Diagnosis not present

## 2021-06-08 DIAGNOSIS — R1030 Lower abdominal pain, unspecified: Secondary | ICD-10-CM | POA: Insufficient documentation

## 2021-06-08 DIAGNOSIS — I10 Essential (primary) hypertension: Secondary | ICD-10-CM | POA: Insufficient documentation

## 2021-06-08 DIAGNOSIS — R110 Nausea: Secondary | ICD-10-CM

## 2021-06-08 DIAGNOSIS — F17210 Nicotine dependence, cigarettes, uncomplicated: Secondary | ICD-10-CM | POA: Diagnosis not present

## 2021-06-08 LAB — CBC WITH DIFFERENTIAL/PLATELET
Abs Immature Granulocytes: 0.06 10*3/uL (ref 0.00–0.07)
Basophils Absolute: 0 10*3/uL (ref 0.0–0.1)
Basophils Relative: 0 %
Eosinophils Absolute: 0 10*3/uL (ref 0.0–0.5)
Eosinophils Relative: 0 %
HCT: 45.6 % (ref 39.0–52.0)
Hemoglobin: 15.9 g/dL (ref 13.0–17.0)
Immature Granulocytes: 1 %
Lymphocytes Relative: 9 %
Lymphs Abs: 1.1 10*3/uL (ref 0.7–4.0)
MCH: 32.1 pg (ref 26.0–34.0)
MCHC: 34.9 g/dL (ref 30.0–36.0)
MCV: 91.9 fL (ref 80.0–100.0)
Monocytes Absolute: 0.7 10*3/uL (ref 0.1–1.0)
Monocytes Relative: 6 %
Neutro Abs: 11.1 10*3/uL — ABNORMAL HIGH (ref 1.7–7.7)
Neutrophils Relative %: 84 %
Platelets: 299 10*3/uL (ref 150–400)
RBC: 4.96 MIL/uL (ref 4.22–5.81)
RDW: 13.5 % (ref 11.5–15.5)
WBC: 13 10*3/uL — ABNORMAL HIGH (ref 4.0–10.5)
nRBC: 0 % (ref 0.0–0.2)

## 2021-06-08 LAB — COMPREHENSIVE METABOLIC PANEL
ALT: 20 U/L (ref 0–44)
AST: 25 U/L (ref 15–41)
Albumin: 5 g/dL (ref 3.5–5.0)
Alkaline Phosphatase: 78 U/L (ref 38–126)
Anion gap: 13 (ref 5–15)
BUN: 21 mg/dL (ref 8–23)
CO2: 21 mmol/L — ABNORMAL LOW (ref 22–32)
Calcium: 10.6 mg/dL — ABNORMAL HIGH (ref 8.9–10.3)
Chloride: 106 mmol/L (ref 98–111)
Creatinine, Ser: 1.21 mg/dL (ref 0.61–1.24)
GFR, Estimated: >60 mL/min (ref >60)
Glucose, Bld: 153 mg/dL — ABNORMAL HIGH (ref 70–99)
Potassium: 3 mmol/L — ABNORMAL LOW (ref 3.5–5.1)
Sodium: 140 mmol/L (ref 135–145)
Total Bilirubin: 1.3 mg/dL — ABNORMAL HIGH (ref 0.3–1.2)
Total Protein: 8.7 g/dL — ABNORMAL HIGH (ref 6.5–8.1)

## 2021-06-08 LAB — URINALYSIS, ROUTINE W REFLEX MICROSCOPIC
Bacteria, UA: NONE SEEN
Bilirubin Urine: NEGATIVE
Glucose, UA: NEGATIVE mg/dL
Ketones, ur: NEGATIVE mg/dL
Nitrite: NEGATIVE
Protein, ur: 30 mg/dL — AB
Specific Gravity, Urine: 1.011 (ref 1.005–1.030)
pH: 5 (ref 5.0–8.0)

## 2021-06-08 LAB — LIPASE, BLOOD: Lipase: 35 U/L (ref 11–51)

## 2021-06-08 MED ORDER — MORPHINE SULFATE (PF) 4 MG/ML IV SOLN
4.0000 mg | Freq: Once | INTRAVENOUS | Status: AC
  Administered 2021-06-08: 4 mg via INTRAVENOUS
  Filled 2021-06-08: qty 1

## 2021-06-08 MED ORDER — SODIUM CHLORIDE 0.9 % IV BOLUS
1000.0000 mL | Freq: Once | INTRAVENOUS | Status: AC
  Administered 2021-06-08: 1000 mL via INTRAVENOUS

## 2021-06-08 MED ORDER — ONDANSETRON HCL 4 MG/2ML IJ SOLN
4.0000 mg | Freq: Once | INTRAMUSCULAR | Status: AC
  Administered 2021-06-08: 4 mg via INTRAVENOUS
  Filled 2021-06-08: qty 2

## 2021-06-08 MED ORDER — POTASSIUM CHLORIDE CRYS ER 20 MEQ PO TBCR
40.0000 meq | EXTENDED_RELEASE_TABLET | Freq: Once | ORAL | Status: AC
  Administered 2021-06-08: 40 meq via ORAL
  Filled 2021-06-08: qty 2

## 2021-06-08 MED ORDER — ONDANSETRON 4 MG PO TBDP: 4.0000 mg | ORAL_TABLET | Freq: Three times a day (TID) | ORAL | 0 refills | Status: DC | PRN

## 2021-06-08 NOTE — ED Provider Notes (Signed)
[facility] DEPT Provider Note   CSN: [id number] Arrival date & time: 06/08/21  1287     History Chief Complaint  Patient presents with   Abdominal Pain    Edward Wolf is a 75 y.o. male.  Pt presents to the ED today with abdominal pain and dry heaves.  His birthday was yesterday and thinks he has overdone it.  He said he had a foley removed on Tuesday, 6/28 for urinary retention.  He thinks he is not emptying his bladder all the way again. He can dribble some urine out, but it is not much.  He c/o buring in his abdomen since last night.      Past Medical History:  Diagnosis Date   Arthritis    Asthma    Colon polyps    Gout    Hay fever    Hyperlipidemia    Hypertension    Positive TB test     Patient Active Problem List   Diagnosis Date Noted   Dermoid cyst 10/13/2017   Lower urinary tract symptoms (LUTS) 12/29/2013   Gout 12/29/2013   Hypokalemia 12/29/2013   Erectile dysfunction 12/29/2013   Hyperlipidemia    Hypertension     Past Surgical History:  Procedure Laterality Date   COLONOSCOPY     HERNIA REPAIR  1998   TOOTH EXTRACTION     WRIST SURGERY  1964       Family History  Problem Relation Age of Onset   Arthritis Mother    Bladder Cancer Mother    Hyperlipidemia Mother    Heart disease Mother    Hypertension Mother    Arthritis Father    Cancer Father    Hyperlipidemia Father    Heart disease Father    Hypertension Father    Hyperlipidemia Sister    Hypertension Sister    Heart disease Sister    Arthritis Maternal Grandmother    Arthritis Maternal Grandfather    Arthritis Paternal Grandmother    Arthritis Paternal Grandfather    Prostate cancer Neg Hx     Social History   Tobacco Use   Smoking status: Every Day    Pack years: 0.00    Types: Cigars   Smokeless tobacco: Never  Vaping Use   Vaping Use: Never used  Substance Use Topics   Alcohol use: Yes    Comment: 2 drinks a day   Drug use: No     Home Medications Prior to Admission medications   Medication Sig Start Date End Date Taking? Authorizing Provider  ondansetron (ZOFRAN ODT) 4 MG disintegrating tablet Take 1 tablet (4 mg total) by mouth every 8 (eight) hours as needed for nausea or vomiting. 06/08/21  Yes Isla Pence, MD    Allergies    Patient has no known allergies.  Review of Systems   Review of Systems  Gastrointestinal:  Positive for abdominal pain and nausea.  Genitourinary:  Positive for difficulty urinating.  All other systems reviewed and are negative.  Physical Exam Updated Vital Signs BP (!) 176/100   Pulse (!) 107   Temp 98 F (36.7 C) (Oral) Comment: Simultaneous filing. User may not have seen previous data. Comment (Src): Simultaneous filing. User may not have seen previous data.  Resp 18   Ht 6\' 2"  (1.88 m)   Wt 96.2 kg   SpO2 99%   BMI 27.22 kg/m   Physical Exam Vitals and nursing note reviewed.  Constitutional:      Appearance: He  is well-developed.  HENT:     Head: Normocephalic and atraumatic.     Mouth/Throat:     Mouth: Mucous membranes are moist.     Pharynx: Oropharynx is clear.  Eyes:     Extraocular Movements: Extraocular movements intact.     Pupils: Pupils are equal, round, and reactive to light.  Cardiovascular:     Rate and Rhythm: Normal rate and regular rhythm.  Pulmonary:     Effort: Pulmonary effort is normal.     Breath sounds: Normal breath sounds.  Abdominal:     General: Abdomen is flat. Bowel sounds are normal.     Palpations: Abdomen is soft.     Tenderness: There is abdominal tenderness in the suprapubic area.  Skin:    General: Skin is warm.     Capillary Refill: Capillary refill takes less than 2 seconds.  Neurological:     General: No focal deficit present.     Mental Status: He is alert and oriented to person, place, and time.  Psychiatric:        Mood and Affect: Mood normal.        Behavior: Behavior normal.    ED Results / Procedures /  Treatments   Labs (all labs ordered are listed, but only abnormal results are displayed) Labs Reviewed  CBC WITH DIFFERENTIAL/PLATELET - Abnormal; Notable for the following components:      Result Value   WBC 13.0 (*)    Neutro Abs 11.1 (*)    All other components within normal limits  COMPREHENSIVE METABOLIC PANEL - Abnormal; Notable for the following components:   Potassium 3.0 (*)    CO2 21 (*)    Glucose, Bld 153 (*)    Calcium 10.6 (*)    Total Protein 8.7 (*)    Total Bilirubin 1.3 (*)    All other components within normal limits  URINALYSIS, ROUTINE W REFLEX MICROSCOPIC - Abnormal; Notable for the following components:   Hgb urine dipstick SMALL (*)    Protein, ur 30 (*)    Leukocytes,Ua SMALL (*)    All other components within normal limits  LIPASE, BLOOD    EKG None  Radiology No results found.  Procedures Procedures   Medications Ordered in ED Medications  potassium chloride SA (KLOR-CON) CR tablet 40 mEq (has no administration in time range)  sodium chloride 0.9 % bolus 1,000 mL (1,000 mLs Intravenous New Bag/Given 06/08/21 0929)  ondansetron (ZOFRAN) injection 4 mg (4 mg Intravenous Given 06/08/21 0929)  morphine 4 MG/ML injection 4 mg (4 mg Intravenous Given 06/08/21 0930)    ED Course  I have reviewed the triage vital signs and the nursing notes.  Pertinent labs & imaging results that were available during my care of the patient were reviewed by me and considered in my medical decision making (see chart for details).    MDM Rules/Calculators/A&P                          Bladder scan revealed a PVR of 880 ml.  Foley ordered and placed.  Large amt of urine in foley.  Pt is feeling much better.  He is tolerating fluids.    K is slightly low.  He is given 40 meq prior to d/c.  BP is elevated.  He needs to f/u with pcp.  Pt to f/u with urology.  Return if worse.  Final Clinical Impression(s) / ED Diagnoses Final diagnoses:  Urinary retention  Nausea  Hypokalemia  Hypertension, unspecified type    Rx / DC Orders ED Discharge Orders          Ordered    ondansetron (ZOFRAN ODT) 4 MG disintegrating tablet  Every 8 hours PRN        06/08/21 1109             Isla Pence, MD 06/08/21 1113

## 2021-06-08 NOTE — ED Triage Notes (Signed)
Pt c/o lower abd pain and "burning" pain in abdomen since last night. Pt states he had foley removed on Tuesday. Pt c/o nausea, denies v/d and denies urinary issues

## 2021-06-08 NOTE — ED Notes (Signed)
Pt has 2057ml in foley bag, emptied (1031ml) from foley bag

## 2021-06-08 NOTE — ED Notes (Signed)
Bladder scan: 857ml

## 2021-06-08 NOTE — ED Notes (Signed)
Pt requested to keep foley bag on instead of leg bag d/t large urine output. Instructed pt on how to empty foley bag, to keep bag below bladder and how to change bag to a leg bag. Instructed pt how to hook up leg bag to foley catheter, pt recently had foley and is familiar w leg bag and how to empty it and adjust leg straps. Pt understood, demonstrated to this RN how to empty foley bag and re-clamp. Pt advised to call his urologist today and inform them that he had a foley placed and will have to follow-up w them on when to remove it. Rx sent to pharmacy. Pt understood, ambulated independently, steady gait, A&O X4, GCS 15

## 2021-06-08 NOTE — Discharge Instructions (Addendum)
Your blood pressure is elevated today.  You will need to follow up with your pcp to get that rechecked.    You will need to follow up with the Urologist regarding your difficulty urinating.

## 2021-06-13 ENCOUNTER — Ambulatory Visit: Payer: Federal, State, Local not specified - PPO | Admitting: Family Medicine

## 2021-06-13 ENCOUNTER — Encounter: Payer: Self-pay | Admitting: Family Medicine

## 2021-06-13 ENCOUNTER — Other Ambulatory Visit: Payer: Self-pay

## 2021-06-13 VITALS — BP 136/84 | HR 94 | Temp 97.7°F | Ht 74.0 in | Wt 212.2 lb

## 2021-06-13 DIAGNOSIS — N401 Enlarged prostate with lower urinary tract symptoms: Secondary | ICD-10-CM

## 2021-06-13 DIAGNOSIS — R338 Other retention of urine: Secondary | ICD-10-CM

## 2021-06-13 DIAGNOSIS — I10 Essential (primary) hypertension: Secondary | ICD-10-CM

## 2021-06-13 DIAGNOSIS — E785 Hyperlipidemia, unspecified: Secondary | ICD-10-CM | POA: Diagnosis not present

## 2021-06-13 DIAGNOSIS — M109 Gout, unspecified: Secondary | ICD-10-CM | POA: Diagnosis not present

## 2021-06-13 NOTE — Progress Notes (Signed)
Provider:  Alain Honey, MD  Careteam: Patient Care Team: Lucianne Lei, MD as PCP - General (Family Medicine)  PLACE OF SERVICE:  [place] Directive information    No Known Allergies  Chief Complaint  Patient presents with   New Patient (Initial Visit)    Patient presents today for new patient appointment.     HPI: Patient is a 75 y.o. male new patient who was recently seen in the emergency room for urinary retention.  At that time Foley cath was placed and he presents today to possibly have it removed.  This was the second occurrence of urinary retention.  He was seen by urology who he has history says that his prostate was swollen.  He was placed on antibiotic as well as finasteride and tamsulosin.  PSA was elevated to greater than 8. He also has history of hypertension hypercholesterolemia gout.  He did not bring medicines with him today but tells me that he takes medicine for blood pressure as well as cholesterol and gout.  Review of Systems:  Review of Systems  Constitutional: Negative.   Eyes: Negative.   Respiratory: Negative.    Cardiovascular: Negative.   Genitourinary:  Positive for flank pain.  Neurological: Negative.   Psychiatric/Behavioral: Negative.    All other systems reviewed and are negative.  Past Medical History:  Diagnosis Date   Arthritis    Asthma    Colon polyps    Gout    Hay fever    Hyperlipidemia    Hypertension    Positive TB test    Past Surgical History:  Procedure Laterality Date   COLONOSCOPY     HERNIA REPAIR  1998   TOOTH EXTRACTION     WRIST SURGERY  1964   Social History:   reports that he has been smoking cigars. He has never used smokeless tobacco. He reports previous alcohol use. He reports current drug use. Drug: Marijuana.  Family History  Problem Relation Age of Onset   Arthritis Mother    Hyperlipidemia Mother    Heart disease Mother    Hypertension Mother    Uterine cancer Mother     Arthritis Father    Hyperlipidemia Father    Heart disease Father    Hypertension Father    Tuberculosis Father    Hyperlipidemia Sister    Hypertension Sister    Heart disease Sister    Arthritis Maternal Grandmother    Arthritis Maternal Grandfather    Arthritis Paternal Grandmother    Arthritis Paternal Grandfather    Prostate cancer Neg Hx     Medications: Patient's Medications  New Prescriptions   No medications on file  Previous Medications   ONDANSETRON (ZOFRAN ODT) 4 MG DISINTEGRATING TABLET    Take 1 tablet (4 mg total) by mouth every 8 (eight) hours as needed for nausea or vomiting.  Modified Medications   No medications on file  Discontinued Medications   No medications on file    Physical Exam:  Vitals:   06/13/21 0910  Weight: 212 lb 3.2 oz (96.3 kg)  Height: 6\' 2"  (1.88 m)   Body mass index is 27.24 kg/m. Wt Readings from Last 3 Encounters:  06/13/21 212 lb 3.2 oz (96.3 kg)  06/08/21 212 lb (96.2 kg)  04/27/21 215 lb (97.5 kg)    Physical Exam Vitals reviewed.  Constitutional:      Appearance: Normal appearance.  Cardiovascular:     Rate and Rhythm: Normal rate and regular  rhythm.  Pulmonary:     Effort: Pulmonary effort is normal.     Breath sounds: Normal breath sounds.  Abdominal:     General: Abdomen is flat. Bowel sounds are normal.     Palpations: Abdomen is soft.  Genitourinary:    Penis: Normal.      Comments: Foley cath in place.  Balloon was deflated and cath easily removed. Musculoskeletal:        General: Normal range of motion.     Cervical back: Normal range of motion.  Neurological:     General: No focal deficit present.     Mental Status: He is alert and oriented to person, place, and time.    Labs reviewed: Basic Metabolic Panel: Recent Labs    04/27/21 1107 06/08/21 0934  NA 140 140  K 3.1* 3.0*  CL 103 106  CO2 22 21*  GLUCOSE 176* 153*  BUN 15 21  CREATININE 1.30* 1.21  CALCIUM 10.1 10.6*   Liver  Function Tests: Recent Labs    06/08/21 0934  AST 25  ALT 20  ALKPHOS 78  BILITOT 1.3*  PROT 8.7*  ALBUMIN 5.0   Recent Labs    06/08/21 0934  LIPASE 35   No results for input(s): AMMONIA in the last 8760 hours. CBC: Recent Labs    04/27/21 1107 06/08/21 0934  WBC 16.7* 13.0*  NEUTROABS 15.1* 11.1*  HGB 17.7* 15.9  HCT 51.3 45.6  MCV 91.9 91.9  PLT 296 299   Lipid Panel: No results for input(s): CHOL, HDL, LDLCALC, TRIG, CHOLHDL, LDLDIRECT in the last 8760 hours. TSH: No results for input(s): TSH in the last 8760 hours. A1C: No results found for: HGBA1C   Assessment/Plan  1. Urinary retention due to benign prostatic hyperplasia Foley cath removed today.  Patient instructed to drink plenty of fluids.  We will follow-up in 1 month and repeat PSA and do prostate exam  2. Hyperlipidemia, unspecified hyperlipidemia type Currently taking Crestor most recent LDL was 98.  3. Primary hypertension Taking amlodipine.  Blood pressure today is 136/84  4. Gout of foot, unspecified cause, unspecified chronicity, unspecified laterality He takes allopurinol as a maintenance medicine.  Does not recall last flare.  He does realize there is a connection with use of alcohol and flare of gout   Alain Honey, MD [place] [phone]

## 2021-06-13 NOTE — Patient Instructions (Signed)
Continue to drink plenty of water Take medicines as before

## 2021-06-14 ENCOUNTER — Emergency Department (HOSPITAL_COMMUNITY)
Admission: EM | Admit: 2021-06-14 | Discharge: 2021-06-15 | Discharge status: Absent without leave | Payer: Federal, State, Local not specified - PPO

## 2021-06-14 ENCOUNTER — Other Ambulatory Visit: Payer: Self-pay

## 2021-06-15 NOTE — ED Notes (Signed)
Called for triage. No answer. 

## 2021-06-16 ENCOUNTER — Telehealth: Payer: Self-pay | Admitting: *Deleted

## 2021-06-16 MED ORDER — VALSARTAN-HYDROCHLOROTHIAZIDE 320-25 MG PO TABS: 1.0000 | ORAL_TABLET | Freq: Every day | ORAL | 1 refills | Status: DC

## 2021-06-16 MED ORDER — ALLOPURINOL 300 MG PO TABS: 300.0000 mg | ORAL_TABLET | Freq: Every day | ORAL | 1 refills | Status: DC

## 2021-06-16 MED ORDER — ROSUVASTATIN CALCIUM 10 MG PO TABS: 10.0000 mg | ORAL_TABLET | Freq: Every day | ORAL | 1 refills | Status: DC

## 2021-06-16 NOTE — Telephone Encounter (Signed)
May refill Valsartan HCTZ per patient request.please bring all your medication bottles to be verified by Nurse at the office next week as soon as possible.

## 2021-06-16 NOTE — Telephone Encounter (Signed)
Patient called and stated that he is a new patient with our practice and he was just seen this week. Patient is requesting refills on medications:   Allopurinol 300 Valsartan HCTZ 320/25 Rosuvastatin 10  Valsartan is not in patient's medication list. Lisinopril HCTZ is but patient stated that is an old medication and he is NOT taking that one.   Is is ok to change to Valsartan HCTZ in medication list and send Rx.   Please Advise. (Dr. Sabra Heck is not in office forwarded to [facility])

## 2021-06-16 NOTE — Telephone Encounter (Signed)
Patient aware to bring medications.  Medication list updated and Rx's sent to pharmacy.

## 2021-06-19 NOTE — Telephone Encounter (Signed)
Patient came in office today, 06/19/2021 and medication was reviewed. He also stated he never received verification that medication was send into pharmacy and I advised patient that his Allopurinol, Rosuvastatin and Diovan-HCT was send into pharmacy on 06/16/2021.  Also he left a urine sample to be send for culture. Urine was gave to lab for culture.

## 2021-06-19 NOTE — Addendum Note (Signed)
Addended by: Casimer Leek C on: 06/19/2021 10:06 AM   Modules accepted: Orders

## 2021-06-21 LAB — URINE CULTURE
MICRO NUMBER:: 12105141
Result:: NO GROWTH
SPECIMEN QUALITY:: ADEQUATE

## 2021-07-18 ENCOUNTER — Ambulatory Visit: Payer: Federal, State, Local not specified - PPO | Admitting: Family Medicine

## 2021-07-18 ENCOUNTER — Emergency Department (HOSPITAL_COMMUNITY)
Admission: EM | Admit: 2021-07-18 | Discharge: 2021-07-18 | Disposition: A | Discharge status: Home / Self Care | Payer: Federal, State, Local not specified - PPO | Attending: Emergency Medicine | Admitting: Emergency Medicine

## 2021-07-18 ENCOUNTER — Encounter (HOSPITAL_COMMUNITY): Payer: Self-pay | Admitting: *Deleted

## 2021-07-18 ENCOUNTER — Other Ambulatory Visit: Payer: Self-pay

## 2021-07-18 DIAGNOSIS — I10 Essential (primary) hypertension: Secondary | ICD-10-CM | POA: Insufficient documentation

## 2021-07-18 DIAGNOSIS — Z79899 Other long term (current) drug therapy: Secondary | ICD-10-CM | POA: Diagnosis not present

## 2021-07-18 DIAGNOSIS — R339 Retention of urine, unspecified: Secondary | ICD-10-CM | POA: Diagnosis not present

## 2021-07-18 DIAGNOSIS — F17290 Nicotine dependence, other tobacco product, uncomplicated: Secondary | ICD-10-CM | POA: Diagnosis not present

## 2021-07-18 DIAGNOSIS — J45909 Unspecified asthma, uncomplicated: Secondary | ICD-10-CM | POA: Diagnosis not present

## 2021-07-18 DIAGNOSIS — Z7951 Long term (current) use of inhaled steroids: Secondary | ICD-10-CM | POA: Diagnosis not present

## 2021-07-18 DIAGNOSIS — R1084 Generalized abdominal pain: Secondary | ICD-10-CM | POA: Diagnosis present

## 2021-07-18 LAB — URINALYSIS, ROUTINE W REFLEX MICROSCOPIC
Bilirubin Urine: NEGATIVE
Glucose, UA: NEGATIVE mg/dL
Ketones, ur: NEGATIVE mg/dL
Leukocytes,Ua: NEGATIVE
Nitrite: NEGATIVE
Protein, ur: 30 mg/dL — AB
RBC / HPF: >50 RBC/hpf — ABNORMAL HIGH (ref 0–5)
Specific Gravity, Urine: 1.01 (ref 1.005–1.030)
pH: 6 (ref 5.0–8.0)

## 2021-07-18 LAB — CBC WITH DIFFERENTIAL/PLATELET
Abs Immature Granulocytes: 0.06 10*3/uL (ref 0.00–0.07)
Basophils Absolute: 0 10*3/uL (ref 0.0–0.1)
Basophils Relative: 0 %
Eosinophils Absolute: 0 10*3/uL (ref 0.0–0.5)
Eosinophils Relative: 0 %
HCT: 44 % (ref 39.0–52.0)
Hemoglobin: 15.5 g/dL (ref 13.0–17.0)
Immature Granulocytes: 1 %
Lymphocytes Relative: 7 %
Lymphs Abs: 0.9 10*3/uL (ref 0.7–4.0)
MCH: 32 pg (ref 26.0–34.0)
MCHC: 35.2 g/dL (ref 30.0–36.0)
MCV: 90.7 fL (ref 80.0–100.0)
Monocytes Absolute: 0.6 10*3/uL (ref 0.1–1.0)
Monocytes Relative: 5 %
Neutro Abs: 11.3 10*3/uL — ABNORMAL HIGH (ref 1.7–7.7)
Neutrophils Relative %: 87 %
Platelets: 309 10*3/uL (ref 150–400)
RBC: 4.85 MIL/uL (ref 4.22–5.81)
RDW: 13.7 % (ref 11.5–15.5)
WBC: 12.9 10*3/uL — ABNORMAL HIGH (ref 4.0–10.5)
nRBC: 0 % (ref 0.0–0.2)

## 2021-07-18 LAB — COMPREHENSIVE METABOLIC PANEL
ALT: 23 U/L (ref 0–44)
AST: 29 U/L (ref 15–41)
Albumin: 4.9 g/dL (ref 3.5–5.0)
Alkaline Phosphatase: 64 U/L (ref 38–126)
Anion gap: 14 (ref 5–15)
BUN: 15 mg/dL (ref 8–23)
CO2: 22 mmol/L (ref 22–32)
Calcium: 10.6 mg/dL — ABNORMAL HIGH (ref 8.9–10.3)
Chloride: 103 mmol/L (ref 98–111)
Creatinine, Ser: 0.97 mg/dL (ref 0.61–1.24)
GFR, Estimated: >60 mL/min (ref >60)
Glucose, Bld: 138 mg/dL — ABNORMAL HIGH (ref 70–99)
Potassium: 3 mmol/L — ABNORMAL LOW (ref 3.5–5.1)
Sodium: 139 mmol/L (ref 135–145)
Total Bilirubin: 1.3 mg/dL — ABNORMAL HIGH (ref 0.3–1.2)
Total Protein: 8.6 g/dL — ABNORMAL HIGH (ref 6.5–8.1)

## 2021-07-18 MED ORDER — POTASSIUM CHLORIDE CRYS ER 20 MEQ PO TBCR
40.0000 meq | EXTENDED_RELEASE_TABLET | Freq: Once | ORAL | Status: AC
  Administered 2021-07-18: 40 meq via ORAL
  Filled 2021-07-18: qty 2

## 2021-07-18 NOTE — Discharge Instructions (Addendum)
Contact the urologist and let them know you had the Foley placed back in.  You can follow-up either this week or your next appointment next week

## 2021-07-18 NOTE — ED Triage Notes (Signed)
Pt reports inability to empty bladder. He removed foley yesterday and was able to urinate initially yesterday but now feels unable. Reports enlarged prostate.

## 2021-07-18 NOTE — ED Provider Notes (Signed)
[name] DEPT Provider Note   CSN: [id number] Arrival date & time: 07/18/21  0800     History Chief Complaint  Patient presents with   Urinary Retention    Edward Wolf is a 75 y.o. male.  Patient has an enlarged prostate.  Patient had his Foley taken out yesterday and cannot urinate today.  The history is provided by the patient and medical records. A language interpreter was used.  Abdominal Pain Pain location:  Generalized Pain quality: aching   Pain radiates to:  Does not radiate Pain severity:  Moderate Onset quality:  Sudden Duration:  12 hours Timing:  Constant Progression:  Worsening Chronicity:  Recurrent Associated symptoms: no chest pain, no cough, no diarrhea, no fatigue and no hematuria       Past Medical History:  Diagnosis Date   Arthritis    Asthma    Colon polyps    Gout    Hay fever    Hyperlipidemia    Hypertension    Positive TB test    Urinary retention with incomplete bladder emptying     Patient Active Problem List   Diagnosis Date Noted   Dermoid cyst 10/13/2017   Lower urinary tract symptoms (LUTS) 12/29/2013   Gout 12/29/2013   Hypokalemia 12/29/2013   Erectile dysfunction 12/29/2013   Hyperlipidemia    Hypertension     Past Surgical History:  Procedure Laterality Date   COLONOSCOPY     HERNIA REPAIR  1998   TOOTH EXTRACTION     WRIST SURGERY  1964       Family History  Problem Relation Age of Onset   Arthritis Mother    Hyperlipidemia Mother    Heart disease Mother    Hypertension Mother    Uterine cancer Mother    Arthritis Father    Hyperlipidemia Father    Heart disease Father    Hypertension Father    Tuberculosis Father    Hyperlipidemia Sister    Hypertension Sister    Heart disease Sister    Arthritis Maternal Grandmother    Arthritis Maternal Grandfather    Arthritis Paternal Grandmother    Arthritis Paternal Grandfather    Prostate cancer Neg Hx     Social  History   Tobacco Use   Smoking status: Every Day    Types: Cigars   Smokeless tobacco: Never  Vaping Use   Vaping Use: Never used  Substance Use Topics   Alcohol use: Not Currently    Comment: Stop drinking on May 10, 2021   Drug use: Yes    Types: Marijuana    Comment: CBD    Home Medications Prior to Admission medications   Medication Sig Start Date End Date Taking? Authorizing Provider  acetaminophen (TYLENOL) 500 MG tablet Take 500 mg by mouth every 6 (six) hours as needed for mild pain.   Yes [provider]  allopurinol (ZYLOPRIM) 300 MG tablet Take 1 tablet (300 mg total) by mouth daily. 06/16/21  Yes Ngetich, Dinah C, NP  amLODipine (NORVASC) 10 MG tablet Take 10 mg by mouth daily.   Yes [provider]  finasteride (PROSCAR) 5 MG tablet Take 5 mg by mouth daily. 07/10/21  Yes [provider]  fluconazole (DIFLUCAN) 150 MG tablet Take 150 mg by mouth once. 07/17/21  Yes [provider]  minocycline (MINOCIN) 100 MG capsule Take 100 mg by mouth daily as needed (urinary problems).   Yes [provider]  Multiple Vitamin (MULTIVITAMIN) tablet  Take 1 tablet by mouth every other day.   Yes [provider]  potassium chloride 20 MEQ/15ML (10%) SOLN Take 20 mEq by mouth every other day.   Yes [provider]  rosuvastatin (CRESTOR) 10 MG tablet Take 1 tablet (10 mg total) by mouth daily. 06/16/21  Yes Ngetich, Dinah C, NP  tamsulosin (FLOMAX) 0.4 MG CAPS capsule Take 0.4 mg by mouth daily after breakfast.   Yes [provider]  valsartan-hydrochlorothiazide (DIOVAN-HCT) 320-25 MG tablet Take 1 tablet by mouth daily. 06/16/21  Yes Ngetich, Dinah C, NP    Allergies    Patient has no known allergies.  Review of Systems   Review of Systems  Constitutional:  Negative for appetite change and fatigue.  HENT:  Negative for congestion, ear discharge and sinus pressure.   Eyes:  Negative for discharge.  Respiratory:   Negative for cough.   Cardiovascular:  Negative for chest pain.  Gastrointestinal:  Positive for abdominal pain. Negative for diarrhea.  Genitourinary:  Negative for frequency and hematuria.  Musculoskeletal:  Negative for back pain.  Skin:  Negative for rash.  Neurological:  Negative for seizures and headaches.  Psychiatric/Behavioral:  Negative for hallucinations.    Physical Exam Updated Vital Signs BP (!) 167/97 (BP Location: Left Arm)   Pulse (!) 104   Temp 98.6 F (37 C) (Oral)   Resp 16   SpO2 98%   Physical Exam Vitals and nursing note reviewed.  Constitutional:      Appearance: He is well-developed.  HENT:     Head: Normocephalic.     Nose: Nose normal.  Eyes:     General: No scleral icterus.    Conjunctiva/sclera: Conjunctivae normal.  Neck:     Thyroid: No thyromegaly.  Cardiovascular:     Rate and Rhythm: Normal rate and regular rhythm.     Heart sounds: No murmur heard.   No friction rub. No gallop.  Pulmonary:     Breath sounds: No stridor. No wheezing or rales.  Chest:     Chest wall: No tenderness.  Abdominal:     General: There is distension.     Tenderness: There is abdominal tenderness. There is no rebound.  Musculoskeletal:        General: Normal range of motion.     Cervical back: Neck supple.  Lymphadenopathy:     Cervical: No cervical adenopathy.  Skin:    Findings: No erythema or rash.  Neurological:     Mental Status: He is alert and oriented to person, place, and time.     Motor: No abnormal muscle tone.     Coordination: Coordination normal.  Psychiatric:        Behavior: Behavior normal.    ED Results / Procedures / Treatments   Labs (all labs ordered are listed, but only abnormal results are displayed) Labs Reviewed  URINALYSIS, ROUTINE W REFLEX MICROSCOPIC - Abnormal; Notable for the following components:      Result Value   Hgb urine dipstick LARGE (*)    Protein, ur 30 (*)    RBC / HPF >50 (*)    Bacteria, UA RARE (*)     All other components within normal limits  CBC WITH DIFFERENTIAL/PLATELET - Abnormal; Notable for the following components:   WBC 12.9 (*)    Neutro Abs 11.3 (*)    All other components within normal limits  COMPREHENSIVE METABOLIC PANEL - Abnormal; Notable for the following components:   Potassium 3.0 (*)  Glucose, Bld 138 (*)    Calcium 10.6 (*)    Total Protein 8.6 (*)    Total Bilirubin 1.3 (*)    All other components within normal limits  URINE CULTURE    EKG None  Radiology No results found.  Procedures Procedures   Medications Ordered in ED Medications  potassium chloride SA (KLOR-CON) CR tablet 40 mEq (has no administration in time range)    ED Course  I have reviewed the triage vital signs and the nursing notes.  Pertinent labs & imaging results that were available during my care of the patient were reviewed by me and considered in my medical decision making (see chart for details).    MDM Rules/Calculators/A&P                           Patient with urinary obstruction secondary to enlarged prostate.  Foley placed without problems.  Patient will have urine culture done.  And he will follow-up with urology Final Clinical Impression(s) / ED Diagnoses Final diagnoses:  None    Rx / DC Orders ED Discharge Orders     None        Milton Ferguson, MD 07/23/21 [phone]

## 2021-07-19 LAB — URINE CULTURE: Culture: NO GROWTH

## 2021-07-24 ENCOUNTER — Other Ambulatory Visit: Payer: Self-pay

## 2021-07-24 ENCOUNTER — Ambulatory Visit: Payer: Federal, State, Local not specified - PPO | Admitting: Podiatry

## 2021-07-24 DIAGNOSIS — Q828 Other specified congenital malformations of skin: Secondary | ICD-10-CM | POA: Diagnosis not present

## 2021-07-24 DIAGNOSIS — M79672 Pain in left foot: Secondary | ICD-10-CM

## 2021-07-25 ENCOUNTER — Encounter: Payer: Self-pay | Admitting: Family Medicine

## 2021-07-25 ENCOUNTER — Ambulatory Visit: Payer: Federal, State, Local not specified - PPO | Admitting: Family Medicine

## 2021-07-25 VITALS — BP 128/82 | HR 93 | Temp 97.3°F | Ht 74.0 in | Wt 212.4 lb

## 2021-07-25 DIAGNOSIS — E785 Hyperlipidemia, unspecified: Secondary | ICD-10-CM | POA: Diagnosis not present

## 2021-07-25 DIAGNOSIS — M109 Gout, unspecified: Secondary | ICD-10-CM

## 2021-07-25 DIAGNOSIS — I10 Essential (primary) hypertension: Secondary | ICD-10-CM

## 2021-07-25 DIAGNOSIS — R399 Unspecified symptoms and signs involving the genitourinary system: Secondary | ICD-10-CM | POA: Diagnosis not present

## 2021-07-25 LAB — COMPLETE METABOLIC PANEL WITH GFR
AG Ratio: 1.8 (calc) (ref 1.0–2.5)
ALT: 13 U/L (ref 9–46)
AST: 11 U/L (ref 10–35)
Albumin: 4.6 g/dL (ref 3.6–5.1)
Alkaline phosphatase (APISO): 67 U/L (ref 35–144)
BUN: 11 mg/dL (ref 7–25)
CO2: 27 mmol/L (ref 20–32)
Calcium: 10.1 mg/dL (ref 8.6–10.3)
Chloride: 104 mmol/L (ref 98–110)
Creat: 0.78 mg/dL (ref 0.70–1.28)
Globulin: 2.6 g/dL (calc) (ref 1.9–3.7)
Glucose, Bld: 94 mg/dL (ref 65–99)
Potassium: 3.3 mmol/L — ABNORMAL LOW (ref 3.5–5.3)
Sodium: 140 mmol/L (ref 135–146)
Total Bilirubin: 0.7 mg/dL (ref 0.2–1.2)
Total Protein: 7.2 g/dL (ref 6.1–8.1)
eGFR: 94 mL/min/{1.73_m2} (ref >=60)

## 2021-07-25 LAB — LIPID PANEL
Cholesterol: 165 mg/dL (ref <200)
HDL: 44 mg/dL (ref >=40)
LDL Cholesterol (Calc): 102 mg/dL (calc) — ABNORMAL HIGH
Non-HDL Cholesterol (Calc): 121 mg/dL (calc) (ref <130)
Total CHOL/HDL Ratio: 3.8 (calc) (ref <5.0)
Triglycerides: 94 mg/dL (ref <150)

## 2021-07-25 MED ORDER — MINOCYCLINE HCL 100 MG PO CAPS: 100.0000 mg | ORAL_CAPSULE | Freq: Every day | ORAL | 1 refills | Status: DC | PRN

## 2021-07-25 NOTE — Progress Notes (Signed)
Provider:  Alain Honey, MD  Careteam: Patient Care Team: Wardell Honour, MD as PCP - General (Family Medicine)  PLACE OF SERVICE:  [facility] Directive information    No Known Allergies  No chief complaint on file.    HPI: Patient is a 75 y.o. male he was scheduled for an appointment here last week but went to the emergency room instead with urinary retention.  At that time had another Foley catheter was placed and he continues with that.  He has an appointment to follow-up with urology in 2 weeks and the plan is to keep that catheter in place until then.  It would seem that he would be a good candidate for a TURP but that is up to the discretion of the urologist. He is doing well from gout since he stopped drinking bourbon in June.  Also takes allopurinol. In reviewing his medicines and labs, he takes rosuvastatin but lipids have not been assessed in 6 years. Also has a low potassium on last metabolic labs and that needs to be reassessed.  He may not be needing potassium supplement since he is not on a diuretic.  Review of Systems:  Review of Systems  Genitourinary:  Positive for dysuria.  All other systems reviewed and are negative.  Past Medical History:  Diagnosis Date   Arthritis    Asthma    Colon polyps    Gout    Hay fever    Hyperlipidemia    Hypertension    Positive TB test    Urinary retention with incomplete bladder emptying    Past Surgical History:  Procedure Laterality Date   COLONOSCOPY     HERNIA REPAIR  1998   TOOTH EXTRACTION     WRIST SURGERY  1964   Social History:   reports that he has been smoking cigars. He has never used smokeless tobacco. He reports that he does not currently use alcohol. He reports current drug use. Drug: Marijuana.  Family History  Problem Relation Age of Onset   Arthritis Mother    Hyperlipidemia Mother    Heart disease Mother    Hypertension Mother    Uterine cancer Mother    Arthritis Father     Hyperlipidemia Father    Heart disease Father    Hypertension Father    Tuberculosis Father    Hyperlipidemia Sister    Hypertension Sister    Heart disease Sister    Arthritis Maternal Grandmother    Arthritis Maternal Grandfather    Arthritis Paternal Grandmother    Arthritis Paternal Grandfather    Prostate cancer Neg Hx     Medications: Patient's Medications  New Prescriptions   No medications on file  Previous Medications   ACETAMINOPHEN (TYLENOL) 500 MG TABLET    Take 500 mg by mouth every 6 (six) hours as needed for mild pain.   ALLOPURINOL (ZYLOPRIM) 300 MG TABLET    Take 1 tablet (300 mg total) by mouth daily.   AMLODIPINE (NORVASC) 10 MG TABLET    Take 10 mg by mouth daily.   FINASTERIDE (PROSCAR) 5 MG TABLET    Take 5 mg by mouth daily.   FLUCONAZOLE (DIFLUCAN) 150 MG TABLET    Take 150 mg by mouth once.   MINOCYCLINE (MINOCIN) 100 MG CAPSULE    Take 100 mg by mouth daily as needed (urinary problems).   MULTIPLE VITAMIN (MULTIVITAMIN) TABLET    Take 1 tablet by mouth every other day.  POTASSIUM CHLORIDE 20 MEQ/15ML (10%) SOLN    Take 20 mEq by mouth every other day.   ROSUVASTATIN (CRESTOR) 10 MG TABLET    Take 1 tablet (10 mg total) by mouth daily.   TAMSULOSIN (FLOMAX) 0.4 MG CAPS CAPSULE    Take 0.4 mg by mouth daily after breakfast.   VALSARTAN-HYDROCHLOROTHIAZIDE (DIOVAN-HCT) 320-25 MG TABLET    Take 1 tablet by mouth daily.  Modified Medications   No medications on file  Discontinued Medications   No medications on file    Physical Exam:  Vitals:   07/25/21 0827  BP: 128/82  Pulse: 93  Temp: (!) 97.3 F (36.3 C)  TempSrc: Skin  SpO2: 98%  Weight: 212 lb 6.4 oz (96.3 kg)  Height: _0  (1.88 m)   Body mass index is 27.27 kg/m. Wt Readings from Last 3 Encounters:  07/25/21 212 lb 6.4 oz (96.3 kg)  06/13/21 212 lb 3.2 oz (96.3 kg)  06/08/21 212 lb (96.2 kg)    Physical Exam Vitals and nursing note reviewed.  Constitutional:       Appearance: Normal appearance.  Cardiovascular:     Rate and Rhythm: Normal rate and regular rhythm.  Pulmonary:     Effort: Pulmonary effort is normal.     Breath sounds: Normal breath sounds.  Skin:    Comments: Saw podiatry yesterday.  Has 2 wounds on his left foot 1 over the first metatarsal head that he says is related to gout.  That is healing by history.  Also has area over the third fourth metatarsal head that was pared yesterday by podiatry.  It is alleged to be plantar wart  Neurological:     General: No focal deficit present.     Mental Status: He is alert and oriented to person, place, and time.    Labs reviewed: Basic Metabolic Panel: Recent Labs    04/27/21 1107 06/08/21 0934 07/18/21 0850  NA 140 140 139  K 3.1* 3.0* 3.0*  CL 103 106 103  CO2 22 21* 22  GLUCOSE 176* 153* 138*  BUN _1 CREATININE 1.30* 1.21 0.97  CALCIUM 10.1 10.6* 10.6*   Liver Function Tests: Recent Labs    06/08/21 0934 07/18/21 0850  AST 25 29  ALT 20 23  ALKPHOS 78 64  BILITOT 1.3* 1.3*  PROT 8.7* 8.6*  ALBUMIN 5.0 4.9   Recent Labs    06/08/21 0934  LIPASE 35   No results for input(s): AMMONIA in the last 8760 hours. CBC: Recent Labs    04/27/21 1107 06/08/21 0934 07/18/21 0850  WBC 16.7* 13.0* 12.9*  NEUTROABS 15.1* 11.1* 11.3*  HGB 17.7* 15.9 15.5  HCT 51.3 45.6 44.0  MCV 91.9 91.9 90.7  PLT 296 299 309   Lipid Panel: No results for input(s): CHOL, HDL, LDLCALC, TRIG, CHOLHDL, LDLDIRECT in the last 8760 hours. TSH: No results for input(s): TSH in the last 8760 hours. A1C: No results found for: HGBA1C   Assessment/Plan   1. Hyperlipidemia, unspecified hyperlipidemia type No issues with Crestor but needs reassessment of lipids - Lipid Panel  2. Primary hypertension Blood pressure is good today on 2 drug regimen, amlodipine and valsartan hydrochlorothiazide - CMP with eGFR([facility])  3. Gout of foot, unspecified cause, unspecified chronicity,  unspecified laterality Gout has improved off alcohol.  Patient encouraged to maintain that behavior  4. Lower urinary tract symptoms (LUTS) Continues with Foley catheter hopefully urologist will agree with necessity of TURP  Alain Honey, MD  [facility] [phone]

## 2021-07-25 NOTE — Patient Instructions (Signed)
Keep your urology appt in 2 weeks

## 2021-07-26 NOTE — Progress Notes (Signed)
Subjective: 75 year old male presents the office with concerns of a painful spot on the ball of his left foot.  He states the only area that really causes discomfort is from on the bottom of the foot, submetatarsal 3.  There is submetatarsal 1 lesions not as sore.  He states that he has stopped drinking and his gout has been doing much better and since then the calluses have also improved no swelling, redness or drainage or any signs of infection he reports.  No other concerns.   Objective: AAO x3, NAD DP/PT pulses palpable 2/4 bilaterally, CRT less than 3 seconds Significantly thickened hyperkeratotic lesion submetatarsal 1 as well as submetatarsal 3 of the left foot.  Although the submetatarsal 1 callus is much thicker not, and the pain.  I did debride this some small of bleeding occurred but there is no ongoing ulceration.  There is no drainage or pus otherwise.  Debrided submetatarsal 3 hyperkeratotic lesion without any underlying ulceration or signs of infection as well. No open lesions or pre-ulcerative lesions.  No pain with calf compression, swelling, warmth, erythema  Assessment: Thick hyperkeratotic lesions left foot  Plan: -All treatment options discussed with the patient including all alternatives, risks, complications. -Sharply debrided lesions x2.  Mild bleeding submetatarsal 1 area skin with alcohol and antibiotic ointment was applied followed in a bandage.  Watch for signs or signs of infection.   -Daily foot inspection -Moisturizer daily.  Trula Slade DPM

## 2021-08-08 ENCOUNTER — Other Ambulatory Visit: Payer: Self-pay | Admitting: Family Medicine

## 2021-08-08 ENCOUNTER — Telehealth: Payer: Self-pay

## 2021-08-08 DIAGNOSIS — R609 Edema, unspecified: Secondary | ICD-10-CM

## 2021-08-08 MED ORDER — FUROSEMIDE 20 MG PO TABS: 20.0000 mg | ORAL_TABLET | Freq: Every day | ORAL | 1 refills | Status: DC

## 2021-08-08 NOTE — Telephone Encounter (Signed)
DWP Lasix has been called in. No further questions.

## 2021-08-08 NOTE — Progress Notes (Signed)
Patient called with complaints of edema.  We will try him on Lasix 20 mg daily as needed

## 2021-08-08 NOTE — Telephone Encounter (Signed)
Foley cath is out and doing fine.  Feet and ankles have been swelling regularly, he has been taking his medications, elevating and trying to stay off of them. Nothing has helped. To Dr. Sabra Heck  [phone]

## 2021-08-15 ENCOUNTER — Telehealth: Payer: Self-pay | Admitting: *Deleted

## 2021-08-15 NOTE — Telephone Encounter (Signed)
Patient called and stated that the medication you gave him for swelling in the ankles are working very well.  Stated that the Cath has been removed and he is moving urine.   Just wanted to Thank you for good senior Care.   FYI

## 2021-08-29 ENCOUNTER — Telehealth: Payer: Self-pay

## 2021-08-29 NOTE — Telephone Encounter (Signed)
Incoming call received form patient stating Dr.Miller recommended a flu vaccine in October and patient would like to know how to go about getting this.  Patient was informed he can call same day and schedule a nurse visit for a high dose flu vaccine or he can receive at a local pharmacy.  Patient states he will go to a local pharmacy

## 2021-08-31 ENCOUNTER — Other Ambulatory Visit: Payer: Self-pay | Admitting: Family Medicine

## 2021-08-31 DIAGNOSIS — R609 Edema, unspecified: Secondary | ICD-10-CM

## 2021-09-05 ENCOUNTER — Other Ambulatory Visit: Payer: Self-pay

## 2021-09-05 ENCOUNTER — Ambulatory Visit (INDEPENDENT_AMBULATORY_CARE_PROVIDER_SITE_OTHER): Payer: Federal, State, Local not specified - PPO | Admitting: *Deleted

## 2021-09-05 DIAGNOSIS — Z23 Encounter for immunization: Secondary | ICD-10-CM | POA: Diagnosis not present

## 2021-10-25 ENCOUNTER — Ambulatory Visit (INDEPENDENT_AMBULATORY_CARE_PROVIDER_SITE_OTHER): Payer: Federal, State, Local not specified - PPO | Admitting: Family Medicine

## 2021-10-25 ENCOUNTER — Encounter: Payer: Self-pay | Admitting: Family Medicine

## 2021-10-25 ENCOUNTER — Other Ambulatory Visit: Payer: Self-pay

## 2021-10-25 VITALS — BP 128/82 | HR 117 | Temp 97.8°F | Ht 74.0 in | Wt 215.4 lb

## 2021-10-25 DIAGNOSIS — I10 Essential (primary) hypertension: Secondary | ICD-10-CM

## 2021-10-25 DIAGNOSIS — M109 Gout, unspecified: Secondary | ICD-10-CM

## 2021-10-25 DIAGNOSIS — R399 Unspecified symptoms and signs involving the genitourinary system: Secondary | ICD-10-CM

## 2021-10-25 NOTE — Progress Notes (Signed)
Provider:  Alain Honey, MD  Careteam: Patient Care Team: Wardell Honour, MD as PCP - General (Family Medicine)  PLACE OF SERVICE:  [place] Directive information    No Known Allergies  Chief Complaint  Patient presents with   Medical Management of Chronic Issues    Patient presents today for a 3 month follow-up.   Quality Metric Gaps    Hep C screening, colonoscopy, zoster, pneumonia.     HPI: Patient is a 75 y.o. male .  Patient is here today for a 70-month follow-up.  He is doing really well by history.  We had seen him for urinary retention.  He had made several ER visits with Foley cath insertion.  Now he has no problems with obstruction or retention.  He is getting up several times at night to urinate which interferes with his sleep.  He has lost weight.  He has had no flareups of gout since he stopped drinking alcohol and generally he seems very pleased with his current life.  Review of Systems:  Review of Systems  Constitutional: Negative.   Respiratory: Negative.    Cardiovascular: Negative.   Genitourinary:  Positive for frequency.  Neurological: Negative.   Psychiatric/Behavioral: Negative.    All other systems reviewed and are negative.  Past Medical History:  Diagnosis Date   Arthritis    Asthma    Colon polyps    Gout    Hay fever    Hyperlipidemia    Hypertension    Positive TB test    Urinary retention with incomplete bladder emptying    Past Surgical History:  Procedure Laterality Date   COLONOSCOPY     HERNIA REPAIR  1998   TOOTH EXTRACTION     WRIST SURGERY  1964   Social History:   reports that he has been smoking cigars. He has never used smokeless tobacco. He reports that he does not currently use alcohol. He reports current drug use. Drug: Marijuana.  Family History  Problem Relation Age of Onset   Arthritis Mother    Hyperlipidemia Mother    Heart disease Mother    Hypertension Mother    Uterine cancer Mother     Arthritis Father    Hyperlipidemia Father    Heart disease Father    Hypertension Father    Tuberculosis Father    Hyperlipidemia Sister    Hypertension Sister    Heart disease Sister    Arthritis Maternal Grandmother    Arthritis Maternal Grandfather    Arthritis Paternal Grandmother    Arthritis Paternal Grandfather    Prostate cancer Neg Hx     Medications: Patient's Medications  New Prescriptions   No medications on file  Previous Medications   ACETAMINOPHEN (TYLENOL) 500 MG TABLET    Take 500 mg by mouth every 6 (six) hours as needed for mild pain.   ALLOPURINOL (ZYLOPRIM) 300 MG TABLET    Take 1 tablet (300 mg total) by mouth daily.   AMLODIPINE (NORVASC) 10 MG TABLET    Take 10 mg by mouth daily.   FINASTERIDE (PROSCAR) 5 MG TABLET    Take 5 mg by mouth daily.   FLUCONAZOLE (DIFLUCAN) 150 MG TABLET    Take 150 mg by mouth once.   FUROSEMIDE (LASIX) 20 MG TABLET    TAKE 1 TABLET BY MOUTH EVERY DAY   MINOCYCLINE (MINOCIN) 100 MG CAPSULE    Take 1 capsule (100 mg total) by mouth daily as needed (  urinary problems).   MULTIPLE VITAMIN (MULTIVITAMIN) TABLET    Take 1 tablet by mouth every other day.   POTASSIUM CHLORIDE 20 MEQ/15ML (10%) SOLN    Take 20 mEq by mouth every other day.   ROSUVASTATIN (CRESTOR) 10 MG TABLET    Take 1 tablet (10 mg total) by mouth daily.   TAMSULOSIN (FLOMAX) 0.4 MG CAPS CAPSULE    Take 0.4 mg by mouth daily after breakfast.   VALSARTAN-HYDROCHLOROTHIAZIDE (DIOVAN-HCT) 320-25 MG TABLET    Take 1 tablet by mouth daily.  Modified Medications   No medications on file  Discontinued Medications   No medications on file    Physical Exam:  Vitals:   10/25/21 0824  BP: 128/82  Pulse: (!) 117  Temp: 97.8 F (36.6 C)  SpO2: 98%  Weight: 215 lb 6.4 oz (97.7 kg)  Height: 6\' 2"  (1.88 m)   Body mass index is 27.66 kg/m. Wt Readings from Last 3 Encounters:  10/25/21 215 lb 6.4 oz (97.7 kg)  07/25/21 212 lb 6.4 oz (96.3 kg)  06/13/21 212 lb 3.2  oz (96.3 kg)    Physical Exam Vitals and nursing note reviewed.  Constitutional:      Appearance: Normal appearance.  Cardiovascular:     Rate and Rhythm: Normal rate and regular rhythm.  Pulmonary:     Effort: Pulmonary effort is normal.     Breath sounds: Normal breath sounds.  Abdominal:     General: Abdomen is flat.     Palpations: Abdomen is soft.  Neurological:     General: No focal deficit present.     Mental Status: He is alert and oriented to person, place, and time.    Labs reviewed: Basic Metabolic Panel: Recent Labs    06/08/21 0934 07/18/21 0850 07/25/21 0902  NA 140 139 140  K 3.0* 3.0* 3.3*  CL 106 103 104  CO2 21* 22 27  GLUCOSE 153* 138* 94  BUN 21 15 11   CREATININE 1.21 0.97 0.78  CALCIUM 10.6* 10.6* 10.1   Liver Function Tests: Recent Labs    06/08/21 0934 07/18/21 0850 07/25/21 0902  AST 25 29 11   ALT 20 23 13   ALKPHOS 78 64  --   BILITOT 1.3* 1.3* 0.7  PROT 8.7* 8.6* 7.2  ALBUMIN 5.0 4.9  --    Recent Labs    06/08/21 0934  LIPASE 35   No results for input(s): AMMONIA in the last 8760 hours. CBC: Recent Labs    04/27/21 1107 06/08/21 0934 07/18/21 0850  WBC 16.7* 13.0* 12.9*  NEUTROABS 15.1* 11.1* 11.3*  HGB 17.7* 15.9 15.5  HCT 51.3 45.6 44.0  MCV 91.9 91.9 90.7  PLT 296 299 309   Lipid Panel: Recent Labs    07/25/21 0902  CHOL 165  HDL 44  LDLCALC 102*  TRIG 94  CHOLHDL 3.8   TSH: No results for input(s): TSH in the last 8760 hours. A1C: No results found for: HGBA1C   Assessment/Plan  1. 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 Lower urinary tract symptoms (LUTS) Voiding without problems.  I did increase his Flomax from 0.4 mg 2.8 mg to see if I can improve on his quality of sleep since he has nocturia frequently.  3. Primary hypertension Blood pressure is good on combination for furosemide amlodipine and  valsartan   Alain Honey, MD [place] [phone]

## 2021-12-19 ENCOUNTER — Other Ambulatory Visit: Payer: Self-pay | Admitting: Family

## 2022-01-09 ENCOUNTER — Other Ambulatory Visit: Payer: Self-pay | Admitting: *Deleted

## 2022-01-09 MED ORDER — POTASSIUM CHLORIDE 20 MEQ/15ML (10%) PO SOLN: 20.0000 meq | ORAL | 1 refills | Status: DC

## 2022-01-09 NOTE — Telephone Encounter (Signed)
Patient called requesting refill

## 2022-02-19 ENCOUNTER — Other Ambulatory Visit: Payer: Self-pay | Admitting: Family

## 2022-02-26 ENCOUNTER — Other Ambulatory Visit: Payer: Self-pay | Admitting: Family

## 2022-03-13 ENCOUNTER — Other Ambulatory Visit: Payer: Self-pay | Admitting: *Deleted

## 2022-03-13 MED ORDER — MINOCYCLINE HCL 100 MG PO CAPS: 100.0000 mg | ORAL_CAPSULE | Freq: Every day | ORAL | 1 refills | Status: DC | PRN

## 2022-03-13 NOTE — Telephone Encounter (Signed)
Patient requested refill.  ?Pended Rx and sent to Dr. Sabra Heck for approval.  ?

## 2022-03-19 ENCOUNTER — Telehealth: Payer: Self-pay | Admitting: *Deleted

## 2022-03-19 DIAGNOSIS — J45909 Unspecified asthma, uncomplicated: Secondary | ICD-10-CM

## 2022-03-19 NOTE — Telephone Encounter (Signed)
Patient called requesting a refill on Albuterol Inhaler. Stated that he has not had to have it refilled in a long time.  ? ?Medication is NOT in patient's current medication list.  ?Pended Rx and sent to Dr. Sabra Heck for approval.  ?

## 2022-03-20 MED ORDER — ALBUTEROL SULFATE HFA 108 (90 BASE) MCG/ACT IN AERS: 1.0000 | INHALATION_SPRAY | Freq: Four times a day (QID) | RESPIRATORY_TRACT | 5 refills | Status: AC | PRN

## 2022-04-05 ENCOUNTER — Other Ambulatory Visit: Payer: Self-pay | Admitting: Family Medicine

## 2022-05-01 ENCOUNTER — Encounter: Payer: Self-pay | Admitting: Family Medicine

## 2022-05-01 ENCOUNTER — Ambulatory Visit: Payer: Federal, State, Local not specified - PPO | Admitting: Family Medicine

## 2022-05-01 VITALS — BP 158/100 | HR 79 | Temp 98.4°F | Ht 74.0 in | Wt 236.2 lb

## 2022-05-01 DIAGNOSIS — E785 Hyperlipidemia, unspecified: Secondary | ICD-10-CM | POA: Diagnosis not present

## 2022-05-01 DIAGNOSIS — R399 Unspecified symptoms and signs involving the genitourinary system: Secondary | ICD-10-CM

## 2022-05-01 DIAGNOSIS — M109 Gout, unspecified: Secondary | ICD-10-CM

## 2022-05-01 DIAGNOSIS — I10 Essential (primary) hypertension: Secondary | ICD-10-CM | POA: Diagnosis not present

## 2022-05-01 NOTE — Progress Notes (Signed)
Provider:  Alain Honey, MD  Careteam: Patient Care Team: Wardell Honour, MD as PCP - General (Family Medicine)  PLACE OF SERVICE:  [place] Directive information    No Known Allergies  Chief Complaint  Patient presents with   Medical Management of Chronic Issues    Patient presents today for a 6 month follow-up.   Quality Metric Gaps    Zoster, pneumonia, Hep C screening      HPI: Patient is a 76 y.o. male no complaints today but routine follow-up of chronic problems including hypertension hyperlipidemia prostatic hypertrophy with nocturia, and gout.  Only symptom patient has is nocturia.  He stopped Flomax because it was giving him side effects. We discussed his medications in detail.  He does take Lasix as well as hydrochlorothiazide.  There is been no swelling edema shortness of breath and I suggested he take the Lasix as needed based on weights edema or shortness of breath. He has had no flareups of gout since he stopped using alcohol Best news though is no further episodes of urinary retention  Review of Systems:  Review of Systems  HENT: Negative.    Eyes: Negative.   Respiratory: Negative.    Cardiovascular: Negative.  Negative for leg swelling.  Gastrointestinal: Negative.   Genitourinary:  Positive for frequency.  Musculoskeletal: Negative.   Skin: Negative.   Neurological: Negative.   Psychiatric/Behavioral: Negative.    All other systems reviewed and are negative.  Past Medical History:  Diagnosis Date   Arthritis    Asthma    Colon polyps    Gout    Hay fever    Hyperlipidemia    Hypertension    Positive TB test    Urinary retention with incomplete bladder emptying    Past Surgical History:  Procedure Laterality Date   COLONOSCOPY     HERNIA REPAIR  1998   TOOTH EXTRACTION     WRIST SURGERY  1964   Social History:   reports that he has been smoking cigars. He has never used smokeless tobacco. He reports that he does not  currently use alcohol. He reports current drug use. Drug: Marijuana.  Family History  Problem Relation Age of Onset   Arthritis Mother    Hyperlipidemia Mother    Heart disease Mother    Hypertension Mother    Uterine cancer Mother    Arthritis Father    Hyperlipidemia Father    Heart disease Father    Hypertension Father    Tuberculosis Father    Hyperlipidemia Sister    Hypertension Sister    Heart disease Sister    Arthritis Maternal Grandmother    Arthritis Maternal Grandfather    Arthritis Paternal Grandmother    Arthritis Paternal Grandfather    Prostate cancer Neg Hx     Medications: Patient's Medications  New Prescriptions   No medications on file  Previous Medications   ACETAMINOPHEN (TYLENOL) 500 MG TABLET    Take 500 mg by mouth every 6 (six) hours as needed for mild pain.   ALBUTEROL (VENTOLIN HFA) 108 (90 BASE) MCG/ACT INHALER    Inhale 1 puff into the lungs every 6 (six) hours as needed for wheezing or shortness of breath.   ALLOPURINOL (ZYLOPRIM) 300 MG TABLET    TAKE 1 TABLET BY MOUTH EVERY DAY   AMLODIPINE (NORVASC) 10 MG TABLET    Take 10 mg by mouth daily.   FINASTERIDE (PROSCAR) 5 MG TABLET    Take  5 mg by mouth daily.   FUROSEMIDE (LASIX) 20 MG TABLET    TAKE 1 TABLET BY MOUTH EVERY DAY   MINOCYCLINE (MINOCIN) 100 MG CAPSULE    Take 1 capsule (100 mg total) by mouth daily as needed (urinary problems).   MULTIPLE VITAMIN (MULTIVITAMIN) TABLET    Take 1 tablet by mouth every other day.   POTASSIUM CHLORIDE 20 MEQ/15ML (10%) SOLN    TAKE 15 MLS (20 MEQ TOTAL) BY MOUTH EVERY OTHER DAY.   ROSUVASTATIN (CRESTOR) 10 MG TABLET    TAKE 1 TABLET BY MOUTH EVERY DAY   TAMSULOSIN (FLOMAX) 0.4 MG CAPS CAPSULE    Take 0.4 mg by mouth once a week.   VALSARTAN-HYDROCHLOROTHIAZIDE (DIOVAN-HCT) 320-25 MG TABLET    TAKE 1 TABLET BY MOUTH EVERY DAY  Modified Medications   No medications on file  Discontinued Medications   FLUCONAZOLE (DIFLUCAN) 150 MG TABLET    Take 150 mg  by mouth once.    Physical Exam:  Vitals:   05/01/22 0826  BP: (!) 158/100  Pulse: 79  Temp: 98.4 F (36.9 C)  SpO2: 97%  Weight: 236 lb 3.2 oz (107.1 kg)  Height: '6\' 2"'$  (1.88 m)   Body mass index is 30.33 kg/m. Wt Readings from Last 3 Encounters:  05/01/22 236 lb 3.2 oz (107.1 kg)  10/25/21 215 lb 6.4 oz (97.7 kg)  07/25/21 212 lb 6.4 oz (96.3 kg)    Physical Exam Vitals and nursing note reviewed.  Constitutional:      Appearance: Normal appearance.  Cardiovascular:     Rate and Rhythm: Normal rate and regular rhythm.  Pulmonary:     Effort: Pulmonary effort is normal.     Breath sounds: Normal breath sounds.  Neurological:     General: No focal deficit present.     Mental Status: He is alert and oriented to person, place, and time.  Psychiatric:        Mood and Affect: Mood normal.        Behavior: Behavior normal.    Labs reviewed: Basic Metabolic Panel: Recent Labs    06/08/21 0934 07/18/21 0850 07/25/21 0902  NA 140 139 140  K 3.0* 3.0* 3.3*  CL 106 103 104  CO2 21* 22 27  GLUCOSE 153* 138* 94  BUN '21 15 11  '$ CREATININE 1.21 0.97 0.78  CALCIUM 10.6* 10.6* 10.1   Liver Function Tests: Recent Labs    06/08/21 0934 07/18/21 0850 07/25/21 0902  AST '25 29 11  '$ ALT '20 23 13  '$ ALKPHOS 78 64  --   BILITOT 1.3* 1.3* 0.7  PROT 8.7* 8.6* 7.2  ALBUMIN 5.0 4.9  --    Recent Labs    06/08/21 0934  LIPASE 35   No results for input(s): AMMONIA in the last 8760 hours. CBC: Recent Labs    06/08/21 0934 07/18/21 0850  WBC 13.0* 12.9*  NEUTROABS 11.1* 11.3*  HGB 15.9 15.5  HCT 45.6 44.0  MCV 91.9 90.7  PLT 299 309   Lipid Panel: Recent Labs    07/25/21 0902  CHOL 165  HDL 44  LDLCALC 102*  TRIG 94  CHOLHDL 3.8   TSH: No results for input(s): TSH in the last 8760 hours. A1C: No results found for: HGBA1C   Assessment/Plan  1. Gout of foot, unspecified cause, unspecified chronicity, unspecified laterality Continues on allopurinol.   No recent flares.  Continue abstinence from alcohol  2. Hyperlipidemia, unspecified hyperlipidemia type Plan to check lipids at  next visit in 6 months.  Last check was 1 year ago in August and LDL was 102  3. Primary hypertension Blood pressure is elevated today but has been normal at home per history.  Suggested since he is on 2 medications take valsartan with hydrochlorothiazide in the morning and the amlodipine at bedtime for more even coverage of hypertension  4. Lower urinary tract symptoms (LUTS) Asymptomatic at this time we will reduce diuretic hoping to decrease urine frequency although it is multifactorial as his he has prostatic hypertrophy as well   Alain Honey, MD [facility] [phone]

## 2022-05-21 ENCOUNTER — Telehealth: Payer: Self-pay | Admitting: *Deleted

## 2022-05-21 DIAGNOSIS — R609 Edema, unspecified: Secondary | ICD-10-CM

## 2022-05-21 NOTE — Telephone Encounter (Signed)
Patient called and stated that he is currently taking Furosemide '20mg'$ .  Stated that his swelling won't go all the way down in his feet.   Patient is requesting for the Furosemide to be increased.  Please Advise.

## 2022-05-22 MED ORDER — FUROSEMIDE 20 MG PO TABS: 20.0000 mg | ORAL_TABLET | Freq: Every day | ORAL | 1 refills | Status: DC

## 2022-05-22 NOTE — Telephone Encounter (Signed)
Edward Honour, MD  You 53 minutes ago (11:01 AM)   [place]; double up on furosemide. Take 2 tab('20mg'$ ) at once       Patient notified and agreed.

## 2022-05-22 NOTE — Telephone Encounter (Signed)
Wardell Honour, MD  You 1 hour ago (8:23 AM)   Not sure what he means but dose lasix should be enough        Patient stated that he wants his Furosemide Increased or Take extra to get the Swelling to go down in his feet. Stated that the Furosemide '20mg'$  is not working to get the swelling down. Stated that it is better but still has swelling.  Please Advise.

## 2022-07-10 ENCOUNTER — Telehealth: Payer: Self-pay

## 2022-07-10 NOTE — Telephone Encounter (Signed)
Patient called about prescription from Dr. Tresa Moore with [facility] but called Dr. Johny Shears office.  RN called patient to verify information and then referred him to the correct doctor with number to reach office.  Nothing more at this time was appreciative for the help.

## 2022-08-10 ENCOUNTER — Other Ambulatory Visit (HOSPITAL_COMMUNITY): Payer: Self-pay

## 2022-08-10 MED ORDER — SILDENAFIL CITRATE 20 MG PO TABS
ORAL_TABLET | ORAL | 3 refills | Status: DC
  Filled 2022-08-10: qty 30, 15d supply, fill #0
  Filled 2022-09-18: qty 30, 15d supply, fill #1
  Filled 2022-11-07: qty 30, 15d supply, fill #2

## 2022-08-28 MED ORDER — FUROSEMIDE 20 MG PO TABS: 40.0000 mg | ORAL_TABLET | Freq: Every day | ORAL | 1 refills | Status: DC

## 2022-08-28 NOTE — Addendum Note (Signed)
Addended by: Rafael Bihari A on: 08/28/2022 01:11 PM   Modules accepted: Orders

## 2022-08-28 NOTE — Telephone Encounter (Signed)
Patient called and stated that this is working well and needs a refill.

## 2022-09-12 ENCOUNTER — Ambulatory Visit (INDEPENDENT_AMBULATORY_CARE_PROVIDER_SITE_OTHER): Payer: Federal, State, Local not specified - PPO | Admitting: *Deleted

## 2022-09-12 DIAGNOSIS — Z23 Encounter for immunization: Secondary | ICD-10-CM

## 2022-09-18 ENCOUNTER — Other Ambulatory Visit: Payer: Self-pay | Admitting: Family Medicine

## 2022-09-18 ENCOUNTER — Other Ambulatory Visit (HOSPITAL_COMMUNITY): Payer: Self-pay

## 2022-10-08 ENCOUNTER — Other Ambulatory Visit: Payer: Self-pay

## 2022-10-08 MED ORDER — ROSUVASTATIN CALCIUM 10 MG PO TABS: 10.0000 mg | ORAL_TABLET | Freq: Every day | ORAL | 1 refills | Status: DC

## 2022-10-16 ENCOUNTER — Other Ambulatory Visit (HOSPITAL_COMMUNITY): Payer: Self-pay

## 2022-10-24 ENCOUNTER — Other Ambulatory Visit: Payer: Self-pay | Admitting: Family Medicine

## 2022-10-26 ENCOUNTER — Other Ambulatory Visit (HOSPITAL_COMMUNITY): Payer: Self-pay

## 2022-11-06 ENCOUNTER — Ambulatory Visit: Payer: Federal, State, Local not specified - PPO | Admitting: Family Medicine

## 2022-11-07 ENCOUNTER — Other Ambulatory Visit (HOSPITAL_COMMUNITY): Payer: Self-pay

## 2022-11-07 ENCOUNTER — Ambulatory Visit: Payer: Federal, State, Local not specified - PPO | Admitting: Family Medicine

## 2022-11-07 MED ORDER — SILDENAFIL CITRATE 100 MG PO TABS
100.0000 mg | ORAL_TABLET | Freq: Every day | ORAL | 1 refills | Status: DC | PRN
  Filled 2022-11-07: qty 30, 30d supply, fill #0
  Filled 2023-01-06: qty 30, 30d supply, fill #1

## 2022-11-08 ENCOUNTER — Encounter: Payer: Self-pay | Admitting: Family Medicine

## 2022-11-08 ENCOUNTER — Ambulatory Visit: Payer: Federal, State, Local not specified - PPO | Admitting: Family Medicine

## 2022-11-08 VITALS — BP 160/100 | HR 94 | Temp 97.6°F | Ht 74.0 in | Wt 236.8 lb

## 2022-11-08 DIAGNOSIS — M109 Gout, unspecified: Secondary | ICD-10-CM

## 2022-11-08 DIAGNOSIS — E785 Hyperlipidemia, unspecified: Secondary | ICD-10-CM

## 2022-11-08 DIAGNOSIS — I10 Essential (primary) hypertension: Secondary | ICD-10-CM | POA: Diagnosis not present

## 2022-11-08 NOTE — Patient Instructions (Signed)
Try to remember and take one of your blood pressure pills, amlodipine at nighttime Try a cock up splint that you can purchase at the pharmacy on your right wrist wearing it most of the time to see if that would help your symptoms of carpal tunnel

## 2022-11-08 NOTE — Progress Notes (Signed)
Provider:  Alain Honey, MD  Careteam: Patient Care Team: Wardell Honour, MD as PCP - General (Family Medicine)  PLACE OF SERVICE:  [place] Directive information    No Known Allergies  No chief complaint on file.    HPI: Patient is a 76 y.o. male who returns today for a 6-monthfollow-up medical management of chronic problems including BPH, hyperlipidemia, gout, erectile dysfunction.  Today he is also complaining of some numbness in his fingers but the thumb and index and long finger on each hand.  The symptoms are not any worse at night.  There is no associated pain.  He has seen urologist recently and they are pleased with his function of his bladder and kidneys.  He is on finasteride as well as times also some.  Been no further episodes of retention which she had when he first presented here. He does monitor his blood pressure and generally is about 130/80 even though today here in the office it is elevated.  He is on amlodipine as well as valsartan and hydrochlorothiazide.  I had asked him previously to take amlodipine at night to combat this elevation blood pressure in the morning potentially  Review of Systems:  Review of Systems  Constitutional: Negative.   Respiratory: Negative.    Cardiovascular: Negative.   Genitourinary:  Positive for frequency.  Neurological: Negative.   Psychiatric/Behavioral: Negative.    All other systems reviewed and are negative.   Past Medical History:  Diagnosis Date   Arthritis    Asthma    Colon polyps    Gout    Hay fever    Hyperlipidemia    Hypertension    Positive TB test    Urinary retention with incomplete bladder emptying    Past Surgical History:  Procedure Laterality Date   COLONOSCOPY     HERNIA REPAIR  1998   TOOTH EXTRACTION     WRIST SURGERY  1964   Social History:   reports that he has been smoking cigars. He has never used smokeless tobacco. He reports that he does not currently use  alcohol. He reports current drug use. Drug: Marijuana.  Family History  Problem Relation Age of Onset   Arthritis Mother    Hyperlipidemia Mother    Heart disease Mother    Hypertension Mother    Uterine cancer Mother    Arthritis Father    Hyperlipidemia Father    Heart disease Father    Hypertension Father    Tuberculosis Father    Hyperlipidemia Sister    Hypertension Sister    Heart disease Sister    Arthritis Maternal Grandmother    Arthritis Maternal Grandfather    Arthritis Paternal Grandmother    Arthritis Paternal Grandfather    Prostate cancer Neg Hx     Medications: Patient's Medications  New Prescriptions   No medications on file  Previous Medications   ACETAMINOPHEN (TYLENOL) 500 MG TABLET    Take 500 mg by mouth every 6 (six) hours as needed for mild pain.   ALBUTEROL (VENTOLIN HFA) 108 (90 BASE) MCG/ACT INHALER    Inhale 1 puff into the lungs every 6 (six) hours as needed for wheezing or shortness of breath.   ALLOPURINOL (ZYLOPRIM) 300 MG TABLET    TAKE 1 TABLET BY MOUTH EVERY DAY   AMLODIPINE (NORVASC) 10 MG TABLET    Take 10 mg by mouth daily.   FINASTERIDE (PROSCAR) 5 MG TABLET    Take 5  mg by mouth daily.   FUROSEMIDE (LASIX) 20 MG TABLET    Take 2 tablets (40 mg total) by mouth daily.   MINOCYCLINE (MINOCIN) 100 MG CAPSULE    TAKE 1 CAPSULE (100 MG TOTAL) BY MOUTH DAILY AS NEEDED (URINARY PROBLEMS).   MULTIPLE VITAMIN (MULTIVITAMIN) TABLET    Take 1 tablet by mouth every other day.   POTASSIUM CHLORIDE 20 MEQ/15ML (10%) SOLN    TAKE 15 MLS (20 MEQ TOTAL) BY MOUTH EVERY OTHER DAY.   ROSUVASTATIN (CRESTOR) 10 MG TABLET    Take 1 tablet (10 mg total) by mouth daily.   SILDENAFIL (REVATIO) 20 MG TABLET    Take 2 tablets by mouth every other day as needed. May take 2-5 tablets 1 hour prior on an empty stomach. DO NOT take with nitrates.   SILDENAFIL (VIAGRA) 100 MG TABLET    Take 1 tablet (100 mg total) by mouth daily as needed.   TAMSULOSIN (FLOMAX) 0.4 MG  CAPS CAPSULE    Take 0.4 mg by mouth once a week.   VALSARTAN-HYDROCHLOROTHIAZIDE (DIOVAN-HCT) 320-25 MG TABLET    TAKE 1 TABLET BY MOUTH EVERY DAY  Modified Medications   No medications on file  Discontinued Medications   No medications on file    Physical Exam:  There were no vitals filed for this visit. There is no height or weight on file to calculate BMI. Wt Readings from Last 3 Encounters:  05/01/22 236 lb 3.2 oz (107.1 kg)  10/25/21 215 lb 6.4 oz (97.7 kg)  07/25/21 212 lb 6.4 oz (96.3 kg)    Physical Exam Vitals and nursing note reviewed.  Constitutional:      Appearance: Normal appearance.  Cardiovascular:     Rate and Rhythm: Normal rate and regular rhythm.  Pulmonary:     Effort: Pulmonary effort is normal.     Breath sounds: Normal breath sounds.  Neurological:     General: No focal deficit present.     Mental Status: He is alert and oriented to person, place, and time.     Comments: Checking for possible carpal tunnel syndrome, he has positive Tinel's test on both hands but Phalen and Finkelstein's tests are negative.     Labs reviewed: Basic Metabolic Panel: No results for input(s): "NA", "K", "CL", "CO2", "GLUCOSE", "BUN", "CREATININE", "CALCIUM", "MG", "PHOS", "TSH" in the last 8760 hours. Liver Function Tests: No results for input(s): "AST", "ALT", "ALKPHOS", "BILITOT", "PROT", "ALBUMIN" in the last 8760 hours. No results for input(s): "LIPASE", "AMYLASE" in the last 8760 hours. No results for input(s): "AMMONIA" in the last 8760 hours. CBC: No results for input(s): "WBC", "NEUTROABS", "HGB", "HCT", "MCV", "PLT" in the last 8760 hours. Lipid Panel: No results for input(s): "CHOL", "HDL", "LDLCALC", "TRIG", "CHOLHDL", "LDLDIRECT" in the last 8760 hours. TSH: No results for input(s): "TSH" in the last 8760 hours. A1C: No results found for: "HGBA1C"   Assessment/Plan  1. Hyperlipidemia, unspecified hyperlipidemia type Patient takes rosuvastatin 10  mg.  LDL 1 year ago was 102.  Tolerating medicine well  2. Primary hypertension Blood pressure is elevated here in the office today but generally at home when he checks it pressures have been good.  I did ask him to take one of his blood pressure pills in the morning and the other 1 at night  3. Gout of foot, unspecified cause, unspecified chronicity, unspecified laterality No recent flares of gout.  Continue with allopurinol 300 mg a day   Alain Honey, MD [facility] [phone]

## 2022-11-09 LAB — COMPLETE METABOLIC PANEL WITH GFR
AG Ratio: 1.7 (calc) (ref 1.0–2.5)
ALT: 16 U/L (ref 9–46)
AST: 15 U/L (ref 10–35)
Albumin: 4.6 g/dL (ref 3.6–5.1)
Alkaline phosphatase (APISO): 83 U/L (ref 35–144)
BUN: 10 mg/dL (ref 7–25)
CO2: 28 mmol/L (ref 20–32)
Calcium: 9.8 mg/dL (ref 8.6–10.3)
Chloride: 103 mmol/L (ref 98–110)
Creat: 0.85 mg/dL (ref 0.70–1.28)
Globulin: 2.7 g/dL (calc) (ref 1.9–3.7)
Glucose, Bld: 88 mg/dL (ref 65–99)
Potassium: 3.2 mmol/L — ABNORMAL LOW (ref 3.5–5.3)
Sodium: 141 mmol/L (ref 135–146)
Total Bilirubin: 1 mg/dL (ref 0.2–1.2)
Total Protein: 7.3 g/dL (ref 6.1–8.1)
eGFR: 91 mL/min/{1.73_m2} (ref >=60)

## 2022-11-09 LAB — LIPID PANEL
Cholesterol: 169 mg/dL (ref <200)
HDL: 42 mg/dL (ref >=40)
LDL Cholesterol (Calc): 99 mg/dL (calc)
Non-HDL Cholesterol (Calc): 127 mg/dL (calc) (ref <130)
Total CHOL/HDL Ratio: 4 (calc) (ref <5.0)
Triglycerides: 182 mg/dL — ABNORMAL HIGH (ref <150)

## 2022-12-07 ENCOUNTER — Emergency Department (HOSPITAL_COMMUNITY): Payer: Medicare Other

## 2022-12-07 ENCOUNTER — Inpatient Hospital Stay (HOSPITAL_COMMUNITY): Payer: Medicare Other

## 2022-12-07 ENCOUNTER — Inpatient Hospital Stay (HOSPITAL_COMMUNITY)
Admission: EM | Admit: 2022-12-07 | Discharge: 2022-12-09 | DRG: 092 | Disposition: A | Discharge status: Home / Self Care | Payer: Medicare Other | Attending: Internal Medicine | Admitting: Internal Medicine

## 2022-12-07 DIAGNOSIS — R200 Anesthesia of skin: Secondary | ICD-10-CM | POA: Diagnosis present

## 2022-12-07 DIAGNOSIS — I509 Heart failure, unspecified: Secondary | ICD-10-CM | POA: Diagnosis present

## 2022-12-07 DIAGNOSIS — R338 Other retention of urine: Secondary | ICD-10-CM | POA: Diagnosis present

## 2022-12-07 DIAGNOSIS — Z8261 Family history of arthritis: Secondary | ICD-10-CM | POA: Diagnosis not present

## 2022-12-07 DIAGNOSIS — F17290 Nicotine dependence, other tobacco product, uncomplicated: Secondary | ICD-10-CM | POA: Diagnosis present

## 2022-12-07 DIAGNOSIS — R399 Unspecified symptoms and signs involving the genitourinary system: Secondary | ICD-10-CM | POA: Diagnosis present

## 2022-12-07 DIAGNOSIS — E785 Hyperlipidemia, unspecified: Secondary | ICD-10-CM | POA: Diagnosis present

## 2022-12-07 DIAGNOSIS — Z8249 Family history of ischemic heart disease and other diseases of the circulatory system: Secondary | ICD-10-CM

## 2022-12-07 DIAGNOSIS — N138 Other obstructive and reflux uropathy: Secondary | ICD-10-CM | POA: Diagnosis present

## 2022-12-07 DIAGNOSIS — T50915A Adverse effect of multiple unspecified drugs, medicaments and biological substances, initial encounter: Secondary | ICD-10-CM | POA: Diagnosis present

## 2022-12-07 DIAGNOSIS — R32 Unspecified urinary incontinence: Secondary | ICD-10-CM | POA: Diagnosis present

## 2022-12-07 DIAGNOSIS — Z83438 Family history of other disorder of lipoprotein metabolism and other lipidemia: Secondary | ICD-10-CM

## 2022-12-07 DIAGNOSIS — Z79899 Other long term (current) drug therapy: Secondary | ICD-10-CM

## 2022-12-07 DIAGNOSIS — Z8719 Personal history of other diseases of the digestive system: Secondary | ICD-10-CM

## 2022-12-07 DIAGNOSIS — N401 Enlarged prostate with lower urinary tract symptoms: Secondary | ICD-10-CM | POA: Diagnosis present

## 2022-12-07 DIAGNOSIS — G928 Other toxic encephalopathy: Secondary | ICD-10-CM | POA: Diagnosis present

## 2022-12-07 DIAGNOSIS — M109 Gout, unspecified: Secondary | ICD-10-CM | POA: Diagnosis present

## 2022-12-07 DIAGNOSIS — Z831 Family history of other infectious and parasitic diseases: Secondary | ICD-10-CM | POA: Diagnosis not present

## 2022-12-07 DIAGNOSIS — E876 Hypokalemia: Secondary | ICD-10-CM | POA: Diagnosis present

## 2022-12-07 DIAGNOSIS — F1210 Cannabis abuse, uncomplicated: Secondary | ICD-10-CM | POA: Diagnosis present

## 2022-12-07 DIAGNOSIS — R4701 Aphasia: Secondary | ICD-10-CM | POA: Diagnosis present

## 2022-12-07 DIAGNOSIS — Z781 Physical restraint status: Secondary | ICD-10-CM | POA: Diagnosis not present

## 2022-12-07 DIAGNOSIS — R569 Unspecified convulsions: Secondary | ICD-10-CM | POA: Diagnosis not present

## 2022-12-07 DIAGNOSIS — Z8049 Family history of malignant neoplasm of other genital organs: Secondary | ICD-10-CM | POA: Diagnosis not present

## 2022-12-07 DIAGNOSIS — I10 Essential (primary) hypertension: Secondary | ICD-10-CM | POA: Diagnosis present

## 2022-12-07 DIAGNOSIS — R4182 Altered mental status, unspecified: Principal | ICD-10-CM

## 2022-12-07 DIAGNOSIS — J45909 Unspecified asthma, uncomplicated: Secondary | ICD-10-CM | POA: Diagnosis present

## 2022-12-07 DIAGNOSIS — I110 Hypertensive heart disease with heart failure: Secondary | ICD-10-CM | POA: Diagnosis present

## 2022-12-07 LAB — URINALYSIS, ROUTINE W REFLEX MICROSCOPIC
Bilirubin Urine: NEGATIVE
Glucose, UA: NEGATIVE mg/dL
Ketones, ur: NEGATIVE mg/dL
Nitrite: NEGATIVE
Protein, ur: 100 mg/dL — AB
Specific Gravity, Urine: 1.018 (ref 1.005–1.030)
pH: 8 (ref 5.0–8.0)

## 2022-12-07 LAB — COMPREHENSIVE METABOLIC PANEL
ALT: 18 U/L (ref 0–44)
AST: 28 U/L (ref 15–41)
Albumin: 4.1 g/dL (ref 3.5–5.0)
Alkaline Phosphatase: 69 U/L (ref 38–126)
Anion gap: 14 (ref 5–15)
BUN: 7 mg/dL — ABNORMAL LOW (ref 8–23)
CO2: 22 mmol/L (ref 22–32)
Calcium: 9.2 mg/dL (ref 8.9–10.3)
Chloride: 106 mmol/L (ref 98–111)
Creatinine, Ser: 1.18 mg/dL (ref 0.61–1.24)
GFR, Estimated: >60 mL/min (ref >60)
Glucose, Bld: 123 mg/dL — ABNORMAL HIGH (ref 70–99)
Potassium: 3.1 mmol/L — ABNORMAL LOW (ref 3.5–5.1)
Sodium: 142 mmol/L (ref 135–145)
Total Bilirubin: 1 mg/dL (ref 0.3–1.2)
Total Protein: 7.1 g/dL (ref 6.5–8.1)

## 2022-12-07 LAB — DIFFERENTIAL
Abs Immature Granulocytes: 0.05 10*3/uL (ref 0.00–0.07)
Basophils Absolute: 0 10*3/uL (ref 0.0–0.1)
Basophils Relative: 0 %
Eosinophils Absolute: 0.5 10*3/uL (ref 0.0–0.5)
Eosinophils Relative: 5 %
Immature Granulocytes: 1 %
Lymphocytes Relative: 24 %
Lymphs Abs: 2.4 10*3/uL (ref 0.7–4.0)
Monocytes Absolute: 0.8 10*3/uL (ref 0.1–1.0)
Monocytes Relative: 8 %
Neutro Abs: 6.4 10*3/uL (ref 1.7–7.7)
Neutrophils Relative %: 62 %

## 2022-12-07 LAB — I-STAT CHEM 8, ED
BUN: 9 mg/dL (ref 8–23)
Calcium, Ion: 1.11 mmol/L — ABNORMAL LOW (ref 1.15–1.40)
Chloride: 103 mmol/L (ref 98–111)
Creatinine, Ser: 0.9 mg/dL (ref 0.61–1.24)
Glucose, Bld: 116 mg/dL — ABNORMAL HIGH (ref 70–99)
HCT: 42 % (ref 39.0–52.0)
Hemoglobin: 14.3 g/dL (ref 13.0–17.0)
Potassium: 3.7 mmol/L (ref 3.5–5.1)
Sodium: 141 mmol/L (ref 135–145)
TCO2: 25 mmol/L (ref 22–32)

## 2022-12-07 LAB — CBG MONITORING, ED: Glucose-Capillary: 112 mg/dL — ABNORMAL HIGH (ref 70–99)

## 2022-12-07 LAB — CBC
HCT: 42 % (ref 39.0–52.0)
Hemoglobin: 14.4 g/dL (ref 13.0–17.0)
MCH: 31.1 pg (ref 26.0–34.0)
MCHC: 34.3 g/dL (ref 30.0–36.0)
MCV: 90.7 fL (ref 80.0–100.0)
Platelets: 277 10*3/uL (ref 150–400)
RBC: 4.63 MIL/uL (ref 4.22–5.81)
RDW: 14.3 % (ref 11.5–15.5)
WBC: 10.1 10*3/uL (ref 4.0–10.5)
nRBC: 0 % (ref 0.0–0.2)

## 2022-12-07 LAB — RAPID URINE DRUG SCREEN, HOSP PERFORMED
Amphetamines: NOT DETECTED
Barbiturates: NOT DETECTED
Benzodiazepines: POSITIVE — AB
Cocaine: NOT DETECTED
Opiates: NOT DETECTED
Tetrahydrocannabinol: POSITIVE — AB

## 2022-12-07 LAB — ETHANOL: Alcohol, Ethyl (B): <10 mg/dL (ref <10)

## 2022-12-07 LAB — MAGNESIUM: Magnesium: 2 mg/dL (ref 1.7–2.4)

## 2022-12-07 LAB — PROTIME-INR
INR: 1 (ref 0.8–1.2)
Prothrombin Time: 12.9 seconds (ref 11.4–15.2)

## 2022-12-07 LAB — APTT: aPTT: 28 seconds (ref 24–36)

## 2022-12-07 MED ORDER — SODIUM CHLORIDE 0.9 % IV SOLN
3000.0000 mg | Freq: Once | INTRAVENOUS | Status: AC
  Administered 2022-12-07: 3000 mg via INTRAVENOUS
  Filled 2022-12-07: qty 30

## 2022-12-07 MED ORDER — LORAZEPAM 2 MG/ML IJ SOLN
1.0000 mg | Freq: Once | INTRAMUSCULAR | Status: AC
  Administered 2022-12-07: 1 mg via INTRAVENOUS
  Filled 2022-12-07: qty 1

## 2022-12-07 MED ORDER — HALOPERIDOL LACTATE 5 MG/ML IJ SOLN
5.0000 mg | Freq: Four times a day (QID) | INTRAMUSCULAR | Status: DC | PRN
  Administered 2022-12-07: 5 mg via INTRAVENOUS
  Filled 2022-12-07: qty 1

## 2022-12-07 MED ORDER — POTASSIUM CHLORIDE 10 MEQ/100ML IV SOLN
10.0000 meq | INTRAVENOUS | Status: AC
  Administered 2022-12-07 – 2022-12-08 (×3): 10 meq via INTRAVENOUS
  Filled 2022-12-07 (×3): qty 100

## 2022-12-07 MED ORDER — SODIUM CHLORIDE 0.9 % IV SOLN: 2000.0000 mg | Freq: Once | INTRAVENOUS | Status: DC

## 2022-12-07 MED ORDER — HEPARIN SODIUM (PORCINE) 5000 UNIT/ML IJ SOLN
5000.0000 [IU] | Freq: Three times a day (TID) | INTRAMUSCULAR | Status: DC
  Administered 2022-12-07 – 2022-12-09 (×7): 5000 [IU] via SUBCUTANEOUS
  Filled 2022-12-07 (×7): qty 1

## 2022-12-07 MED ORDER — ONDANSETRON HCL 4 MG/2ML IJ SOLN: 4.0000 mg | Freq: Four times a day (QID) | INTRAMUSCULAR | Status: DC | PRN

## 2022-12-07 MED ORDER — ONDANSETRON HCL 4 MG PO TABS: 4.0000 mg | ORAL_TABLET | Freq: Four times a day (QID) | ORAL | Status: DC | PRN

## 2022-12-07 MED ORDER — HYDRALAZINE HCL 20 MG/ML IJ SOLN
5.0000 mg | INTRAMUSCULAR | Status: AC
  Administered 2022-12-07: 5 mg via INTRAVENOUS
  Filled 2022-12-07: qty 1

## 2022-12-07 MED ORDER — HYDRALAZINE HCL 20 MG/ML IJ SOLN
5.0000 mg | Freq: Four times a day (QID) | INTRAMUSCULAR | Status: DC | PRN
  Administered 2022-12-08: 5 mg via INTRAVENOUS
  Filled 2022-12-07: qty 1

## 2022-12-07 MED ORDER — POTASSIUM CHLORIDE 10 MEQ/100ML IV SOLN
10.0000 meq | INTRAVENOUS | Status: AC
  Administered 2022-12-07: 10 meq via INTRAVENOUS
  Filled 2022-12-07: qty 100

## 2022-12-07 MED ORDER — ACETAMINOPHEN 325 MG PO TABS
650.0000 mg | ORAL_TABLET | Freq: Four times a day (QID) | ORAL | Status: DC | PRN
  Administered 2022-12-09: 650 mg via ORAL
  Filled 2022-12-07: qty 2

## 2022-12-07 MED ORDER — ACETAMINOPHEN 650 MG RE SUPP: 650.0000 mg | Freq: Four times a day (QID) | RECTAL | Status: DC | PRN

## 2022-12-07 MED ORDER — METOPROLOL TARTRATE 5 MG/5ML IV SOLN
5.0000 mg | Freq: Four times a day (QID) | INTRAVENOUS | Status: DC | PRN
  Administered 2022-12-07 – 2022-12-09 (×3): 5 mg via INTRAVENOUS
  Filled 2022-12-07 (×3): qty 5

## 2022-12-07 MED ORDER — IOHEXOL 350 MG/ML SOLN
75.0000 mL | Freq: Once | INTRAVENOUS | Status: AC | PRN
  Administered 2022-12-07: 75 mL via INTRAVENOUS

## 2022-12-07 NOTE — Procedures (Signed)
Patient Name: Edward Wolf  MRN: [id number]  Epilepsy Attending: Lora Havens  Referring Physician/Provider: Janine Ores, NP  Date: 12/07/2022 Duration: 21.46 mins  Patient history: 76yo M with ams. EEG to evaluate for seizure  Level of alertness: Awake  AEDs during EEG study: versed  Technical aspects: This EEG study was done with scalp electrodes positioned according to the 10-20 International system of electrode placement. Electrical activity was reviewed with band pass filter of 1-'70Hz'$ , sensitivity of 7 uV/mm, display speed of 77m/sec with a '60Hz'$  notched filter applied as appropriate. EEG data were recorded continuously and digitally stored.  Video monitoring was available and reviewed as appropriate.  Description: No clear posterior dominant rhythm was seen. There is an excessive amount of 15 to 18 Hz beta activity distributed symmetrically and diffusely. Hyperventilation and photic stimulation were not performed.     ABNORMALITY - Excessive beta, generalized  IMPRESSION: This study is within normal limits. The excessive beta activity seen in the background is most likely due to the effect of benzodiazepine and is a benign EEG pattern. No seizures or epileptiform discharges were seen throughout the recording.  A normal interictal EEG does not exclude the diagnosis of epilepsy.  Edward Wolf

## 2022-12-07 NOTE — ED Provider Notes (Signed)
[place] EMERGENCY DEPARTMENT Provider Note   CSN: [id number] Arrival date & time: 12/07/22  1211  An emergency department physician performed an initial assessment on this suspected stroke patient at 6.  History  Chief Complaint  Patient presents with   Code Stroke    Edward Wolf is a 76 y.o. male.  Pt notes to be altered, agitated this AM, arrive by EMS as code stroke activation.  Pt limited historian - level 5 caveat. Family reports last seeing normal this AM around 10 AM, but then family member states his attitude/demeanor may have been a bit off for him then. Pt/family deny any recent specific physical c/o. No headaches. No chest pain. No abd pain. Has been eating and drinking per normal. No recent change in meds. No fever/chills.   The history is provided by the patient, the spouse, a relative, medical records and the EMS personnel.       Home Medications Prior to Admission medications   Medication Sig Start Date End Date Taking? Authorizing Provider  acetaminophen (TYLENOL) 500 MG tablet Take 500 mg by mouth every 6 (six) hours as needed for mild pain.    [provider]  albuterol (VENTOLIN HFA) 108 (90 Base) MCG/ACT inhaler Inhale 1 puff into the lungs every 6 (six) hours as needed for wheezing or shortness of breath. 03/20/22   Wardell Honour, MD  allopurinol (ZYLOPRIM) 300 MG tablet TAKE 1 TABLET BY MOUTH EVERY DAY 09/19/22   Wardell Honour, MD  amLODipine (NORVASC) 10 MG tablet Take 10 mg by mouth daily.    [provider]  finasteride (PROSCAR) 5 MG tablet Take 5 mg by mouth daily. 07/10/21   [provider]  furosemide (LASIX) 20 MG tablet Take 2 tablets (40 mg total) by mouth daily. 08/28/22   Wardell Honour, MD  minocycline (MINOCIN) 100 MG capsule TAKE 1 CAPSULE (100 MG TOTAL) BY MOUTH DAILY AS NEEDED (URINARY PROBLEMS). 10/25/22   Wardell Honour, MD  Multiple Vitamin (MULTIVITAMIN) tablet Take 1 tablet by  mouth every other day.    [provider]  potassium chloride 20 MEQ/15ML (10%) SOLN TAKE 15 MLS (20 MEQ TOTAL) BY MOUTH EVERY OTHER DAY. 04/06/22   Wardell Honour, MD  rosuvastatin (CRESTOR) 10 MG tablet Take 1 tablet (10 mg total) by mouth daily. 10/08/22   Wardell Honour, MD  sildenafil (VIAGRA) 100 MG tablet Take 1 tablet (100 mg total) by mouth daily as needed. 11/07/22     tamsulosin (FLOMAX) 0.4 MG CAPS capsule Take 0.4 mg by mouth once a week.    [provider]  valsartan-hydrochlorothiazide (DIOVAN-HCT) 320-25 MG tablet TAKE 1 TABLET BY MOUTH EVERY DAY 12/19/21   Wardell Honour, MD      Allergies    Patient has no known allergies.    Review of Systems   Review of Systems  Constitutional:  Negative for fever.  HENT:  Negative for sore throat.   Eyes:  Negative for pain and visual disturbance.  Respiratory:  Negative for cough and shortness of breath.   Cardiovascular:  Negative for chest pain.  Gastrointestinal:  Negative for abdominal pain, diarrhea and vomiting.  Genitourinary:  Negative for dysuria and flank pain.  Musculoskeletal:  Negative for back pain and neck pain.  Skin:  Negative for rash.  Neurological:  Negative for weakness, numbness and headaches.  Hematological:  Does not bruise/bleed easily.  Psychiatric/Behavioral:  Positive for confusion.     Physical  Exam Updated Vital Signs BP (!) 154/101   Pulse (!) 111   Resp (!) 24   Wt 108.6 kg   SpO2 100%   BMI 30.74 kg/m  Physical Exam Vitals and nursing note reviewed.  Constitutional:      Appearance: Normal appearance. He is well-developed.  HENT:     Head: Atraumatic.     Nose: Nose normal.     Mouth/Throat:     Mouth: Mucous membranes are moist.     Pharynx: Oropharynx is clear.  Eyes:     General: No scleral icterus.    Conjunctiva/sclera: Conjunctivae normal.     Pupils: Pupils are equal, round, and reactive to light.  Neck:     Vascular: No carotid bruit.      Trachea: No tracheal deviation.     Comments: No stiffness or rigidity.  Cardiovascular:     Rate and Rhythm: Normal rate and regular rhythm.     Pulses: Normal pulses.     Heart sounds: Normal heart sounds. No murmur heard.    No friction rub. No gallop.  Pulmonary:     Effort: Pulmonary effort is normal. No accessory muscle usage or respiratory distress.     Breath sounds: Normal breath sounds.  Abdominal:     General: Bowel sounds are normal. There is no distension.     Palpations: Abdomen is soft.     Tenderness: There is no abdominal tenderness.  Genitourinary:    Comments: No cva tenderness. Musculoskeletal:        General: No swelling or tenderness.     Cervical back: Normal range of motion and neck supple. No rigidity.     Right lower leg: No edema.     Left lower leg: No edema.     Comments: CTLS spine, non tender, aligned, no step off. Good rom bil extremities without pain or focal bony tenderness.   Skin:    General: Skin is warm and dry.     Findings: No rash. Erythema: .[name]. Neurological:     Mental Status: He is alert.     Comments: Alert, speech clear. Motor/sens grossly intact bil.   Psychiatric:     Comments: Mildly agitated/uncooperative.      ED Results / Procedures / Treatments   Labs (all labs ordered are listed, but only abnormal results are displayed) Results for orders placed or performed during the hospital encounter of 12/07/22  Ethanol  Result Value Ref Range   Alcohol, Ethyl (B) <10 <10 mg/dL  Protime-INR  Result Value Ref Range   Prothrombin Time 12.9 11.4 - 15.2 seconds   INR 1.0 0.8 - 1.2  APTT  Result Value Ref Range   aPTT 28 24 - 36 seconds  CBC  Result Value Ref Range   WBC 10.1 4.0 - 10.5 K/uL   RBC 4.63 4.22 - 5.81 MIL/uL   Hemoglobin 14.4 13.0 - 17.0 g/dL   HCT 42.0 39.0 - 52.0 %   MCV 90.7 80.0 - 100.0 fL   MCH 31.1 26.0 - 34.0 pg   MCHC 34.3 30.0 - 36.0 g/dL   RDW 14.3 11.5 - 15.5 %   Platelets 277 150 - 400 K/uL    nRBC 0.0 0.0 - 0.2 %  Differential  Result Value Ref Range   Neutrophils Relative % 62 %   Neutro Abs 6.4 1.7 - 7.7 K/uL   Lymphocytes Relative 24 %   Lymphs Abs 2.4 0.7 - 4.0 K/uL   Monocytes Relative 8 %  Monocytes Absolute 0.8 0.1 - 1.0 K/uL   Eosinophils Relative 5 %   Eosinophils Absolute 0.5 0.0 - 0.5 K/uL   Basophils Relative 0 %   Basophils Absolute 0.0 0.0 - 0.1 K/uL   Immature Granulocytes 1 %   Abs Immature Granulocytes 0.05 0.00 - 0.07 K/uL  Comprehensive metabolic panel  Result Value Ref Range   Sodium 142 135 - 145 mmol/L   Potassium 3.1 (L) 3.5 - 5.1 mmol/L   Chloride 106 98 - 111 mmol/L   CO2 22 22 - 32 mmol/L   Glucose, Bld 123 (H) 70 - 99 mg/dL   BUN 7 (L) 8 - 23 mg/dL   Creatinine, Ser 1.18 0.61 - 1.24 mg/dL   Calcium 9.2 8.9 - 10.3 mg/dL   Total Protein 7.1 6.5 - 8.1 g/dL   Albumin 4.1 3.5 - 5.0 g/dL   AST 28 15 - 41 U/L   ALT 18 0 - 44 U/L   Alkaline Phosphatase 69 38 - 126 U/L   Total Bilirubin 1.0 0.3 - 1.2 mg/dL   GFR, Estimated >60 >60 mL/min   Anion gap 14 5 - 15  I-stat chem 8, ED  Result Value Ref Range   Sodium 141 135 - 145 mmol/L   Potassium 3.7 3.5 - 5.1 mmol/L   Chloride 103 98 - 111 mmol/L   BUN 9 8 - 23 mg/dL   Creatinine, Ser 0.90 0.61 - 1.24 mg/dL   Glucose, Bld 116 (H) 70 - 99 mg/dL   Calcium, Ion 1.11 (L) 1.15 - 1.40 mmol/L   TCO2 25 22 - 32 mmol/L   Hemoglobin 14.3 13.0 - 17.0 g/dL   HCT 42.0 39.0 - 52.0 %  CBG monitoring, ED  Result Value Ref Range   Glucose-Capillary 112 (H) 70 - 99 mg/dL   MR BRAIN WO CONTRAST  Result Date: 12/07/2022 CLINICAL DATA:  Altered mental status EXAM: MRI HEAD WITHOUT CONTRAST TECHNIQUE: Multiplanar, multiecho pulse sequences of the brain and surrounding structures were obtained without intravenous contrast. COMPARISON:  Same-day CT/CTA head and neck FINDINGS: Brain: There is no acute intracranial hemorrhage, extra-axial fluid collection, or acute infarct. Parenchymal volume is within expected  limits for age. The ventricles are normal in size. Gray-white differentiation is preserved. There is patchy and confluent FLAIR signal abnormality throughout the supratentorial white matter likely reflecting sequela of moderate chronic small-vessel ischemic change. There is a small remote infarct in the midline dorsal pons. There is no mass lesion.  There is no mass effect or midline shift. Vascular: Normal flow voids. Skull and upper cervical spine: Unremarkable. Sinuses/Orbits: There is a small retention cyst in the right maxillary sinus. The globes and orbits are unremarkable. Other: None. IMPRESSION: 1. No acute intracranial pathology. 2. Moderate background chronic small-vessel ischemic change. Electronically Signed   By: Valetta Mole M.D.   On: 12/07/2022 13:34   CT ANGIO HEAD NECK W WO CM (CODE STROKE)  Result Date: 12/07/2022 CLINICAL DATA:  Neuro deficit, acute, stroke suspected. EXAM: CT ANGIOGRAPHY HEAD AND NECK TECHNIQUE: Multidetector CT imaging of the head and neck was performed using the standard protocol during bolus administration of intravenous contrast. Multiplanar CT image reconstructions and MIPs were obtained to evaluate the vascular anatomy. Carotid stenosis measurements (when applicable) are obtained utilizing NASCET criteria, using the distal internal carotid diameter as the denominator. RADIATION DOSE REDUCTION: This exam was performed according to the departmental dose-optimization program which includes automated exposure control, adjustment of the mA and/or kV according to patient  size and/or use of iterative reconstruction technique. CONTRAST:  38m OMNIPAQUE IOHEXOL 350 MG/ML SOLN COMPARISON:  None Available. FINDINGS: CTA NECK FINDINGS Aortic arch: Normal variant aortic arch branching pattern with common origin of the brachiocephalic and left common carotid arteries. Wide patency of the brachiocephalic and subclavian arteries. Right carotid system: Patent without evidence of  stenosis, dissection, or significant atherosclerosis. Left carotid system: Patent with minimal atherosclerotic plaque in the distal common carotid artery. No evidence of stenosis or dissection. Vertebral arteries: Patent and codominant without evidence of stenosis, dissection, or significant atherosclerosis. Skeleton: Advanced cervical disc degeneration. Other neck: No evidence of cervical lymphadenopathy or mass. Upper chest: No consolidation or mass in the included lung apices. Review of the MIP images confirms the above findings CTA HEAD FINDINGS Anterior circulation: The internal carotid arteries are widely patent from skull base to carotid termini with minimal nonstenotic atherosclerotic plaque bilaterally. ACAs and MCAs are patent without evidence of a proximal branch occlusion or significant proximal stenosis. Branch vessel atherosclerosis is more notable in the distal left MCA branch vessels compared to the right. No aneurysm is identified. Posterior circulation: The intracranial vertebral arteries are widely patent to the basilar. The basilar artery is widely patent. Patent SCA is are seen bilaterally. There is a small left posterior communicating artery. Both PCAs are patent without evidence of a significant proximal stenosis. No aneurysm is identified. Venous sinuses: Patent. Anatomic variants: None. Review of the MIP images confirms the above findings These results were communicated to Dr. ARory Percyat 12:35 pm on 12/07/2022 by text page via the [facility] system. IMPRESSION: No large vessel occlusion or significant proximal stenosis in the head or neck. Electronically Signed   By: ALogan BoresM.D.   On: 12/07/2022 12:46   CT HEAD CODE STROKE WO CONTRAST  Result Date: 12/07/2022 CLINICAL DATA:  Code stroke. Neuro deficit, acute, stroke suspected. EXAM: CT HEAD WITHOUT CONTRAST TECHNIQUE: Contiguous axial images were obtained from the base of the skull through the vertex without intravenous  contrast. RADIATION DOSE REDUCTION: This exam was performed according to the departmental dose-optimization program which includes automated exposure control, adjustment of the mA and/or kV according to patient size and/or use of iterative reconstruction technique. COMPARISON:  None Available. FINDINGS: Brain: Within limitations of mild motion artifact, there is no evidence of an acute cortically based infarct, intracranial hemorrhage, mass, midline shift, or extra-axial fluid collection. Patchy to confluent hypodensities in the cerebral white matter bilaterally are nonspecific but compatible with moderate chronic small vessel ischemic disease. There is mild cerebral atrophy. Vascular: No hyperdense vessel. Skull: No fracture or suspicious osseous lesion. Sinuses/Orbits: Small mucous retention cyst in the right maxillary sinus. Clear mastoid air cells. Unremarkable orbits. Other: None. ASPECTS ([place] Score) - Ganglionic level infarction (caudate, lentiform nuclei, internal capsule, insula, M1-M3 cortex): 7 - Supraganglionic infarction (M4-M6 cortex): 3 Total score (0-10 with 10 being normal): 10 These results were communicated to Dr. ARory Percyat 12:35 pm on 12/07/2022 by text page via the [facility] system. IMPRESSION: 1. No evidence of acute intracranial abnormality. ASPECTS of 10. 2. Moderate chronic small vessel ischemic disease. Electronically Signed   By: ALogan BoresM.D.   On: 12/07/2022 12:35     EKG EKG Interpretation  Date/Time:  Friday December 07 2022 13:35:16 EST Ventricular Rate:  110 PR Interval:  181 QRS Duration: 95 QT Interval:  344 QTC Calculation: 466 R Axis:   56 Text Interpretation: Sinus tachycardia Confirmed by SLajean Saver(  [id number]) on 12/07/2022 1:42:03 PM  Radiology MR BRAIN WO CONTRAST  Result Date: 12/07/2022 CLINICAL DATA:  Altered mental status EXAM: MRI HEAD WITHOUT CONTRAST TECHNIQUE: Multiplanar, multiecho pulse sequences of the brain  and surrounding structures were obtained without intravenous contrast. COMPARISON:  Same-day CT/CTA head and neck FINDINGS: Brain: There is no acute intracranial hemorrhage, extra-axial fluid collection, or acute infarct. Parenchymal volume is within expected limits for age. The ventricles are normal in size. Gray-white differentiation is preserved. There is patchy and confluent FLAIR signal abnormality throughout the supratentorial white matter likely reflecting sequela of moderate chronic small-vessel ischemic change. There is a small remote infarct in the midline dorsal pons. There is no mass lesion.  There is no mass effect or midline shift. Vascular: Normal flow voids. Skull and upper cervical spine: Unremarkable. Sinuses/Orbits: There is a small retention cyst in the right maxillary sinus. The globes and orbits are unremarkable. Other: None. IMPRESSION: 1. No acute intracranial pathology. 2. Moderate background chronic small-vessel ischemic change. Electronically Signed   By: Valetta Mole M.D.   On: 12/07/2022 13:34   CT ANGIO HEAD NECK W WO CM (CODE STROKE)  Result Date: 12/07/2022 CLINICAL DATA:  Neuro deficit, acute, stroke suspected. EXAM: CT ANGIOGRAPHY HEAD AND NECK TECHNIQUE: Multidetector CT imaging of the head and neck was performed using the standard protocol during bolus administration of intravenous contrast. Multiplanar CT image reconstructions and MIPs were obtained to evaluate the vascular anatomy. Carotid stenosis measurements (when applicable) are obtained utilizing NASCET criteria, using the distal internal carotid diameter as the denominator. RADIATION DOSE REDUCTION: This exam was performed according to the departmental dose-optimization program which includes automated exposure control, adjustment of the mA and/or kV according to patient size and/or use of iterative reconstruction technique. CONTRAST:  50m OMNIPAQUE IOHEXOL 350 MG/ML SOLN COMPARISON:  None Available. FINDINGS: CTA  NECK FINDINGS Aortic arch: Normal variant aortic arch branching pattern with common origin of the brachiocephalic and left common carotid arteries. Wide patency of the brachiocephalic and subclavian arteries. Right carotid system: Patent without evidence of stenosis, dissection, or significant atherosclerosis. Left carotid system: Patent with minimal atherosclerotic plaque in the distal common carotid artery. No evidence of stenosis or dissection. Vertebral arteries: Patent and codominant without evidence of stenosis, dissection, or significant atherosclerosis. Skeleton: Advanced cervical disc degeneration. Other neck: No evidence of cervical lymphadenopathy or mass. Upper chest: No consolidation or mass in the included lung apices. Review of the MIP images confirms the above findings CTA HEAD FINDINGS Anterior circulation: The internal carotid arteries are widely patent from skull base to carotid termini with minimal nonstenotic atherosclerotic plaque bilaterally. ACAs and MCAs are patent without evidence of a proximal branch occlusion or significant proximal stenosis. Branch vessel atherosclerosis is more notable in the distal left MCA branch vessels compared to the right. No aneurysm is identified. Posterior circulation: The intracranial vertebral arteries are widely patent to the basilar. The basilar artery is widely patent. Patent SCA is are seen bilaterally. There is a small left posterior communicating artery. Both PCAs are patent without evidence of a significant proximal stenosis. No aneurysm is identified. Venous sinuses: Patent. Anatomic variants: None. Review of the MIP images confirms the above findings These results were communicated to Dr. ARory Percyat 12:35 pm on 12/07/2022 by text page via the [facility]aging system. IMPRESSION: No large vessel occlusion or significant proximal stenosis in the head or neck. Electronically Signed   By: ALogan BoresM.D.   On: 12/07/2022 12:46   CT HEAD  CODE STROKE WO  CONTRAST  Result Date: 12/07/2022 CLINICAL DATA:  Code stroke. Neuro deficit, acute, stroke suspected. EXAM: CT HEAD WITHOUT CONTRAST TECHNIQUE: Contiguous axial images were obtained from the base of the skull through the vertex without intravenous contrast. RADIATION DOSE REDUCTION: This exam was performed according to the departmental dose-optimization program which includes automated exposure control, adjustment of the mA and/or kV according to patient size and/or use of iterative reconstruction technique. COMPARISON:  None Available. FINDINGS: Brain: Within limitations of mild motion artifact, there is no evidence of an acute cortically based infarct, intracranial hemorrhage, mass, midline shift, or extra-axial fluid collection. Patchy to confluent hypodensities in the cerebral white matter bilaterally are nonspecific but compatible with moderate chronic small vessel ischemic disease. There is mild cerebral atrophy. Vascular: No hyperdense vessel. Skull: No fracture or suspicious osseous lesion. Sinuses/Orbits: Small mucous retention cyst in the right maxillary sinus. Clear mastoid air cells. Unremarkable orbits. Other: None. ASPECTS ([place] Stroke Program Early CT Score) - Ganglionic level infarction (caudate, lentiform nuclei, internal capsule, insula, M1-M3 cortex): 7 - Supraganglionic infarction (M4-M6 cortex): 3 Total score (0-10 with 10 being normal): 10 These results were communicated to Dr. Rory Percy at 12:35 pm on 12/07/2022 by text page via the [facility] messaging system. IMPRESSION: 1. No evidence of acute intracranial abnormality. ASPECTS of 10. 2. Moderate chronic small vessel ischemic disease. Electronically Signed   By: Logan Bores M.D.   On: 12/07/2022 12:35    Procedures Procedures    Medications Ordered in ED Medications  levETIRAcetam (KEPPRA) 3,000 mg in sodium chloride 0.9 % 250 mL IVPB (3,000 mg Intravenous New Bag/Given 12/07/22 1430)  iohexol (OMNIPAQUE) 350 MG/ML injection 75 mL  (75 mLs Intravenous Contrast Given 12/07/22 1231)  LORazepam (ATIVAN) injection 1 mg (1 mg Intravenous Given 12/07/22 1359)    ED Course/ Medical Decision Making/ A&P                           Medical Decision Making Problems Addressed: Acute alteration in mental status: acute illness or injury with systemic symptoms that poses a threat to life or bodily functions Essential hypertension: chronic illness or injury with exacerbation, progression, or side effects of treatment that poses a threat to life or bodily functions Hypokalemia: acute illness or injury with systemic symptoms that poses a threat to life or bodily functions Post-ictal state [facility]): acute illness or injury with systemic symptoms that poses a threat to life or bodily functions Seizure-like activity ([place]): acute illness or injury with systemic symptoms that poses a threat to life or bodily functions  Amount and/or Complexity of Data Reviewed Independent Historian: EMS    Details: Ems/fam hx External Data Reviewed: notes. Labs: ordered. Decision-making details documented in ED Course. Radiology: ordered and independent interpretation performed. Decision-making details documented in ED Course. ECG/medicine tests: ordered and independent interpretation performed. Decision-making details documented in ED Course.  Risk Prescription drug management. Drug therapy requiring intensive monitoring for toxicity. Decision regarding hospitalization.   Iv ns. Continuous pulse ox and cardiac monitoring. Labs ordered/sent. Imaging ordered.   Reviewed nursing notes and prior charts for additional history. External reports reviewed. Additional history from:  Cardiac monitor: sinus rhythm, rate 102.  Labs reviewed/interpreted by me - wbc and hgb normal. Glucose normal.   Xrays reviewed/interpreted by me - no pna.   CT reviewed/interpreted by me - no hem.   Po fluids/food.   MRI reviewed/interpreted by me - neg acute.  Eeg read  as suggesting myoclonic activity/sz - neurology ordered antisz meds, and rec medicine admission.  Medicine consulted for admission.   CRITICAL CARE Re: acute alteration in mental status, suspected seizures/post-ictal state, iv seizure medication  therapy, emergent neurology eval/consultation and eeg.  Performed by: Mirna Mires Total critical care time: 40 minutes Critical care time was exclusive of separately billable procedures and treating other patients. Critical care was necessary to treat or prevent imminent or life-threatening deterioration. Critical care was time spent personally by me on the following activities: development of treatment plan with patient and/or surrogate as well as nursing, discussions with consultants, evaluation of patient's response to treatment, examination of patient, obtaining history from patient or surrogate, ordering and performing treatments and interventions, ordering and review of laboratory studies, ordering and review of radiographic studies, pulse oximetry and re-evaluation of patient's condition.          Final Clinical Impression(s) / ED Diagnoses Final diagnoses:  None    Rx / DC Orders ED Discharge Orders     None         Lajean Saver, MD 12/07/22 1441

## 2022-12-07 NOTE — Progress Notes (Signed)
EEG complete - results pending 

## 2022-12-07 NOTE — H&P (Signed)
History and Physical    Edward Wolf XHB:[id number] DOB: Apr 19, 1946 DOA: 12/07/2022  I have briefly reviewed the patient's prior medical records in [place]  PCP: Wardell Honour, MD  Patient coming from: home  Chief Complaint: AMS and aphasia  HPI: Edward Wolf is a 76 y.o. male with medical history significant of HTN, BPH and HLD.  Patient was brought in initially as a code stroke as family found him altered this AM around 10.  He was combative and incontinent when found.  Upon arrival to the ER, patient had to be given versed for combativeness.  MRI was negative in the ER.  EEG was done, patient loaded with keppra and patient was seen by neurology.  Per wife, recently patient has been c/o numbness in his hands.  He has longstanding issues emptying his bladder.  And wife notes a fall yesterday while she was away.    Currently the patient is more confused than normal.  Baseline is taking care of his own ADLs per wife.  His main complaint currently is not being able to urinate and being cold.  In the ER, he was loaded with keppra and hospitalist were asked to admit.    Review of Systems: As per HPI otherwise 10 point review of systems negative.   Past Medical History:  Diagnosis Date   Arthritis    Asthma    Colon polyps    Gout    Hay fever    Hyperlipidemia    Hypertension    Positive TB test    Urinary retention with incomplete bladder emptying     Past Surgical History:  Procedure Laterality Date   COLONOSCOPY     [place]     reports that he has been smoking cigars. He has never used smokeless tobacco. He reports that he does not currently use alcohol. He reports current drug use. Drug: Marijuana.  No Known Allergies  Family History  Problem Relation Age of Onset   Arthritis Mother    Hyperlipidemia Mother    Heart disease Mother    Hypertension Mother    Uterine cancer Mother    Arthritis Father     Hyperlipidemia Father    Heart disease Father    Hypertension Father    Tuberculosis Father    Hyperlipidemia Sister    Hypertension Sister    Heart disease Sister    Arthritis Maternal Grandmother    Arthritis Maternal Grandfather    Arthritis Paternal Grandmother    Arthritis Paternal Grandfather    Prostate cancer Neg Hx     Prior to Admission medications   Medication Sig Start Date End Date Taking? Authorizing Provider  acetaminophen (TYLENOL) 500 MG tablet Take 500 mg by mouth every 6 (six) hours as needed for mild pain.   Yes [provider]  albuterol (VENTOLIN HFA) 108 (90 Base) MCG/ACT inhaler Inhale 1 puff into the lungs every 6 (six) hours as needed for wheezing or shortness of breath. 03/20/22  Yes Wardell Honour, MD  allopurinol (ZYLOPRIM) 300 MG tablet TAKE 1 TABLET BY MOUTH EVERY DAY Patient taking differently: Take 300 mg by mouth daily. 09/19/22  Yes Wardell Honour, MD  amLODipine (NORVASC) 10 MG tablet Take 10 mg by mouth daily.   Yes [provider]  finasteride (PROSCAR) 5 MG tablet Take 5 mg by mouth daily. 07/10/21  Yes [provider]  furosemide (  LASIX) 20 MG tablet Take 2 tablets (40 mg total) by mouth daily. 08/28/22  Yes Wardell Honour, MD  minocycline (MINOCIN) 100 MG capsule TAKE 1 CAPSULE (100 MG TOTAL) BY MOUTH DAILY AS NEEDED (URINARY PROBLEMS). 10/25/22  Yes Wardell Honour, MD  Multiple Vitamin (MULTIVITAMIN) tablet Take 1 tablet by mouth every other day.   Yes [provider]  potassium chloride 20 MEQ/15ML (10%) SOLN TAKE 15 MLS (20 MEQ TOTAL) BY MOUTH EVERY OTHER DAY. 04/06/22  Yes Wardell Honour, MD  rosuvastatin (CRESTOR) 10 MG tablet Take 1 tablet (10 mg total) by mouth daily. 10/08/22  Yes Wardell Honour, MD  sildenafil (VIAGRA) 100 MG tablet Take 1 tablet (100 mg total) by mouth daily as needed. 11/07/22  Yes   valsartan-hydrochlorothiazide (DIOVAN-HCT) 320-25 MG tablet TAKE 1 TABLET BY MOUTH  EVERY DAY Patient taking differently: Take 1 tablet by mouth daily. 12/19/21   Wardell Honour, MD    Physical Exam: Vitals:   12/07/22 1200 12/07/22 1236 12/07/22 1238 12/07/22 1325  BP:  (!) 154/101    Pulse:    (!) 111  Resp:  (!) 28 (!) 26 (!) 24  SpO2:    100%  Weight: 108.6 kg         Constitutional: confused but currently cooperative ENMT: Mucous membranes are moist. + bite in tongue Neck: normal, supple, no masses, no thyromegaly Respiratory: clear to auscultation bilaterally, Cardiovascular: Regular rate and rhythm, Abdomen: no tenderness, no masses palpated. Bowel sounds positive. Bladder distended Musculoskeletal: no clubbing / cyanosis. Normal muscle tone. Feet cool to the touch but pulses intact Skin: no rashes, lesions, ulcers. No induration Neurologic: moves all 4 ext Psychiatric: Normal judgment and insight. Confused but conversant  Labs on Admission: I have personally reviewed following labs and imaging studies  CBC: Recent Labs  Lab 12/07/22 1221 12/07/22 1228  WBC  --  10.1  NEUTROABS  --  6.4  HGB 14.3 14.4  HCT 42.0 42.0  MCV  --  90.7  PLT  --  295   Basic Metabolic Panel: Recent Labs  Lab 12/07/22 1221 12/07/22 1228  NA 141 142  K 3.7 3.1*  CL 103 106  CO2  --  22  GLUCOSE 116* 123*  BUN 9 7*  CREATININE 0.90 1.18  CALCIUM  --  9.2   GFR: Estimated Creatinine Clearance: 71 mL/min (by C-G formula based on SCr of 1.18 mg/dL). Liver Function Tests: Recent Labs  Lab 12/07/22 1228  AST 28  ALT 18  ALKPHOS 69  BILITOT 1.0  PROT 7.1  ALBUMIN 4.1   No results for input(s): "LIPASE", "AMYLASE" in the last 168 hours. No results for input(s): "AMMONIA" in the last 168 hours. Coagulation Profile: Recent Labs  Lab 12/07/22 1228  INR 1.0   Cardiac Enzymes: No results for input(s): "CKTOTAL", "CKMB", "CKMBINDEX", "TROPONINI" in the last 168 hours. BNP (last 3 results) No results for input(s): "PROBNP" in the last 8760  hours. HbA1C: No results for input(s): "HGBA1C" in the last 72 hours. CBG: Recent Labs  Lab 12/07/22 1216  GLUCAP 112*   Lipid Profile: No results for input(s): "CHOL", "HDL", "LDLCALC", "TRIG", "CHOLHDL", "LDLDIRECT" in the last 72 hours. Thyroid Function Tests: No results for input(s): "TSH", "T4TOTAL", "FREET4", "T3FREE", "THYROIDAB" in the last 72 hours. Anemia Panel: No results for input(s): "VITAMINB12", "FOLATE", "FERRITIN", "TIBC", "IRON", "RETICCTPCT" in the last 72 hours. Urine analysis:    Component Value Date/Time   COLORURINE YELLOW 07/18/2021 0905  APPEARANCEUR CLEAR 07/18/2021 0905   APPEARANCEUR Clear 11/15/2015 1144   LABSPEC 1.010 07/18/2021 0905   PHURINE 6.0 07/18/2021 0905   GLUCOSEU NEGATIVE 07/18/2021 0905   HGBUR LARGE (A) 07/18/2021 0905   BILIRUBINUR NEGATIVE 07/18/2021 0905   BILIRUBINUR Negative 11/15/2015 [place] 07/18/2021 0905   PROTEINUR 30 (A) 07/18/2021 0905   NITRITE NEGATIVE 07/18/2021 0905   LEUKOCYTESUR NEGATIVE 07/18/2021 0905     Radiological Exams on Admission: EEG adult  Result Date: 12/07/2022 Lora Havens, MD     12/07/2022  3:07 PM Patient Name: Edward Wolf MRN: [id number] Epilepsy Attending: Lora Havens Referring Physician/Provider: Janine Ores, NP Date: 12/07/2022 Duration: 21.46 mins Patient history: 76yo M with ams. EEG to evaluate for seizure Level of alertness: Awake AEDs during EEG study: versed Technical aspects: This EEG study was done with scalp electrodes positioned according to the 10-20 International system of electrode placement. Electrical activity was reviewed with band pass filter of 1-'70Hz'$ , sensitivity of 7 uV/mm, display speed of 66m/sec with a '60Hz'$  notched filter applied as appropriate. EEG data were recorded continuously and digitally stored.  Video monitoring was available and reviewed as appropriate. Description: No clear posterior dominant rhythm was seen. There is an excessive  amount of 15 to 18 Hz beta activity distributed symmetrically and diffusely. Hyperventilation and photic stimulation were not performed.   ABNORMALITY - Excessive beta, generalized IMPRESSION: This study is within normal limits. The excessive beta activity seen in the background is most likely due to the effect of benzodiazepine and is a benign EEG pattern. No seizures or epileptiform discharges were seen throughout the recording. A normal interictal EEG does not exclude the diagnosis of epilepsy. PLora Havens  MR BRAIN WO CONTRAST  Result Date: 12/07/2022 CLINICAL DATA:  Altered mental status EXAM: MRI HEAD WITHOUT CONTRAST TECHNIQUE: Multiplanar, multiecho pulse sequences of the brain and surrounding structures were obtained without intravenous contrast. COMPARISON:  Same-day CT/CTA head and neck FINDINGS: Brain: There is no acute intracranial hemorrhage, extra-axial fluid collection, or acute infarct. Parenchymal volume is within expected limits for age. The ventricles are normal in size. Gray-white differentiation is preserved. There is patchy and confluent FLAIR signal abnormality throughout the supratentorial white matter likely reflecting sequela of moderate chronic small-vessel ischemic change. There is a small remote infarct in the midline dorsal pons. There is no mass lesion.  There is no mass effect or midline shift. Vascular: Normal flow voids. Skull and upper cervical spine: Unremarkable. Sinuses/Orbits: There is a small retention cyst in the right maxillary sinus. The globes and orbits are unremarkable. Other: None. IMPRESSION: 1. No acute intracranial pathology. 2. Moderate background chronic small-vessel ischemic change. Electronically Signed   By: PValetta MoleM.D.   On: 12/07/2022 13:34   CT ANGIO HEAD NECK W WO CM (CODE STROKE)  Result Date: 12/07/2022 CLINICAL DATA:  Neuro deficit, acute, stroke suspected. EXAM: CT ANGIOGRAPHY HEAD AND NECK TECHNIQUE: Multidetector CT imaging of  the head and neck was performed using the standard protocol during bolus administration of intravenous contrast. Multiplanar CT image reconstructions and MIPs were obtained to evaluate the vascular anatomy. Carotid stenosis measurements (when applicable) are obtained utilizing NASCET criteria, using the distal internal carotid diameter as the denominator. RADIATION DOSE REDUCTION: This exam was performed according to the departmental dose-optimization program which includes automated exposure control, adjustment of the mA and/or kV according to patient size and/or use of iterative reconstruction technique. CONTRAST:  732mOMNIPAQUE IOHEXOL 350 MG/ML SOLN  COMPARISON:  None Available. FINDINGS: CTA NECK FINDINGS Aortic arch: Normal variant aortic arch branching pattern with common origin of the brachiocephalic and left common carotid arteries. Wide patency of the brachiocephalic and subclavian arteries. Right carotid system: Patent without evidence of stenosis, dissection, or significant atherosclerosis. Left carotid system: Patent with minimal atherosclerotic plaque in the distal common carotid artery. No evidence of stenosis or dissection. Vertebral arteries: Patent and codominant without evidence of stenosis, dissection, or significant atherosclerosis. Skeleton: Advanced cervical disc degeneration. Other neck: No evidence of cervical lymphadenopathy or mass. Upper chest: No consolidation or mass in the included lung apices. Review of the MIP images confirms the above findings CTA HEAD FINDINGS Anterior circulation: The internal carotid arteries are widely patent from skull base to carotid termini with minimal nonstenotic atherosclerotic plaque bilaterally. ACAs and MCAs are patent without evidence of a proximal branch occlusion or significant proximal stenosis. Branch vessel atherosclerosis is more notable in the distal left MCA branch vessels compared to the right. No aneurysm is identified. Posterior circulation:  The intracranial vertebral arteries are widely patent to the basilar. The basilar artery is widely patent. Patent SCA is are seen bilaterally. There is a small left posterior communicating artery. Both PCAs are patent without evidence of a significant proximal stenosis. No aneurysm is identified. Venous sinuses: Patent. Anatomic variants: None. Review of the MIP images confirms the above findings These results were communicated to Dr. Rory Percy at 12:35 pm on 12/07/2022 by text page via the [facility] messaging system. IMPRESSION: No large vessel occlusion or significant proximal stenosis in the head or neck. Electronically Signed   By: Logan Bores M.D.   On: 12/07/2022 12:46   CT HEAD CODE STROKE WO CONTRAST  Result Date: 12/07/2022 CLINICAL DATA:  Code stroke. Neuro deficit, acute, stroke suspected. EXAM: CT HEAD WITHOUT CONTRAST TECHNIQUE: Contiguous axial images were obtained from the base of the skull through the vertex without intravenous contrast. RADIATION DOSE REDUCTION: This exam was performed according to the departmental dose-optimization program which includes automated exposure control, adjustment of the mA and/or kV according to patient size and/or use of iterative reconstruction technique. COMPARISON:  None Available. FINDINGS: Brain: Within limitations of mild motion artifact, there is no evidence of an acute cortically based infarct, intracranial hemorrhage, mass, midline shift, or extra-axial fluid collection. Patchy to confluent hypodensities in the cerebral white matter bilaterally are nonspecific but compatible with moderate chronic small vessel ischemic disease. There is mild cerebral atrophy. Vascular: No hyperdense vessel. Skull: No fracture or suspicious osseous lesion. Sinuses/Orbits: Small mucous retention cyst in the right maxillary sinus. Clear mastoid air cells. Unremarkable orbits. Other: None. ASPECTS ([place] Stroke Program Early CT Score) - Ganglionic level infarction (caudate,  lentiform nuclei, internal capsule, insula, M1-M3 cortex): 7 - Supraganglionic infarction (M4-M6 cortex): 3 Total score (0-10 with 10 being normal): 10 These results were communicated to Dr. Rory Percy at 12:35 pm on 12/07/2022 by text page via the [facility] messaging system. IMPRESSION: 1. No evidence of acute intracranial abnormality. ASPECTS of 10. 2. Moderate chronic small vessel ischemic disease. Electronically Signed   By: Logan Bores M.D.   On: 12/07/2022 12:35      Assessment/Plan Principal Problem:   Seizures ([place]) Active Problems:   Hypertension   Lower urinary tract symptoms (LUTS)   Hypokalemia   Acute metabolic encephalopathy -? Seizure- neurology consult, CVA ruled out with negative MRI, EEG done, keppra load, UDS pending (from prior admissions there is marijuana use disclosed), seizure precautions   -r/o urinary  infection with U/A  BPH  -follows with [facility]  -insert foley for now-- voiding trial prior to d/c  -urinalysis pending to r/o infection  HTN  -PRN until able to take PO safely  Hypokalemia  -repelte  -Mg pending    DVT prophylaxis: heparin  Code Status: full  Family Communication: wife/son at beside Disposition Plan: progressive Consults called: neurology    Admission status: [place] [facility]   How to contact the [facility] Attending or Consulting provider [place] or covering provider during after hours [place], for this patient?  Check the care team in [facility] and look for a) attending/consulting [facility] provider listed and b) the [facility] team listed Log into [email or web address] and use [place]'s universal password to access. If you do not have the password, please contact the hospital operator. Locate the [facility] provider you are looking for under [facility] and page to a number that you can be directly reached. If you still have difficulty reaching the provider, please page the [facility] (Director on Call) for the Hospitalists listed on amion  for assistance.  12/07/2022, 3:51 PM

## 2022-12-07 NOTE — ED Notes (Signed)
Unable to obtain vitals on pt as the pt keeps on getting out bed and would not stay still.

## 2022-12-07 NOTE — ED Notes (Signed)
Pt constantly trying to get out bed which is interfering nursing care. MD made aware.

## 2022-12-07 NOTE — ED Notes (Signed)
Pt pulled out lines , and attempting getting out bed.security at bedside. MD made aware.

## 2022-12-07 NOTE — ED Notes (Signed)
Pt's BP is over 220. PRN metoprolol given. MD made aware.

## 2022-12-07 NOTE — ED Notes (Signed)
Patient transported to MRI 

## 2022-12-07 NOTE — Consult Note (Signed)
Neurology Consultation  Reason for Consult: Code Stroke Referring Physician: Ashok Cordia  CC: altered mental status   History is obtained from:EMS  HPI: Edward Wolf is a 76 y.o. male with PMH of HTN, urinary retention, HLD who presented to the ED via EMS after being found unresponsive by his wife at 76. EMS was called and on their arrival he was extremely combative and was sedated with '10mg'$  of versed.  His wife tells me that she last saw him normal around 9 AM before she left and he was loading the dishwasher in the kitchen.  His wife tells me that he fell yesterday, but does not remember if he hit his head.  She did not witness his fall, she was at the grocery store and he told her about it when she got home.  She also tells me that over the last couple of years he has had choking spells and after these spells he does not remember them.  He has also been reporting numbness in his hands and feet, but more severe in his hands.    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   Past Medical History:  Diagnosis Date   Arthritis    Asthma    Colon polyps    Gout    Hay fever    Hyperlipidemia    Hypertension    Positive TB test    Urinary retention with incomplete bladder emptying     Family History  Problem Relation Age of Onset   Arthritis Mother    Hyperlipidemia Mother    Heart disease Mother    Hypertension Mother    Uterine cancer Mother    Arthritis Father    Hyperlipidemia Father    Heart disease Father    Hypertension Father    Tuberculosis Father    Hyperlipidemia Sister    Hypertension Sister    Heart disease Sister    Arthritis Maternal Grandmother    Arthritis Maternal Grandfather    Arthritis Paternal Grandmother    Arthritis Paternal Grandfather    Prostate cancer Neg Hx     Social  History:   reports that he has been smoking cigars. He has never used smokeless tobacco. He reports that he does not currently use alcohol. He reports current drug use. Drug: Marijuana.  Medications No current facility-administered medications for this encounter.  Current Outpatient Medications:    acetaminophen (TYLENOL) 500 MG tablet, Take 500 mg by mouth every 6 (six) hours as needed for mild pain., Disp: , Rfl:    albuterol (VENTOLIN HFA) 108 (90 Base) MCG/ACT inhaler, Inhale 1 puff into the lungs every 6 (six) hours as needed for wheezing or shortness of breath., Disp: 18 g, Rfl: 5   allopurinol (ZYLOPRIM) 300 MG tablet, TAKE 1 TABLET BY MOUTH EVERY DAY, Disp: 90 tablet, Rfl: 0   amLODipine (NORVASC) 10 MG tablet, Take 10 mg by mouth daily., Disp: , Rfl:    finasteride (PROSCAR) 5 MG tablet, Take 5 mg by mouth daily., Disp: , Rfl:    furosemide (LASIX) 20 MG tablet, Take 2 tablets (40 mg total) by mouth daily., Disp: 180 tablet, Rfl: 1   minocycline (MINOCIN) 100 MG capsule, TAKE 1 CAPSULE (100 MG TOTAL) BY MOUTH DAILY AS NEEDED (URINARY PROBLEMS)., Disp: 90 capsule, Rfl: 1   Multiple Vitamin (MULTIVITAMIN) tablet, Take 1  tablet by mouth every other day., Disp: , Rfl:    potassium chloride 20 MEQ/15ML (10%) SOLN, TAKE 15 MLS (20 MEQ TOTAL) BY MOUTH EVERY OTHER DAY., Disp: 675 mL, Rfl: 1   rosuvastatin (CRESTOR) 10 MG tablet, Take 1 tablet (10 mg total) by mouth daily., Disp: 90 tablet, Rfl: 1   sildenafil (VIAGRA) 100 MG tablet, Take 1 tablet (100 mg total) by mouth daily as needed., Disp: 30 tablet, Rfl: 1   tamsulosin (FLOMAX) 0.4 MG CAPS capsule, Take 0.4 mg by mouth once a week., Disp: , Rfl:    valsartan-hydrochlorothiazide (DIOVAN-HCT) 320-25 MG tablet, TAKE 1 TABLET BY MOUTH EVERY DAY, Disp: 90 tablet, Rfl: 2  Exam: Current vital signs: BP (!) 154/101   Pulse (!) 111   Resp (!) 24   Wt 108.6 kg   SpO2 100%   BMI 30.74 kg/m  Vital signs in last 24 hours: Pulse Rate:  [111]  111 (12/29 1325) Resp:  [24-28] 24 (12/29 1325) BP: (154)/(101) 154/101 (12/29 1236) SpO2:  [100 %] 100 % (12/29 1325) Weight:  [108.6 kg] 108.6 kg (12/29 1200)  GENERAL:unresponsive, sedated HEENT: - Normocephalic and atraumatic, dry mm, no LN++, no Thyromegally LUNGS - Clear to auscultation bilaterally with no wheezes CV - S1S2 RRR, no m/r/g, equal pulses bilaterally. ABDOMEN - Soft, nontender, nondistended with normoactive BS Ext: warm, well perfused, intact peripheral pulses, no edema  NEURO:  Mental Status: Lethargic, minimally arousable with noxious stimuli States he is 76 years old. Language: mumbling  Cranial Nerves: PERRL 52m/brisk. EOMI, no facial asymmetry, facial sensation intact, hearing intact, tongue/uvula/soft palate midline, normal sternocleidomastoid and trapezius muscle strength. No evidence of tongue atrophy or fasciculations Motor: Nonfocal motor exam, moves extremities relatively equally.  But cannot participate in confrontational strength testing Tone: is normal and bulk is normal Sensation-glasses to painful stimuli in all extremities Coordination: Unable to complete Gait- deferred NIHSS at presentation 8  Labs I have reviewed labs in epic and the results pertinent to this consultation are:   CBC    Component Value Date/Time   WBC 10.1 12/07/2022 1228   RBC 4.63 12/07/2022 1228   HGB 14.4 12/07/2022 1228   HCT 42.0 12/07/2022 1228   PLT 277 12/07/2022 1228   MCV 90.7 12/07/2022 1228   MCH 31.1 12/07/2022 1228   MCHC 34.3 12/07/2022 1228   RDW 14.3 12/07/2022 1228   LYMPHSABS 2.4 12/07/2022 1228   MONOABS 0.8 12/07/2022 1228   EOSABS 0.5 12/07/2022 1228   BASOSABS 0.0 12/07/2022 1228    CMP     Component Value Date/Time   NA 142 12/07/2022 1228   K 3.1 (L) 12/07/2022 1228   CL 106 12/07/2022 1228   CO2 22 12/07/2022 1228   GLUCOSE 123 (H) 12/07/2022 1228   BUN 7 (L) 12/07/2022 1228   CREATININE 1.18 12/07/2022 1228   CREATININE 0.85  11/08/2022 0945   CALCIUM 9.2 12/07/2022 1228   PROT 7.1 12/07/2022 1228   ALBUMIN 4.1 12/07/2022 1228   AST 28 12/07/2022 1228   ALT 18 12/07/2022 1228   ALKPHOS 69 12/07/2022 1228   BILITOT 1.0 12/07/2022 1228   GFRNONAA >60 12/07/2022 1228    Imaging I have reviewed the images obtained:  CT-head- No evidence of acute intracranial abnormality. ASPECTS of 10.   CT Angio Head and Neck-no LVO or significant stenosis in the head or neck  MRI examination of the brain-negative  Assessment:  76y.o. male with PMH of HTN, urinary retention, HLD who  presented to the ED via EMS after being found unresponsive by his wife at 31. EMS was called and on their arrival he was extremely combative and was sedated with '10mg'$  of versed.  Likely had a seizure as he has a tongue bite.  Stat CT head, CT angiogram and stat MRI negative for acute stroke. Will load with keppra and obtain stat EEG  Impression: -Altered mental status-question underlying seizure history with undiagnosed seizures and now presenting with seizures and postictal state. -Evaluate for ongoing seizure activity and status epilepticus.  Recommendations: -Stat MRI brain wo was negative for acute stroke. -EEG -Given a load of Keppra.  Continue Keppra 500 twice daily. -May need Haldol as he is extremely agitated.  Can use Ativan as well. -Check a chest x-ray and UA -Seizure precautions -On examination, has no meningitic signs, no leukocytosis, do not think this is a CNS infection.  Patient seen and examined by NP/APP with MD. MD to update note as needed.   APP CC time 35 min  -- Janine Ores, DNP, FNP-BC [facility] Pager: [phone]  Attending Neurohospitalist Addendum Patient seen and examined with APP/Resident. Agree with the history and physical as documented above. Agree with the plan as documented, which I helped formulate. I have independently reviewed the chart, obtained history, review of systems  and examined the patient.I have personally reviewed pertinent head/neck/spine imaging (CT/MRI). Please feel free to call with any questions.  -- Amie Portland, MD Neurologist [facility] Pager: [phone]  CRITICAL CARE ATTESTATION Performed by: Amie Portland, MD Total critical care time: 40 minutes Critical care time was exclusive of separately billable procedures and treating other patients and/or supervising APPs/Residents/Students Critical care was necessary to treat or prevent imminent or life-threatening deterioration. This patient is critically ill and at significant risk for neurological worsening and/or death and care requires constant monitoring. Critical care was time spent personally by me on the following activities: development of treatment plan with patient and/or surrogate as well as nursing, discussions with consultants, evaluation of patient's response to treatment, examination of patient, obtaining history from patient or surrogate, ordering and performing treatments and interventions, ordering and review of laboratory studies, ordering and review of radiographic studies, pulse oximetry, re-evaluation of patient's condition, participation in multidisciplinary rounds and medical decision making of high complexity in the care of this patient.

## 2022-12-07 NOTE — Code Documentation (Signed)
Edward Wolf is a 76 yr old male with a PMH of HTN, urinary retention and HLD. presenting to [facility] on 12/07/2022. He is from home where he was LKW today at [place] and is now c/o AMS and aphasia. He is not on any known thinners.    Pt was met by stroke team at the bridge. Blood work, CGB obtained. Airway cleared by EDP. Pt to CT with team. NIHSS 8 (please see documentation for times and details). Pt with stupor, follows one command, answers one question, aphasia and dysarthria. No focal abnormality noted-resists exam equally. The following imaging was obtained: CT, CTA and MRI. Per Dr Rory Percy, CT neg for hemorrhage, and CTA negative for LVO. MRI negative for acute abnormality. No TNK as workup negative. No IR as LVO negative.    Pt taken back to trauma A. Placed back on monitor. Pt more awake now, trying to get OOB. Dr. Ashok Cordia notified, and soft wrist restraints placed per VO. Pt placed on condom cath and bedpan. Bed in low position, family and son at bedside.Bedside handoff with Chloe RN.

## 2022-12-07 NOTE — ED Triage Notes (Signed)
Pt BIB [facility] from home as  a code stroke. Per EMS, pt's LKW was 10am this morning ,then family member found him altered , incontinent and combative. '10mg'$  versed of given by EMS.

## 2022-12-07 NOTE — Progress Notes (Signed)
To MRI with patient.

## 2022-12-08 ENCOUNTER — Encounter (HOSPITAL_COMMUNITY): Payer: Self-pay | Admitting: Internal Medicine

## 2022-12-08 ENCOUNTER — Other Ambulatory Visit: Payer: Self-pay

## 2022-12-08 DIAGNOSIS — R569 Unspecified convulsions: Secondary | ICD-10-CM | POA: Diagnosis not present

## 2022-12-08 DIAGNOSIS — R4182 Altered mental status, unspecified: Secondary | ICD-10-CM | POA: Diagnosis not present

## 2022-12-08 LAB — CBC
HCT: 41.6 % (ref 39.0–52.0)
Hemoglobin: 14.9 g/dL (ref 13.0–17.0)
MCH: 31.4 pg (ref 26.0–34.0)
MCHC: 35.8 g/dL (ref 30.0–36.0)
MCV: 87.6 fL (ref 80.0–100.0)
Platelets: 285 10*3/uL (ref 150–400)
RBC: 4.75 MIL/uL (ref 4.22–5.81)
RDW: 14.2 % (ref 11.5–15.5)
WBC: 13.7 10*3/uL — ABNORMAL HIGH (ref 4.0–10.5)
nRBC: 0 % (ref 0.0–0.2)

## 2022-12-08 LAB — COMPREHENSIVE METABOLIC PANEL
ALT: 19 U/L (ref 0–44)
AST: 33 U/L (ref 15–41)
Albumin: 4.2 g/dL (ref 3.5–5.0)
Alkaline Phosphatase: 75 U/L (ref 38–126)
Anion gap: 10 (ref 5–15)
BUN: 6 mg/dL — ABNORMAL LOW (ref 8–23)
CO2: 22 mmol/L (ref 22–32)
Calcium: 9.4 mg/dL (ref 8.9–10.3)
Chloride: 109 mmol/L (ref 98–111)
Creatinine, Ser: 0.89 mg/dL (ref 0.61–1.24)
GFR, Estimated: >60 mL/min (ref >60)
Glucose, Bld: 118 mg/dL — ABNORMAL HIGH (ref 70–99)
Potassium: 2.9 mmol/L — ABNORMAL LOW (ref 3.5–5.1)
Sodium: 141 mmol/L (ref 135–145)
Total Bilirubin: 1.3 mg/dL — ABNORMAL HIGH (ref 0.3–1.2)
Total Protein: 7.6 g/dL (ref 6.5–8.1)

## 2022-12-08 MED ORDER — POTASSIUM CHLORIDE CRYS ER 20 MEQ PO TBCR
40.0000 meq | EXTENDED_RELEASE_TABLET | ORAL | Status: AC
  Administered 2022-12-08 (×2): 40 meq via ORAL
  Filled 2022-12-08 (×3): qty 2

## 2022-12-08 MED ORDER — ORAL CARE MOUTH RINSE: 15.0000 mL | OROMUCOSAL | Status: DC | PRN

## 2022-12-08 MED ORDER — MINOCYCLINE HCL 100 MG PO CAPS: 100.0000 mg | ORAL_CAPSULE | Freq: Every day | ORAL | Status: DC | PRN

## 2022-12-08 MED ORDER — POTASSIUM CHLORIDE CRYS ER 20 MEQ PO TBCR
40.0000 meq | EXTENDED_RELEASE_TABLET | ORAL | Status: DC
  Administered 2022-12-08: 40 meq via ORAL
  Filled 2022-12-08: qty 2

## 2022-12-08 MED ORDER — CHLORHEXIDINE GLUCONATE CLOTH 2 % EX PADS
6.0000 | MEDICATED_PAD | Freq: Every day | CUTANEOUS | Status: DC
  Administered 2022-12-08 – 2022-12-09 (×2): 6 via TOPICAL

## 2022-12-08 MED ORDER — POTASSIUM CHLORIDE 10 MEQ/100ML IV SOLN
10.0000 meq | INTRAVENOUS | Status: DC
  Administered 2022-12-08: 10 meq via INTRAVENOUS
  Filled 2022-12-08: qty 100

## 2022-12-08 MED ORDER — AMLODIPINE BESYLATE 10 MG PO TABS
10.0000 mg | ORAL_TABLET | Freq: Every day | ORAL | Status: DC
  Administered 2022-12-08 – 2022-12-09 (×2): 10 mg via ORAL
  Filled 2022-12-08 (×2): qty 1

## 2022-12-08 MED ORDER — FINASTERIDE 5 MG PO TABS
5.0000 mg | ORAL_TABLET | Freq: Every day | ORAL | Status: DC
  Administered 2022-12-08 – 2022-12-09 (×2): 5 mg via ORAL
  Filled 2022-12-08 (×2): qty 1

## 2022-12-08 NOTE — Progress Notes (Signed)
MD made aware of potassium of 2.9 this morning.

## 2022-12-08 NOTE — Progress Notes (Addendum)
PROGRESS NOTE    Edward Wolf  BHA:[id number] DOB: [birth year] DOA: 12/07/2022 PCP: Wardell Honour, MD   Brief Narrative:  Edward Wolf is a 76 y.o. male with medical history significant of HTN, BPH and HLD.  Patient was brought in initially as a code stroke as family found him altered the morning of admission around 10 AM.  Patient found to be combative as well as notable episode of incontinence -initially requiring Versed in the ED to safely evaluate the patient. Currently the patient is more confused than normal.  Baseline is taking care of his own ADLs per wife.  Patient admitted as above with acute metabolic encephalopathy concerning for seizure and postictal state, initial MRI was negative EEG initiated and patient loaded with Keppra per neurology.  Hospitalist called for admission  Assessment & Plan:   Principal Problem:   Seizures ([place]) Active Problems:   Hypertension   Lower urinary tract symptoms (LUTS)   Hypokalemia  Acute metabolic encephalopathy -Suspected seizure as primary etiology -MRI, CTA negative, EEG initially unremarkable -Keppra ongoing -UDS positive for THC and benzos -although benzos are likely secondary to treatment by EMS or the ED -Infection less likely, UA within normal limits without fever or white count  Hypokalemia             -repleted +40IV +80PO -follow repeat labs             -Mg within normal limits  BPH -Chronic, follows with Dr. Junious Silk -Foley for now voiding trial in the next 24 hours as mental status improves   HTN, essential -Resume home medications  Questionable heart failure diagnosis -Not in acute exacerbation regardless, given patient's medication list on diuretics, antihypertensives and potassium without clear etiology - no echo available on file and no indication to perform one at this time, defer to outpatient PCP for further workup  Marijuana use/abuse -Patient denied recent use despite positive labs, educated on risks of  ongoing substance abuse   DVT prophylaxis: heparin  Code Status: full  Family Communication: None present  Status is: Inpatient  Dispo: The patient is from: Home              Anticipated d/c is to: To be determined              Anticipated d/c date is: 24 to 48 hours              Patient currently not medically stable for discharge given ongoing need for close monitoring, IV medications in the setting of poor p.o. intake further workup for encephalopathy including imaging seizure workup and profound hypokalemia  Consultants:  Neurology  Procedures:  None  Antimicrobials:  None  Subjective: No acute issues or events overnight patient more awake and alert this morning difficult to assess orientation but appears to be in good spirits denies nausea vomiting diarrhea constipation headache fevers chills or chest pain  Objective: Vitals:   12/08/22 0111 12/08/22 0207 12/08/22 0604 12/08/22 0716  BP: (!) 171/103 (!) 162/104 123/71 125/66  Pulse: 100 (!) 102 (!) 106   Resp: '16 16 16 19  '$ Temp: 98.2 F (36.8 C) 98.8 F (37.1 C) 99.1 F (37.3 C) 99 F (37.2 C)  TempSrc: Oral Oral Oral Oral  SpO2: 100% 100% 100% 98%  Weight:      Height:        Intake/Output Summary (Last 24 hours) at 12/08/2022 0742 Last data filed at 12/07/2022 2319 Gross per 24 hour  Intake 190.74 ml  Output 2250 ml  Net -2059.26 ml   Filed Weights   12/07/22 1200 12/07/22 2023  Weight: 108.6 kg 96.7 kg    Examination:  General:  Pleasantly resting in bed, No acute distress. HEENT:  Normocephalic atraumatic.  Sclerae nonicteric, noninjected.  Extraocular movements intact bilaterally. Neck:  Without mass or deformity.  Trachea is midline. Lungs:  Clear to auscultate bilaterally without rhonchi, wheeze, or rales. Heart:  Regular rate and rhythm.  Without murmurs, rubs, or gallops. Abdomen:  Soft, nontender, nondistended.  Without guarding or rebound. Extremities: Without cyanosis, clubbing, edema, or  obvious deformity. Vascular:  Dorsalis pedis and posterior tibial pulses palpable bilaterally. Skin:  Warm and dry, no erythema  Data Reviewed: I have personally reviewed following labs and imaging studies  CBC: Recent Labs  Lab 12/07/22 1221 12/07/22 1228 12/08/22 0249  WBC  --  10.1 13.7*  NEUTROABS  --  6.4  --   HGB 14.3 14.4 14.9  HCT 42.0 42.0 41.6  MCV  --  90.7 87.6  PLT  --  277 867   Basic Metabolic Panel: Recent Labs  Lab 12/07/22 1221 12/07/22 1228 12/08/22 0249  NA 141 142 141  K 3.7 3.1* 2.9*  CL 103 106 109  CO2  --  22 22  GLUCOSE 116* 123* 118*  BUN 9 7* 6*  CREATININE 0.90 1.18 0.89  CALCIUM  --  9.2 9.4  MG  --  2.0  --    GFR: Estimated Creatinine Clearance: 87.8 mL/min (by C-G formula based on SCr of 0.89 mg/dL). Liver Function Tests: Recent Labs  Lab 12/07/22 1228 12/08/22 0249  AST 28 33  ALT 18 19  ALKPHOS 69 75  BILITOT 1.0 1.3*  PROT 7.1 7.6  ALBUMIN 4.1 4.2   No results for input(s): "LIPASE", "AMYLASE" in the last 168 hours. No results for input(s): "AMMONIA" in the last 168 hours. Coagulation Profile: Recent Labs  Lab 12/07/22 1228  INR 1.0   Cardiac Enzymes: No results for input(s): "CKTOTAL", "CKMB", "CKMBINDEX", "TROPONINI" in the last 168 hours. BNP (last 3 results) No results for input(s): "PROBNP" in the last 8760 hours. HbA1C: No results for input(s): "HGBA1C" in the last 72 hours. CBG: Recent Labs  Lab 12/07/22 1216  GLUCAP 112*   Lipid Profile: No results for input(s): "CHOL", "HDL", "LDLCALC", "TRIG", "CHOLHDL", "LDLDIRECT" in the last 72 hours. Thyroid Function Tests: No results for input(s): "TSH", "T4TOTAL", "FREET4", "T3FREE", "THYROIDAB" in the last 72 hours. Anemia Panel: No results for input(s): "VITAMINB12", "FOLATE", "FERRITIN", "TIBC", "IRON", "RETICCTPCT" in the last 72 hours. Sepsis Labs: No results for input(s): "PROCALCITON", "LATICACIDVEN" in the last 168 hours.  No results found for  this or any previous visit (from the past 240 hour(s)).       Radiology Studies: DG Chest Port 1 View  Result Date: 12/07/2022 CLINICAL DATA:  Altered mental status. EXAM: PORTABLE CHEST 1 VIEW COMPARISON:  August 24, 2014. FINDINGS: Mild cardiomegaly is noted. Both lungs are clear. The visualized skeletal structures are unremarkable. IMPRESSION: No active disease. Electronically Signed   By: Marijo Conception M.D.   On: 12/07/2022 16:17   EEG adult  Result Date: 12/07/2022 Lora Havens, MD     12/07/2022  3:07 PM Patient Name: Edward Wolf MRN: [id number] Epilepsy Attending: Lora Havens Referring Physician/Provider: Janine Ores, NP Date: 12/07/2022 Duration: 21.46 mins Patient history: 76yo M with ams. EEG to evaluate for seizure Level of alertness: Awake AEDs during EEG study: versed Technical  aspects: This EEG study was done with scalp electrodes positioned according to the 10-20 International system of electrode placement. Electrical activity was reviewed with band pass filter of 1-'70Hz'$ , sensitivity of 7 uV/mm, display speed of 42m/sec with a '60Hz'$  notched filter applied as appropriate. EEG data were recorded continuously and digitally stored.  Video monitoring was available and reviewed as appropriate. Description: No clear posterior dominant rhythm was seen. There is an excessive amount of 15 to 18 Hz beta activity distributed symmetrically and diffusely. Hyperventilation and photic stimulation were not performed.   ABNORMALITY - Excessive beta, generalized IMPRESSION: This study is within normal limits. The excessive beta activity seen in the background is most likely due to the effect of benzodiazepine and is a benign EEG pattern. No seizures or epileptiform discharges were seen throughout the recording. A normal interictal EEG does not exclude the diagnosis of epilepsy. PLora Havens  MR BRAIN WO CONTRAST  Result Date: 12/07/2022 CLINICAL DATA:  Altered mental status EXAM:  MRI HEAD WITHOUT CONTRAST TECHNIQUE: Multiplanar, multiecho pulse sequences of the brain and surrounding structures were obtained without intravenous contrast. COMPARISON:  Same-day CT/CTA head and neck FINDINGS: Brain: There is no acute intracranial hemorrhage, extra-axial fluid collection, or acute infarct. Parenchymal volume is within expected limits for age. The ventricles are normal in size. Gray-white differentiation is preserved. There is patchy and confluent FLAIR signal abnormality throughout the supratentorial white matter likely reflecting sequela of moderate chronic small-vessel ischemic change. There is a small remote infarct in the midline dorsal pons. There is no mass lesion.  There is no mass effect or midline shift. Vascular: Normal flow voids. Skull and upper cervical spine: Unremarkable. Sinuses/Orbits: There is a small retention cyst in the right maxillary sinus. The globes and orbits are unremarkable. Other: None. IMPRESSION: 1. No acute intracranial pathology. 2. Moderate background chronic small-vessel ischemic change. Electronically Signed   By: PValetta MoleM.D.   On: 12/07/2022 13:34   CT ANGIO HEAD NECK W WO CM (CODE STROKE)  Result Date: 12/07/2022 CLINICAL DATA:  Neuro deficit, acute, stroke suspected. EXAM: CT ANGIOGRAPHY HEAD AND NECK TECHNIQUE: Multidetector CT imaging of the head and neck was performed using the standard protocol during bolus administration of intravenous contrast. Multiplanar CT image reconstructions and MIPs were obtained to evaluate the vascular anatomy. Carotid stenosis measurements (when applicable) are obtained utilizing NASCET criteria, using the distal internal carotid diameter as the denominator. RADIATION DOSE REDUCTION: This exam was performed according to the departmental dose-optimization program which includes automated exposure control, adjustment of the mA and/or kV according to patient size and/or use of iterative reconstruction technique.  CONTRAST:  730mOMNIPAQUE IOHEXOL 350 MG/ML SOLN COMPARISON:  None Available. FINDINGS: CTA NECK FINDINGS Aortic arch: Normal variant aortic arch branching pattern with common origin of the brachiocephalic and left common carotid arteries. Wide patency of the brachiocephalic and subclavian arteries. Right carotid system: Patent without evidence of stenosis, dissection, or significant atherosclerosis. Left carotid system: Patent with minimal atherosclerotic plaque in the distal common carotid artery. No evidence of stenosis or dissection. Vertebral arteries: Patent and codominant without evidence of stenosis, dissection, or significant atherosclerosis. Skeleton: Advanced cervical disc degeneration. Other neck: No evidence of cervical lymphadenopathy or mass. Upper chest: No consolidation or mass in the included lung apices. Review of the MIP images confirms the above findings CTA HEAD FINDINGS Anterior circulation: The internal carotid arteries are widely patent from skull base to carotid termini with minimal nonstenotic atherosclerotic plaque bilaterally. ACAs and  MCAs are patent without evidence of a proximal branch occlusion or significant proximal stenosis. Branch vessel atherosclerosis is more notable in the distal left MCA branch vessels compared to the right. No aneurysm is identified. Posterior circulation: The intracranial vertebral arteries are widely patent to the basilar. The basilar artery is widely patent. Patent SCA is are seen bilaterally. There is a small left posterior communicating artery. Both PCAs are patent without evidence of a significant proximal stenosis. No aneurysm is identified. Venous sinuses: Patent. Anatomic variants: None. Review of the MIP images confirms the above findings These results were communicated to Dr. Rory Percy at 12:35 pm on 12/07/2022 by text page via the [facility] messaging system. IMPRESSION: No large vessel occlusion or significant proximal stenosis in the head or neck.  Electronically Signed   By: Logan Bores M.D.   On: 12/07/2022 12:46   CT HEAD CODE STROKE WO CONTRAST  Result Date: 12/07/2022 CLINICAL DATA:  Code stroke. Neuro deficit, acute, stroke suspected. EXAM: CT HEAD WITHOUT CONTRAST TECHNIQUE: Contiguous axial images were obtained from the base of the skull through the vertex without intravenous contrast. RADIATION DOSE REDUCTION: This exam was performed according to the departmental dose-optimization program which includes automated exposure control, adjustment of the mA and/or kV according to patient size and/or use of iterative reconstruction technique. COMPARISON:  None Available. FINDINGS: Brain: Within limitations of mild motion artifact, there is no evidence of an acute cortically based infarct, intracranial hemorrhage, mass, midline shift, or extra-axial fluid collection. Patchy to confluent hypodensities in the cerebral white matter bilaterally are nonspecific but compatible with moderate chronic small vessel ischemic disease. There is mild cerebral atrophy. Vascular: No hyperdense vessel. Skull: No fracture or suspicious osseous lesion. Sinuses/Orbits: Small mucous retention cyst in the right maxillary sinus. Clear mastoid air cells. Unremarkable orbits. Other: None. ASPECTS ([place] Stroke Program Early CT Score) - Ganglionic level infarction (caudate, lentiform nuclei, internal capsule, insula, M1-M3 cortex): 7 - Supraganglionic infarction (M4-M6 cortex): 3 Total score (0-10 with 10 being normal): 10 These results were communicated to Dr. Rory Percy at 12:35 pm on 12/07/2022 by text page via the [facility] messaging system. IMPRESSION: 1. No evidence of acute intracranial abnormality. ASPECTS of 10. 2. Moderate chronic small vessel ischemic disease. Electronically Signed   By: Logan Bores M.D.   On: 12/07/2022 12:35    Scheduled Meds:  Chlorhexidine Gluconate Cloth  6 each Topical Daily   heparin  5,000 Units Subcutaneous Q8H   Continuous Infusions:    LOS: 1 day   Time spent: 38mn  Keonta Alsip C Danniel Tones, DO [facility]  If 7PM-7AM, please contact night-coverage [email or web address]  12/08/2022, 7:42 AM

## 2022-12-08 NOTE — Evaluation (Signed)
Clinical/Bedside Swallow Evaluation Patient Details  Name: Edward Wolf MRN: [id number] Date of Birth: [birth year]  Today's Date: 12/08/2022 Time: SLP Start Time (ACUTE ONLY): 1222 SLP Stop Time (ACUTE ONLY): 1227 SLP Time Calculation (min) (ACUTE ONLY): 5 min  Past Medical History:  Past Medical History:  Diagnosis Date   Arthritis    Asthma    Colon polyps    Gout    Hay fever    Hyperlipidemia    Hypertension    Positive TB test    Urinary retention with incomplete bladder emptying    Past Surgical History:  Past Surgical History:  Procedure Laterality Date   COLONOSCOPY     HERNIA REPAIR  1998   TOOTH EXTRACTION     WRIST SURGERY  1964   HPI:  Edward Wolf is a 76 y.o. male who was brought in initially 12/29 as a code stroke as family found him altered the morning of admission around 10 AM.  MRI and CXR 12/29 with no acute findings.  EEG 12/29 WNL.  Pt with medical history significant of HTN, BPH and HLD.    Assessment / Plan / Recommendation  Clinical Impression  Pt prsents with functional swallowing ability as assessed clinically.  Pt asleep on SLP arrival, but easily roused.  Pt with missing dentition.  States he has dentures but does not wear them typically with PO intake.  Declined denture placement for PO trials.  Pt tolerated all consistencies trialed with no clinical s/s of aspiration and exhibited good oral clearance of solids.  Pt has no further ST needs.  SLP will sign off at this time.    Recommend continuing regular texture diet with thin liquids.  SLP Visit Diagnosis: Dysphagia, unspecified (R13.10)    Aspiration Risk  No limitations    Diet Recommendation Regular;Thin liquid   Liquid Administration via: Cup;Straw Medication Administration: Whole meds with liquid Compensations: Slow rate;Small sips/bites Postural Changes: Seated upright at 90 degrees    Other  Recommendations Oral Care Recommendations: Oral care BID    Recommendations for follow up  therapy are one component of a multi-disciplinary discharge planning process, led by the attending physician.  Recommendations may be updated based on patient status, additional functional criteria and insurance authorization.  Follow up Recommendations No SLP follow up      Assistance Recommended at Discharge  N/A  Functional Status Assessment Patient has not had a recent decline in their functional status  Frequency and Duration  (N/A)          Prognosis Prognosis for Safe Diet Advancement:  (N/A)      Swallow Study   General Date of Onset: 12/07/22 HPI: Edward Wolf is a 76 y.o. male who was brought in initially 12/29 as a code stroke as family found him altered the morning of admission around 10 AM.  MRI and CXR 12/29 with no acute findings.  EEG 12/29 WNL.  Pt with medical history significant of HTN, BPH and HLD. Type of Study: Bedside Swallow Evaluation Previous Swallow Assessment: none Diet Prior to this Study: Regular;Thin liquids Temperature Spikes Noted: No Respiratory Status: Room air History of Recent Intubation: No Behavior/Cognition: Cooperative;Pleasant mood;Alert Oral Cavity Assessment: Within Functional Limits Oral Care Completed by SLP: No Oral Cavity - Dentition: Missing dentition (pt declined denture placement, states he does not regularly wear with PO intake) Patient Positioning: Upright in bed Baseline Vocal Quality: Normal Volitional Cough: Strong Volitional Swallow: Able to elicit    Oral/Motor/Sensory Function Overall Oral Motor/Sensory Function:  Mild impairment Facial ROM: Reduced left Facial Symmetry: Within Functional Limits Lingual ROM: Within Functional Limits Lingual Symmetry: Within Functional Limits Lingual Strength: Within Functional Limits Velum: Within Functional Limits Mandible: Within Functional Limits   Ice Chips Ice chips: Not tested   Thin Liquid Thin Liquid: Within functional limits Presentation: Straw    Nectar Thick Nectar  Thick Liquid: Not tested   Honey Thick Honey Thick Liquid: Not tested   Puree Puree: Within functional limits Presentation: Spoon   Solid     Solid: Within functional limits Presentation:  (SLP fed)      Edward Wolf, Edward Wolf, Edward Wolf Office: [phone] 12/08/2022,2:36 PM

## 2022-12-08 NOTE — Progress Notes (Signed)
Dr. Nevada Crane and Mindy rapid response nurse contacted about Red Mews. Stat dose of Hydralazine ordered. Patient fatigue, drowsy, confused and agitated. BP remains elevated. Has received Metoprolol twice IV as ordered today. No symptoms of pain or distress. Rapid respiratory rate. Less agitated since foley catheter was assessed and advanced, allowing for nearly 1657m of urine to drain. Patient was restless and agitated due to catheter being malpositioned, bladder being full, and discomfort being experienced. Urine is now less bloody and yellow-clear in color. Will obtain VS every hour for the next 4 hours and then Every 4 hours per MEWs protocol.  12/07/22 2205  Assess: MEWS Score  Temp 98.7 F (37.1 C)  BP (!) 192/109  MAP (mmHg) 132  Pulse Rate 89  ECG Heart Rate 98  Resp (!) 29  SpO2 100 %  O2 Device Room Air  Assess: MEWS Score  MEWS Temp 0  MEWS Systolic 0  MEWS Pulse 0  MEWS RR 2  MEWS LOC 2  MEWS Score 4  MEWS Score Color Red  Assess: if the MEWS score is Yellow or Red  Were vital signs taken at a resting state? Yes  Focused Assessment No change from prior assessment  Does the patient meet 2 or more of the SIRS criteria? No  Does the patient have a confirmed or suspected source of infection? No  Provider and Rapid Response Notified? Yes  MEWS guidelines implemented *See Row Information* Yes  Treat  Pain Scale Faces  Faces Pain Scale 0  Take Vital Signs  Increase Vital Sign Frequency  Red: Q 1hr X 4 then Q 4hr X 4, if remains red, continue Q 4hrs  Escalate  MEWS: Escalate Red: discuss with charge nurse/RN and provider, consider discussing with RRT  Notify: Charge Nurse/RN  Name of Charge Nurse/RN Notified Phyl RN  Date Charge Nurse/RN Notified 12/07/22  Time Charge Nurse/RN Notified 2220  Provider Notification  Provider Name/Title Dr. CAileen Fass Date Provider Notified 12/07/22  Time Provider Notified 2227  Method of Notification Page  Notification Reason Critical  Result (red mews)  Notify: Rapid Response  Name of Rapid Response RN Notified Mindy RR-RN  Date Rapid Response Notified 12/07/22  Time Rapid Response Notified 2223  Document  Patient Outcome Stabilized after interventions  Progress note created (see row info) Yes  Assess: SIRS CRITERIA  SIRS Temperature  0  SIRS Pulse 1  SIRS Respirations  1  SIRS WBC 0  SIRS Score Sum  2

## 2022-12-08 NOTE — Progress Notes (Signed)
Neurology Progress Note  Brief HPI: Edward Wolf is a 76 y.o. male with PMH of HTN, urinary retention, HLD who presented to the ED via EMS after being found unresponsive by his wife at 73. EMS was called and on their arrival he was extremely combative and was sedated with '10mg'$  of versed.  His wife tells me that she last saw him normal around 9 AM before she left and he was loading the dishwasher in the kitchen.  His wife tells me that he fell yesterday, but does not remember if he hit his head.  She did not witness his fall, she was at the grocery store and he told her about it when she got home.  She also tells me that over the last couple of years he has had choking spells and after these spells he does not remember them.  He has also been reporting numbness in his hands and feet, but more severe in his hands.     Subjective: Patient seen in room. Exam is largely improved today. His wife states that he is a lot better but not quite at his baseline. The patient himself states that he feels great.    Exam: Vitals:   12/08/22 0604 12/08/22 0716  BP: 123/71 125/66  Pulse: (!) 106   Resp: 16 19  Temp: 99.1 F (37.3 C) 99 F (37.2 C)  SpO2: 100% 98%   Gen: In bed, NAD Resp: non-labored breathing, no acute distress Abd: soft, nt  Neuro: Mental Status: Alert and oriented to person and place, but does not remember what happened yesterday before coming to the ED.  Speech is clear.  No signs of aphasia or neglect Cranial Nerves: II: Visual Fields are full. PERRL.   III,IV, VI: EOMI without diploplia. Baseline ptosis on the left  V: Facial sensation is symmetric to temperature VII: Facial movement is symmetric resting and smiling VIII: Hearing is intact to voice X: Palate elevates symmetrically XI: Shoulder shrug is symmetric. XII: Tongue protrudes midline without atrophy or fasciculations.  Motor: Tone is normal. Bulk is normal.  left hand is weak in comparison, slight left palmar  drift with arm extension Sensory: Sensation is symmetric to light touch and temperature in the arms and legs. No extinction to DSS present.  Cerebellar: FNF and HKS are intact bilaterally, rapid alternating movements Gait: steady or deferred for safety    Pertinent Labs: K 2.9 WBC 13.7 UA positive for leukocytes and bacteria  Micro pending  UDS positive for benzos and THC  Imaging Reviewed:  CT Head 1. No evidence of acute intracranial abnormality. ASPECTS of 10. 2. Moderate chronic small vessel ischemic disease.  CTA Head and Neck No large vessel occlusion or significant proximal stenosis in the head or neck.  MRI  1. No acute intracranial pathology. 2. Moderate background chronic small-vessel ischemic change.  EEG- This study is within normal limits. The excessive beta activity seen in the background is most likely due to the effect of benzodiazepine and is a benign EEG pattern. No seizures or epileptiform discharges were seen throughout the recording.   Assessment:  76 y.o. male with PMH of HTN, urinary retention, HLD who presented to the ED via EMS after being found unresponsive by his wife at 24. EMS was called and on their arrival he was extremely combative and was sedated with '10mg'$  of versed. Initial presentation concerning for a seizure, however he additionally had retained over 1L of urine. Consider vasovagal response with urinary retention in the  setting of a UTI  Impression:  Vasovagal response from urinary retention? Confusion in the setting of UTI?  Recommendations: - Outpatient follow up for concerns of carpal tunnel - With to make appointment with outpatient urologist as well - Treat metabolic derangements - Urinalysis shows leukocytes and bacteria, micro is pending    Patient seen and examined by NP/APP with MD. MD to update note as needed.   Janine Ores, DNP, FNP-BC [facility] Pager: [phone]   Attending Neurohospitalist  Addendum Patient seen and examined with APP/Resident. Agree with the history and physical as documented above. Agree with the plan as documented, which I helped formulate. I have independently reviewed the chart, obtained history, review of systems and examined the patient.I have personally reviewed pertinent head/neck/spine imaging (CT/MRI). Please feel free to call with any questions.  -- Amie Portland, MD Neurologist [facility] Pager: [phone]

## 2022-12-09 DIAGNOSIS — R569 Unspecified convulsions: Secondary | ICD-10-CM | POA: Diagnosis not present

## 2022-12-09 LAB — BASIC METABOLIC PANEL
Anion gap: 10 (ref 5–15)
BUN: 13 mg/dL (ref 8–23)
CO2: 22 mmol/L (ref 22–32)
Calcium: 9 mg/dL (ref 8.9–10.3)
Chloride: 108 mmol/L (ref 98–111)
Creatinine, Ser: 1.05 mg/dL (ref 0.61–1.24)
GFR, Estimated: >60 mL/min (ref >60)
Glucose, Bld: 109 mg/dL — ABNORMAL HIGH (ref 70–99)
Potassium: 3.2 mmol/L — ABNORMAL LOW (ref 3.5–5.1)
Sodium: 140 mmol/L (ref 135–145)

## 2022-12-09 LAB — CBC
HCT: 39.7 % (ref 39.0–52.0)
Hemoglobin: 13.1 g/dL (ref 13.0–17.0)
MCH: 30.1 pg (ref 26.0–34.0)
MCHC: 33 g/dL (ref 30.0–36.0)
MCV: 91.3 fL (ref 80.0–100.0)
Platelets: 246 10*3/uL (ref 150–400)
RBC: 4.35 MIL/uL (ref 4.22–5.81)
RDW: 14.6 % (ref 11.5–15.5)
WBC: 9.2 10*3/uL (ref 4.0–10.5)
nRBC: 0 % (ref 0.0–0.2)

## 2022-12-09 MED ORDER — FUROSEMIDE 10 MG/ML IJ SOLN
20.0000 mg | Freq: Once | INTRAMUSCULAR | Status: AC
  Administered 2022-12-09: 20 mg via INTRAVENOUS
  Filled 2022-12-09: qty 4

## 2022-12-09 MED ORDER — POTASSIUM CHLORIDE CRYS ER 20 MEQ PO TBCR
40.0000 meq | EXTENDED_RELEASE_TABLET | ORAL | Status: AC
  Administered 2022-12-09: 40 meq via ORAL
  Filled 2022-12-09 (×2): qty 2

## 2022-12-09 MED ORDER — POTASSIUM CHLORIDE CRYS ER 20 MEQ PO TBCR
40.0000 meq | EXTENDED_RELEASE_TABLET | Freq: Once | ORAL | Status: AC
  Administered 2022-12-09: 40 meq via ORAL

## 2022-12-09 NOTE — Discharge Summary (Signed)
Physician Discharge Summary  [name] OIZ:[id number] DOB: May 05, 1946 DOA: 12/07/2022  PCP: Wardell Honour, MD  Admit date: 12/07/2022 Discharge date: 12/09/2022  Admitted From: Home Disposition:  Home  Recommendations for Outpatient Follow-up:  Follow up with PCP in 1-2 weeks Please obtain BMP/CBC in one week - may need supplemental potassium pending repeat labs.  Home Health:None  Equipment/Devices:None  Discharge Condition:Stable  CODE STATUS:Full  Diet recommendation: Low salt low fat diet    Brief/Interim Summary: Edward Wolf is a 76 y.o. male with medical history significant of HTN, BPH and HLD.  Patient was brought in initially as a code stroke as family found him altered the morning of admission around 10 AM.  Patient found to be combative as well as notable episode of incontinence -initially requiring Versed in the ED to safely evaluate the patient. Currently the patient is more confused than normal.  Baseline is taking care of his own ADLs per wife.   Patient admitted initially with acute metabolic encephalopathy concerning for seizure and postictal state, initial MRI was negative EEG unremarkable. At this time his event is unlikely to be seizure in origin per discussion with Neuro - no need for further AEDs or workup. Concern that his mental status was polypharmacy/metabolic in nature but has returned to baseline now without any further episodes or events. Otherwise stable for DC. Close follow up with PCP for ongoing evaluation and treatment.  Recommend close follow-up with urology, does not appear to have seen their office in centimeters, given transient episode of urinary obstruction at intake now resolved.  Possibly secondary to mental status changes however given his history of urinary obstruction might be prudent to make appointment further office in the interim.  Discharge Diagnoses:  Principal Problem:   Seizures ([place]) Active Problems:   Hypertension   Lower  urinary tract symptoms (LUTS)   Hypokalemia  Acute metabolic encephalopathy Seizure ruled out Questionable polypharmacy, THC positive Cannot rule out vagal event given urinary retention as below -Neuro does not find his presentation consistent with seizure -no medication changes at this time -MRI CTA and EEG unremarkable -No further antiepileptics per neurology -UDS positive for benzos and THC, benzos may be secondary to EMS administration in the field -Urinary obstruction, transient see below  Hypokalemia -Follow repeat labs with PCP   BPH -Chronic, follows with Dr. Junious Silk -notes he has not been to this office in "a long time" -last office note 2016 -Foley placed at intake with high-volume output, Foley discontinued, voiding trial without incident.  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   albuterol 108 (90 Base) MCG/ACT inhaler Commonly known as: VENTOLIN HFA Inhale 1 puff into the lungs every 6 (six) hours as needed for wheezing or shortness of breath.   allopurinol 300 MG tablet  Commonly known as: ZYLOPRIM TAKE 1 TABLET BY MOUTH EVERY DAY   amLODipine 10 MG tablet Commonly known as: NORVASC Take 10 mg by mouth daily.   finasteride 5 MG tablet Commonly  known as: PROSCAR Take 5 mg by mouth daily.   furosemide 20 MG tablet Commonly known as: LASIX Take 2 tablets (40 mg total) by mouth daily.   minocycline 100 MG capsule Commonly known as: MINOCIN TAKE 1 CAPSULE (100 MG TOTAL) BY MOUTH DAILY AS NEEDED (URINARY PROBLEMS).   multivitamin tablet Take 1 tablet by mouth every other day.   potassium chloride 20 MEQ/15ML (10%) Soln TAKE 15 MLS (20 MEQ TOTAL) BY MOUTH EVERY OTHER DAY.   rosuvastatin 10 MG tablet Commonly known as: CRESTOR Take 1 tablet (10 mg total) by mouth daily.   sildenafil 100 MG tablet Commonly known as: VIAGRA Take 1 tablet (100 mg total) by mouth daily as needed.   valsartan-hydrochlorothiazide 320-25 MG tablet Commonly known as: DIOVAN-HCT TAKE 1 TABLET BY MOUTH EVERY DAY        No Known Allergies  Consultations: Neuro   Procedures/Studies: DG Chest Port 1 View  Result Date: 12/07/2022 CLINICAL DATA:  Altered mental status. EXAM: PORTABLE CHEST 1 VIEW COMPARISON:  August 24, 2014. FINDINGS: Mild cardiomegaly is noted. Both lungs are clear. The visualized skeletal structures are unremarkable. IMPRESSION: No active disease. Electronically Signed   By: Marijo Conception M.D.   On: 12/07/2022 16:17   EEG adult  Result Date: 12/07/2022 Lora Havens, MD     12/07/2022  3:07 PM Patient Name: Edward Wolf MRN: [id number] Epilepsy Attending: Lora Havens Referring Physician/Provider: Janine Ores, NP Date: 12/07/2022 Duration: 21.46 mins Patient history: 76yo M with ams. EEG to evaluate for seizure Level of alertness: Awake AEDs during EEG study: versed Technical aspects: This EEG study was done with scalp electrodes positioned according to the 10-20 International system of electrode placement. Electrical activity was reviewed with band pass filter of 1-'70Hz'$ , sensitivity of 7 uV/mm, display speed of 42m/sec with a '60Hz'$  notched filter applied as appropriate. EEG data were recorded continuously and  digitally stored.  Video monitoring was available and reviewed as appropriate. Description: No clear posterior dominant rhythm was seen. There is an excessive amount of 15 to 18 Hz beta activity distributed symmetrically and diffusely. Hyperventilation and photic stimulation were not performed.   ABNORMALITY - Excessive beta, generalized IMPRESSION: This study is within normal limits. The excessive beta activity seen in the background is most likely due to the effect of benzodiazepine and is a benign EEG pattern. No seizures or epileptiform discharges were seen throughout the recording. A normal interictal EEG does not exclude the diagnosis of epilepsy. PLora Havens  MR BRAIN WO CONTRAST  Result Date: 12/07/2022 CLINICAL DATA:  Altered mental status EXAM: MRI HEAD WITHOUT CONTRAST TECHNIQUE: Multiplanar, multiecho pulse sequences of the brain and surrounding structures were obtained without intravenous contrast. COMPARISON:  Same-day CT/CTA head and neck FINDINGS: Brain: There is no acute intracranial hemorrhage, extra-axial fluid collection, or acute infarct. Parenchymal volume is within expected limits for age. The ventricles are normal in size. Gray-white differentiation is preserved. There is patchy and confluent FLAIR signal abnormality throughout the supratentorial white matter likely reflecting sequela of moderate chronic small-vessel ischemic change. There is a small remote infarct in the midline dorsal pons. There is no mass lesion.  There is no mass effect or midline shift. Vascular: Normal flow voids. Skull and upper cervical spine: Unremarkable. Sinuses/Orbits:  There is a small retention cyst in the right maxillary sinus. The globes and orbits are unremarkable. Other: None. IMPRESSION: 1. No acute intracranial pathology. 2. Moderate background chronic small-vessel ischemic change. Electronically Signed   By: Valetta Mole M.D.   On: 12/07/2022 13:34   CT ANGIO HEAD NECK W WO CM (CODE  STROKE)  Result Date: 12/07/2022 CLINICAL DATA:  Neuro deficit, acute, stroke suspected. EXAM: CT ANGIOGRAPHY HEAD AND NECK TECHNIQUE: Multidetector CT imaging of the head and neck was performed using the standard protocol during bolus administration of intravenous contrast. Multiplanar CT image reconstructions and MIPs were obtained to evaluate the vascular anatomy. Carotid stenosis measurements (when applicable) are obtained utilizing NASCET criteria, using the distal internal carotid diameter as the denominator. RADIATION DOSE REDUCTION: This exam was performed according to the departmental dose-optimization program which includes automated exposure control, adjustment of the mA and/or kV according to patient size and/or use of iterative reconstruction technique. CONTRAST:  11m OMNIPAQUE IOHEXOL 350 MG/ML SOLN COMPARISON:  None Available. FINDINGS: CTA NECK FINDINGS Aortic arch: Normal variant aortic arch branching pattern with common origin of the brachiocephalic and left common carotid arteries. Wide patency of the brachiocephalic and subclavian arteries. Right carotid system: Patent without evidence of stenosis, dissection, or significant atherosclerosis. Left carotid system: Patent with minimal atherosclerotic plaque in the distal common carotid artery. No evidence of stenosis or dissection. Vertebral arteries: Patent and codominant without evidence of stenosis, dissection, or significant atherosclerosis. Skeleton: Advanced cervical disc degeneration. Other neck: No evidence of cervical lymphadenopathy or mass. Upper chest: No consolidation or mass in the included lung apices. Review of the MIP images confirms the above findings CTA HEAD FINDINGS Anterior circulation: The internal carotid arteries are widely patent from skull base to carotid termini with minimal nonstenotic atherosclerotic plaque bilaterally. ACAs and MCAs are patent without evidence of a proximal branch occlusion or significant proximal  stenosis. Branch vessel atherosclerosis is more notable in the distal left MCA branch vessels compared to the right. No aneurysm is identified. Posterior circulation: The intracranial vertebral arteries are widely patent to the basilar. The basilar artery is widely patent. Patent SCA is are seen bilaterally. There is a small left posterior communicating artery. Both PCAs are patent without evidence of a significant proximal stenosis. No aneurysm is identified. Venous sinuses: Patent. Anatomic variants: None. Review of the MIP images confirms the above findings These results were communicated to Dr. ARory Percyat 12:35 pm on 12/07/2022 by text page via the [facility]. IMPRESSION: No large vessel occlusion or significant proximal stenosis in the head or neck. Electronically Signed   By: ALogan BoresM.D.   On: 12/07/2022 12:46   CT HEAD CODE STROKE WO CONTRAST  Result Date: 12/07/2022 CLINICAL DATA:  Code stroke. Neuro deficit, acute, stroke suspected. EXAM: CT HEAD WITHOUT CONTRAST TECHNIQUE: Contiguous axial images were obtained from the base of the skull through the vertex without intravenous contrast. RADIATION DOSE REDUCTION: This exam was performed according to the departmental dose-optimization program which includes automated exposure control, adjustment of the mA and/or kV according to patient size and/or use of iterative reconstruction technique. COMPARISON:  None Available. FINDINGS: Brain: Within limitations of mild motion artifact, there is no evidence of an acute cortically based infarct, intracranial hemorrhage, mass, midline shift, or extra-axial fluid collection. Patchy to confluent hypodensities in the cerebral white matter bilaterally are nonspecific but compatible with moderate chronic small vessel ischemic disease. There is mild cerebral atrophy. Vascular: No hyperdense vessel. Skull: No fracture  or suspicious osseous lesion. Sinuses/Orbits: Small mucous retention cyst in the right  maxillary sinus. Clear mastoid air cells. Unremarkable orbits. Other: None. ASPECTS ([place] Stroke Program Early CT Score) - Ganglionic level infarction (caudate, lentiform nuclei, internal capsule, insula, M1-M3 cortex): 7 - Supraganglionic infarction (M4-M6 cortex): 3 Total score (0-10 with 10 being normal): 10 These results were communicated to Dr. Rory Percy at 12:35 pm on 12/07/2022 by text page via the [facility] messaging system. IMPRESSION: 1. No evidence of acute intracranial abnormality. ASPECTS of 10. 2. Moderate chronic small vessel ischemic disease. Electronically Signed   By: Logan Bores M.D.   On: 12/07/2022 12:35     Subjective: No acute issues or events overnight   Discharge Exam: Vitals:   12/09/22 0328 12/09/22 0630  BP: (!) 149/100 (!) 155/94  Pulse: 88 77  Resp: 19 20  Temp: 98.8 F (37.1 C)   SpO2: 97%    Vitals:   12/08/22 1925 12/08/22 2325 12/09/22 0328 12/09/22 0630  BP: (!) 147/101 (!) 147/83 (!) 149/100 (!) 155/94  Pulse: 99 89 88 77  Resp: '19 20 19 20  '$ Temp: 99.7 F (37.6 C) 98 F (36.7 C) 98.8 F (37.1 C)   TempSrc: Oral Oral Oral   SpO2: 98% 100% 97%   Weight:      Height:        General: Pt is alert, awake, not in acute distress Cardiovascular: RRR, S1/S2 +, no rubs, no gallops Respiratory: CTA bilaterally, no wheezing, no rhonchi Abdominal: Soft, NT, ND, bowel sounds + Extremities: no edema, no cyanosis    The results of significant diagnostics from this hospitalization (including imaging, microbiology, ancillary and laboratory) are listed below for reference.     Microbiology: No results found for this or any previous visit (from the past 240 hour(s)).   Labs: BNP (last 3 results) No results for input(s): "BNP" in the last 8760 hours. Basic Metabolic Panel: Recent Labs  Lab 12/07/22 1221 12/07/22 1228 12/08/22 0249 12/09/22 0234  NA 141 142 141 140  K 3.7 3.1* 2.9* 3.2*  CL 103 106 109 108  CO2  --  '22 22 22  '$ GLUCOSE 116* 123* 118*  109*  BUN 9 7* 6* 13  CREATININE 0.90 1.18 0.89 1.05  CALCIUM  --  9.2 9.4 9.0  MG  --  2.0  --   --    Liver Function Tests: Recent Labs  Lab 12/07/22 1228 12/08/22 0249  AST 28 33  ALT 18 19  ALKPHOS 69 75  BILITOT 1.0 1.3*  PROT 7.1 7.6  ALBUMIN 4.1 4.2   No results for input(s): "LIPASE", "AMYLASE" in the last 168 hours. No results for input(s): "AMMONIA" in the last 168 hours. CBC: Recent Labs  Lab 12/07/22 1221 12/07/22 1228 12/08/22 0249 12/09/22 0234  WBC  --  10.1 13.7* 9.2  NEUTROABS  --  6.4  --   --   HGB 14.3 14.4 14.9 13.1  HCT 42.0 42.0 41.6 39.7  MCV  --  90.7 87.6 91.3  PLT  --  277 285 246   Cardiac Enzymes: No results for input(s): "CKTOTAL", "CKMB", "CKMBINDEX", "TROPONINI" in the last 168 hours. BNP: Invalid input(s): "POCBNP" CBG: Recent Labs  Lab 12/07/22 1216  GLUCAP 112*   D-Dimer No results for input(s): "DDIMER" in the last 72 hours. Hgb A1c No results for input(s): "HGBA1C" in the last 72 hours. Lipid Profile No results for input(s): "CHOL", "HDL", "LDLCALC", "TRIG", "CHOLHDL", "LDLDIRECT" in the last 72  hours. Thyroid function studies No results for input(s): "TSH", "T4TOTAL", "T3FREE", "THYROIDAB" in the last 72 hours.  Invalid input(s): "FREET3" Anemia work up No results for input(s): "VITAMINB12", "FOLATE", "FERRITIN", "TIBC", "IRON", "RETICCTPCT" in the last 72 hours. Urinalysis    Component Value Date/Time   COLORURINE YELLOW 12/07/2022 1701   APPEARANCEUR HAZY (A) 12/07/2022 1701   APPEARANCEUR Clear 11/15/2015 1144   LABSPEC 1.018 12/07/2022 1701   PHURINE 8.0 12/07/2022 1701   GLUCOSEU NEGATIVE 12/07/2022 1701   HGBUR SMALL (A) 12/07/2022 1701   BILIRUBINUR NEGATIVE 12/07/2022 1701   BILIRUBINUR Negative 11/15/2015 1144   KETONESUR NEGATIVE 12/07/2022 1701   PROTEINUR 100 (A) 12/07/2022 1701   NITRITE NEGATIVE 12/07/2022 1701   LEUKOCYTESUR SMALL (A) 12/07/2022 1701   Sepsis Labs Recent Labs  Lab  12/07/22 1228 12/08/22 0249 12/09/22 0234  WBC 10.1 13.7* 9.2   Microbiology No results found for this or any previous visit (from the past 240 hour(s)).   Time coordinating discharge: Over 30 minutes  SIGNED:   Little Ishikawa, DO [facility] 12/09/2022, 7:48 AM Pager   If 7PM-7AM, please contact night-coverage [email or web address]

## 2022-12-09 NOTE — Progress Notes (Signed)
Left arm remains weaker compared to right. Neuro aware. Spouse informed neuro doctor that patient's have become numb and that he has choking spells and forgetfulness.

## 2022-12-09 NOTE — Plan of Care (Signed)
  Problem: Education: Goal: Knowledge of General Education information will improve Description: Including pain rating scale, medication(s)/side effects and non-pharmacologic comfort measures Outcome: Adequate for Discharge   Problem: Health Behavior/Discharge Planning: Goal: Ability to manage health-related needs will improve Outcome: Adequate for Discharge   Problem: Clinical Measurements: Goal: Ability to maintain clinical measurements within normal limits will improve Outcome: Adequate for Discharge Goal: Will remain free from infection Outcome: Adequate for Discharge Goal: Diagnostic test results will improve Outcome: Adequate for Discharge Goal: Respiratory complications will improve Outcome: Adequate for Discharge Goal: Cardiovascular complication will be avoided Outcome: Adequate for Discharge   Problem: Elimination: Goal: Will not experience complications related to urinary retention Outcome: Adequate for Discharge

## 2022-12-11 ENCOUNTER — Telehealth: Payer: Self-pay

## 2022-12-11 NOTE — Telephone Encounter (Signed)
Called and informed patient that medication is OTC. He verbalized his understanding.

## 2022-12-11 NOTE — Telephone Encounter (Signed)
Patient called stating that he was supposed to be prescribed diclofenac 2% gel. I don't see that it was sent to pharmacy.  Message routed to Dr. Alain Honey

## 2022-12-13 ENCOUNTER — Ambulatory Visit: Payer: Federal, State, Local not specified - PPO | Admitting: Orthopedic Surgery

## 2022-12-13 ENCOUNTER — Encounter: Payer: Self-pay | Admitting: Orthopedic Surgery

## 2022-12-13 VITALS — BP 128/78 | HR 95 | Temp 95.5°F | Resp 18 | Ht 73.0 in | Wt 236.0 lb

## 2022-12-13 DIAGNOSIS — R4182 Altered mental status, unspecified: Secondary | ICD-10-CM | POA: Diagnosis not present

## 2022-12-13 DIAGNOSIS — M25512 Pain in left shoulder: Secondary | ICD-10-CM

## 2022-12-13 DIAGNOSIS — I10 Essential (primary) hypertension: Secondary | ICD-10-CM | POA: Diagnosis not present

## 2022-12-13 DIAGNOSIS — M79604 Pain in right leg: Secondary | ICD-10-CM

## 2022-12-13 DIAGNOSIS — F1290 Cannabis use, unspecified, uncomplicated: Secondary | ICD-10-CM | POA: Diagnosis not present

## 2022-12-13 DIAGNOSIS — E876 Hypokalemia: Secondary | ICD-10-CM

## 2022-12-13 LAB — CBC WITH DIFFERENTIAL/PLATELET
Absolute Monocytes: 460 cells/uL (ref 200–950)
Basophils Absolute: 38 cells/uL (ref 0–200)
Basophils Relative: 0.6 %
Eosinophils Absolute: 447 cells/uL (ref 15–500)
Eosinophils Relative: 7.1 %
HCT: 40.6 % (ref 38.5–50.0)
Hemoglobin: 13.9 g/dL (ref 13.2–17.1)
Lymphs Abs: 2098 cells/uL (ref 850–3900)
MCH: 31 pg (ref 27.0–33.0)
MCHC: 34.2 g/dL (ref 32.0–36.0)
MCV: 90.4 fL (ref 80.0–100.0)
MPV: 9.9 fL (ref 7.5–12.5)
Monocytes Relative: 7.3 %
Neutro Abs: 3257 cells/uL (ref 1500–7800)
Neutrophils Relative %: 51.7 %
Platelets: 294 10*3/uL (ref 140–400)
RBC: 4.49 10*6/uL (ref 4.20–5.80)
RDW: 13.4 % (ref 11.0–15.0)
Total Lymphocyte: 33.3 %
WBC: 6.3 10*3/uL (ref 3.8–10.8)

## 2022-12-13 LAB — BASIC METABOLIC PANEL
BUN: 10 mg/dL (ref 7–25)
CO2: 29 mmol/L (ref 20–32)
Calcium: 9.6 mg/dL (ref 8.6–10.3)
Chloride: 102 mmol/L (ref 98–110)
Creat: 0.82 mg/dL (ref 0.70–1.28)
Glucose, Bld: 92 mg/dL (ref 65–99)
Potassium: 3.8 mmol/L (ref 3.5–5.3)
Sodium: 141 mmol/L (ref 135–146)

## 2022-12-13 NOTE — Patient Instructions (Signed)
Please take tylenol [phone] mg twice daily x 7 days to help with left shoulder pain and right leg pain  Please purchase Voltaren gel 2%- at local store> apply to left shoulder and right leg 3-4 time/day  Continue using heating pad  Will call with lab results tomorrow

## 2022-12-13 NOTE — Progress Notes (Signed)
Careteam: Patient Care Team: Wardell Honour, MD as PCP - General (Family Medicine)  Seen by: Windell Moulding, AGNP-C  PLACE OF SERVICE:  [place] Directive information    No Known Allergies  Chief Complaint  Patient presents with   Hospitalization Follow-up    Patient is here to be seen in office as follow-up from the hospital on 12/07/2022.     HPI: Patient is a 77 y.o. male seen today for f/u s/p hospitalization 12/29-12/31.   Admitting diagnosis acute metabolic encephalopathy. EEG unremarkable and seizure ruled out. Neurology consult did not recommend antiepileptics.UDS was positive for THC and benzos. He denied substance abuse during stay. K+ 2.9, he was given oral potassium. K+ 3.2 at discharge. Heart failure questioned during stay due to prescribed medications. No echocardiogram in notes.   Right leg pain and left shoulder pain prior to hospitalization. He was using voltaren get, requesting 3% prescription. Also using heating pad. Not using tylenol regularly.   Review of Systems:  Review of Systems  Constitutional: Negative.   HENT: Negative.    Eyes: Negative.   Respiratory:  Negative for cough, shortness of breath and wheezing.   Cardiovascular:  Positive for leg swelling. Negative for chest pain and palpitations.  Gastrointestinal: Negative.   Genitourinary: Negative.   Musculoskeletal:  Positive for joint pain and myalgias. Negative for falls.  Skin: Negative.   Neurological: Negative.   Psychiatric/Behavioral: Negative.      Past Medical History:  Diagnosis Date   Arthritis    Asthma    Colon polyps    Gout    Hay fever    Hyperlipidemia    Hypertension    Positive TB test    Urinary retention with incomplete bladder emptying    Past Surgical History:  Procedure Laterality Date   COLONOSCOPY     HERNIA REPAIR  1998   TOOTH EXTRACTION     WRIST SURGERY  1964   Social History:   reports that he has been smoking cigars. He has never  used smokeless tobacco. He reports that he does not currently use alcohol. He reports current drug use. Drug: Marijuana.  Family History  Problem Relation Age of Onset   Arthritis Mother    Hyperlipidemia Mother    Heart disease Mother    Hypertension Mother    Uterine cancer Mother    Arthritis Father    Hyperlipidemia Father    Heart disease Father    Hypertension Father    Tuberculosis Father    Hyperlipidemia Sister    Hypertension Sister    Heart disease Sister    Arthritis Maternal Grandmother    Arthritis Maternal Grandfather    Arthritis Paternal Grandmother    Arthritis Paternal Grandfather    Prostate cancer Neg Hx     Medications: Patient's Medications  New Prescriptions   No medications on file  Previous Medications   ACETAMINOPHEN (TYLENOL) 500 MG TABLET    Take 500 mg by mouth every 6 (six) hours as needed for mild pain.   ALBUTEROL (VENTOLIN HFA) 108 (90 BASE) MCG/ACT INHALER    Inhale 1 puff into the lungs every 6 (six) hours as needed for wheezing or shortness of breath.   ALLOPURINOL (ZYLOPRIM) 300 MG TABLET    TAKE 1 TABLET BY MOUTH EVERY DAY   AMLODIPINE (NORVASC) 10 MG TABLET    Take 10 mg by mouth daily.   FINASTERIDE (PROSCAR) 5 MG TABLET    Take 5 mg by  mouth daily.   FUROSEMIDE (LASIX) 20 MG TABLET    Take 2 tablets (40 mg total) by mouth daily.   MINOCYCLINE (MINOCIN) 100 MG CAPSULE    TAKE 1 CAPSULE (100 MG TOTAL) BY MOUTH DAILY AS NEEDED (URINARY PROBLEMS).   MULTIPLE VITAMIN (MULTIVITAMIN) TABLET    Take 1 tablet by mouth every other day.   POTASSIUM CHLORIDE 20 MEQ/15ML (10%) SOLN    TAKE 15 MLS (20 MEQ TOTAL) BY MOUTH EVERY OTHER DAY.   ROSUVASTATIN (CRESTOR) 10 MG TABLET    Take 1 tablet (10 mg total) by mouth daily.   SILDENAFIL (VIAGRA) 100 MG TABLET    Take 1 tablet (100 mg total) by mouth daily as needed.   VALSARTAN-HYDROCHLOROTHIAZIDE (DIOVAN-HCT) 320-25 MG TABLET    TAKE 1 TABLET BY MOUTH EVERY DAY  Modified Medications   No medications  on file  Discontinued Medications   No medications on file    Physical Exam:  There were no vitals filed for this visit. There is no height or weight on file to calculate BMI. Wt Readings from Last 3 Encounters:  12/07/22 213 lb 3 oz (96.7 kg)  11/08/22 236 lb 12.8 oz (107.4 kg)  05/01/22 236 lb 3.2 oz (107.1 kg)    Physical Exam Vitals reviewed.  Constitutional:      General: He is not in acute distress. HENT:     Head: Normocephalic.  Eyes:     General:        Right eye: No discharge.        Left eye: No discharge.  Cardiovascular:     Rate and Rhythm: Normal rate and regular rhythm.     Pulses: Normal pulses.     Heart sounds: Normal heart sounds.  Pulmonary:     Effort: Pulmonary effort is normal. No respiratory distress.     Breath sounds: Normal breath sounds. No wheezing.  Abdominal:     General: Bowel sounds are normal. There is no distension.     Palpations: Abdomen is soft.     Tenderness: There is no abdominal tenderness.  Musculoskeletal:     Left shoulder: Tenderness present. No swelling, deformity or crepitus. Normal range of motion. Normal strength.     Cervical back: Neck supple.     Right lower leg: Tenderness present. No swelling.     Left lower leg: No edema.     Comments: Mild pain to right posterior leg/ piriformis  Skin:    General: Skin is warm and dry.     Capillary Refill: Capillary refill takes less than 2 seconds.  Neurological:     General: No focal deficit present.     Mental Status: He is alert and oriented to person, place, and time.  Psychiatric:        Mood and Affect: Mood normal.        Behavior: Behavior normal.     Labs reviewed: Basic Metabolic Panel: Recent Labs    12/07/22 1228 12/08/22 0249 12/09/22 0234  NA 142 141 140  K 3.1* 2.9* 3.2*  CL 106 109 108  CO2 '22 22 22  '$ GLUCOSE 123* 118* 109*  BUN 7* 6* 13  CREATININE 1.18 0.89 1.05  CALCIUM 9.2 9.4 9.0  MG 2.0  --   --    Liver Function Tests: Recent Labs     11/08/22 0945 12/07/22 1228 12/08/22 0249  AST 15 28 33  ALT '16 18 19  '$ ALKPHOS  --  69 75  BILITOT  1.0 1.0 1.3*  PROT 7.3 7.1 7.6  ALBUMIN  --  4.1 4.2   No results for input(s): "LIPASE", "AMYLASE" in the last 8760 hours. No results for input(s): "AMMONIA" in the last 8760 hours. CBC: Recent Labs    12/07/22 1228 12/08/22 0249 12/09/22 0234  WBC 10.1 13.7* 9.2  NEUTROABS 6.4  --   --   HGB 14.4 14.9 13.1  HCT 42.0 41.6 39.7  MCV 90.7 87.6 91.3  PLT 277 285 246   Lipid Panel: Recent Labs    11/08/22 0945  CHOL 169  HDL 42  LDLCALC 99  TRIG 182*  CHOLHDL 4.0   TSH: No results for input(s): "TSH" in the last 8760 hours. A1C: No results found for: "HGBA1C"   Assessment/Plan 1. Hypokalemia - K+ 3.2 at discharge - asymptomatic - will recheck today - Basic Metabolic Panel  2. Altered mental status, unspecified altered mental status type - resolved - hospitalized 12/29-12/31 - EEG unremarkable, stroke ruled out - CBC with Differential/Platelet  3. Marijuana use - UDS + THC during hospitalization> denied use  4. Primary hypertension - controlled - cont amlodipine  5. Acute pain of left shoulder - FROM - suspect muscle tightness - recommend voltaren gel- 3-4x/daily  - continue moving - recommend scheduled tylenol [phone] mg BID x 7 days  6. Right leg pain - FROM, tenderness to right piriformis - see above  Total time: 28 minutes. Greater than 50% of total time spent doing patient education regarding hospital follow up including AMS, symptom/medication management and shoulder/leg pain interventions.     Next appt: Visit date not found  [place], [facility] [phone]

## 2022-12-14 ENCOUNTER — Other Ambulatory Visit: Payer: Self-pay | Admitting: Orthopedic Surgery

## 2022-12-14 DIAGNOSIS — E876 Hypokalemia: Secondary | ICD-10-CM

## 2022-12-14 MED ORDER — POTASSIUM CHLORIDE CRYS ER 20 MEQ PO TBCR: 20.0000 meq | EXTENDED_RELEASE_TABLET | ORAL | 1 refills | Status: DC

## 2022-12-14 NOTE — Progress Notes (Signed)
New prescription for potassium tablets sent to [facility] on [address].

## 2022-12-19 ENCOUNTER — Other Ambulatory Visit: Payer: Self-pay | Admitting: Family Medicine

## 2022-12-19 NOTE — Addendum Note (Signed)
Addended byWindell Moulding E on: 12/19/2022 12:57 PM   Modules accepted: Level of Service

## 2022-12-19 NOTE — Telephone Encounter (Signed)
Patient has request refill on medication Allopurinol '300mg'$ .

## 2022-12-20 NOTE — Addendum Note (Signed)
Addended byWindell Moulding E on: 12/20/2022 04:13 PM   Modules accepted: Level of Service

## 2023-02-12 ENCOUNTER — Other Ambulatory Visit: Payer: Self-pay | Admitting: Adult Health

## 2023-02-12 DIAGNOSIS — R972 Elevated prostate specific antigen [PSA]: Secondary | ICD-10-CM

## 2023-02-28 ENCOUNTER — Other Ambulatory Visit: Payer: Self-pay

## 2023-02-28 ENCOUNTER — Emergency Department (HOSPITAL_COMMUNITY): Payer: Medicare Other

## 2023-02-28 ENCOUNTER — Inpatient Hospital Stay (HOSPITAL_COMMUNITY)
Admission: EM | Admit: 2023-02-28 | Discharge: 2023-03-03 | DRG: 101 | Disposition: A | Discharge status: Home / Self Care | Payer: Medicare Other | Attending: Internal Medicine | Admitting: Internal Medicine

## 2023-02-28 ENCOUNTER — Encounter (HOSPITAL_COMMUNITY): Payer: Self-pay | Admitting: Internal Medicine

## 2023-02-28 DIAGNOSIS — F05 Delirium due to known physiological condition: Secondary | ICD-10-CM | POA: Diagnosis present

## 2023-02-28 DIAGNOSIS — Z83438 Family history of other disorder of lipoprotein metabolism and other lipidemia: Secondary | ICD-10-CM

## 2023-02-28 DIAGNOSIS — R000 Tachycardia, unspecified: Secondary | ICD-10-CM | POA: Diagnosis present

## 2023-02-28 DIAGNOSIS — Z8049 Family history of malignant neoplasm of other genital organs: Secondary | ICD-10-CM

## 2023-02-28 DIAGNOSIS — G44209 Tension-type headache, unspecified, not intractable: Secondary | ICD-10-CM | POA: Diagnosis present

## 2023-02-28 DIAGNOSIS — E876 Hypokalemia: Secondary | ICD-10-CM | POA: Diagnosis present

## 2023-02-28 DIAGNOSIS — R32 Unspecified urinary incontinence: Secondary | ICD-10-CM | POA: Diagnosis present

## 2023-02-28 DIAGNOSIS — F17290 Nicotine dependence, other tobacco product, uncomplicated: Secondary | ICD-10-CM | POA: Diagnosis present

## 2023-02-28 DIAGNOSIS — G40909 Epilepsy, unspecified, not intractable, without status epilepticus: Principal | ICD-10-CM | POA: Diagnosis present

## 2023-02-28 DIAGNOSIS — N401 Enlarged prostate with lower urinary tract symptoms: Secondary | ICD-10-CM | POA: Diagnosis present

## 2023-02-28 DIAGNOSIS — E785 Hyperlipidemia, unspecified: Secondary | ICD-10-CM | POA: Diagnosis present

## 2023-02-28 DIAGNOSIS — G8384 Todd's paralysis (postepileptic): Secondary | ICD-10-CM | POA: Diagnosis present

## 2023-02-28 DIAGNOSIS — Z86010 Personal history of colonic polyps: Secondary | ICD-10-CM | POA: Diagnosis not present

## 2023-02-28 DIAGNOSIS — Z831 Family history of other infectious and parasitic diseases: Secondary | ICD-10-CM

## 2023-02-28 DIAGNOSIS — I161 Hypertensive emergency: Secondary | ICD-10-CM | POA: Diagnosis present

## 2023-02-28 DIAGNOSIS — Z8261 Family history of arthritis: Secondary | ICD-10-CM

## 2023-02-28 DIAGNOSIS — R609 Edema, unspecified: Secondary | ICD-10-CM

## 2023-02-28 DIAGNOSIS — R569 Unspecified convulsions: Secondary | ICD-10-CM | POA: Diagnosis present

## 2023-02-28 DIAGNOSIS — I10 Essential (primary) hypertension: Secondary | ICD-10-CM

## 2023-02-28 DIAGNOSIS — R338 Other retention of urine: Secondary | ICD-10-CM | POA: Diagnosis present

## 2023-02-28 DIAGNOSIS — Z79899 Other long term (current) drug therapy: Secondary | ICD-10-CM | POA: Diagnosis not present

## 2023-02-28 DIAGNOSIS — Z8249 Family history of ischemic heart disease and other diseases of the circulatory system: Secondary | ICD-10-CM | POA: Diagnosis not present

## 2023-02-28 DIAGNOSIS — I639 Cerebral infarction, unspecified: Secondary | ICD-10-CM | POA: Diagnosis present

## 2023-02-28 LAB — I-STAT CHEM 8, ED
BUN: 8 mg/dL (ref 8–23)
Calcium, Ion: 1.11 mmol/L — ABNORMAL LOW (ref 1.15–1.40)
Chloride: 102 mmol/L (ref 98–111)
Creatinine, Ser: 0.9 mg/dL (ref 0.61–1.24)
Glucose, Bld: 126 mg/dL — ABNORMAL HIGH (ref 70–99)
HCT: 46 % (ref 39.0–52.0)
Hemoglobin: 15.6 g/dL (ref 13.0–17.0)
Potassium: 2.7 mmol/L — CL (ref 3.5–5.1)
Sodium: 143 mmol/L (ref 135–145)
TCO2: 23 mmol/L (ref 22–32)

## 2023-02-28 LAB — DIFFERENTIAL
Abs Immature Granulocytes: 0.05 10*3/uL (ref 0.00–0.07)
Basophils Absolute: 0.1 10*3/uL (ref 0.0–0.1)
Basophils Relative: 0 %
Eosinophils Absolute: 0.5 10*3/uL (ref 0.0–0.5)
Eosinophils Relative: 5 %
Immature Granulocytes: 0 %
Lymphocytes Relative: 33 %
Lymphs Abs: 3.7 10*3/uL (ref 0.7–4.0)
Monocytes Absolute: 0.6 10*3/uL (ref 0.1–1.0)
Monocytes Relative: 5 %
Neutro Abs: 6.4 10*3/uL (ref 1.7–7.7)
Neutrophils Relative %: 57 %

## 2023-02-28 LAB — CBC
HCT: 45.7 % (ref 39.0–52.0)
Hemoglobin: 15.6 g/dL (ref 13.0–17.0)
MCH: 31.1 pg (ref 26.0–34.0)
MCHC: 34.1 g/dL (ref 30.0–36.0)
MCV: 91.2 fL (ref 80.0–100.0)
Platelets: 297 10*3/uL (ref 150–400)
RBC: 5.01 MIL/uL (ref 4.22–5.81)
RDW: 13.8 % (ref 11.5–15.5)
WBC: 11.2 10*3/uL — ABNORMAL HIGH (ref 4.0–10.5)
nRBC: 0 % (ref 0.0–0.2)

## 2023-02-28 LAB — PROTIME-INR
INR: 1 (ref 0.8–1.2)
Prothrombin Time: 12.8 seconds (ref 11.4–15.2)

## 2023-02-28 LAB — URINALYSIS, ROUTINE W REFLEX MICROSCOPIC
Bacteria, UA: NONE SEEN
Bilirubin Urine: NEGATIVE
Glucose, UA: NEGATIVE mg/dL
Ketones, ur: NEGATIVE mg/dL
Leukocytes,Ua: NEGATIVE
Nitrite: NEGATIVE
Protein, ur: >=300 mg/dL — AB
Specific Gravity, Urine: 1.009 (ref 1.005–1.030)
pH: 7 (ref 5.0–8.0)

## 2023-02-28 LAB — COMPREHENSIVE METABOLIC PANEL
ALT: 18 U/L (ref 0–44)
AST: 32 U/L (ref 15–41)
Albumin: 4.4 g/dL (ref 3.5–5.0)
Alkaline Phosphatase: 83 U/L (ref 38–126)
Anion gap: 17 — ABNORMAL HIGH (ref 5–15)
BUN: 7 mg/dL — ABNORMAL LOW (ref 8–23)
CO2: 20 mmol/L — ABNORMAL LOW (ref 22–32)
Calcium: 9.1 mg/dL (ref 8.9–10.3)
Chloride: 103 mmol/L (ref 98–111)
Creatinine, Ser: 1.01 mg/dL (ref 0.61–1.24)
GFR, Estimated: >60 mL/min (ref >60)
Glucose, Bld: 129 mg/dL — ABNORMAL HIGH (ref 70–99)
Potassium: 3 mmol/L — ABNORMAL LOW (ref 3.5–5.1)
Sodium: 140 mmol/L (ref 135–145)
Total Bilirubin: 1 mg/dL (ref 0.3–1.2)
Total Protein: 8.1 g/dL (ref 6.5–8.1)

## 2023-02-28 LAB — RAPID URINE DRUG SCREEN, HOSP PERFORMED
Amphetamines: NOT DETECTED
Barbiturates: NOT DETECTED
Benzodiazepines: NOT DETECTED
Cocaine: NOT DETECTED
Opiates: POSITIVE — AB
Tetrahydrocannabinol: POSITIVE — AB

## 2023-02-28 LAB — APTT: aPTT: 25 seconds (ref 24–36)

## 2023-02-28 LAB — CBG MONITORING, ED: Glucose-Capillary: 127 mg/dL — ABNORMAL HIGH (ref 70–99)

## 2023-02-28 LAB — ETHANOL: Alcohol, Ethyl (B): <10 mg/dL (ref <10)

## 2023-02-28 MED ORDER — ENOXAPARIN SODIUM 40 MG/0.4ML IJ SOSY
40.0000 mg | PREFILLED_SYRINGE | INTRAMUSCULAR | Status: DC
  Administered 2023-02-28 – 2023-03-02 (×3): 40 mg via SUBCUTANEOUS
  Filled 2023-02-28 (×3): qty 0.4

## 2023-02-28 MED ORDER — ROSUVASTATIN CALCIUM 5 MG PO TABS
10.0000 mg | ORAL_TABLET | Freq: Every day | ORAL | Status: DC
  Administered 2023-02-28 – 2023-03-03 (×4): 10 mg via ORAL
  Filled 2023-02-28 (×4): qty 2

## 2023-02-28 MED ORDER — MIDAZOLAM HCL 2 MG/2ML IJ SOLN
INTRAMUSCULAR | Status: AC
  Filled 2023-02-28: qty 2

## 2023-02-28 MED ORDER — LABETALOL HCL 5 MG/ML IV SOLN: 20.0000 mg | INTRAVENOUS | Status: DC | PRN

## 2023-02-28 MED ORDER — ACETAMINOPHEN 650 MG RE SUPP: 650.0000 mg | Freq: Four times a day (QID) | RECTAL | Status: DC | PRN

## 2023-02-28 MED ORDER — AMLODIPINE BESYLATE 5 MG PO TABS
10.0000 mg | ORAL_TABLET | Freq: Every day | ORAL | Status: DC
  Administered 2023-02-28 – 2023-03-03 (×4): 10 mg via ORAL
  Filled 2023-02-28 (×4): qty 2

## 2023-02-28 MED ORDER — ADULT MULTIVITAMIN W/MINERALS CH
1.0000 | ORAL_TABLET | Freq: Every day | ORAL | Status: DC
  Administered 2023-03-01 – 2023-03-03 (×3): 1 via ORAL
  Filled 2023-02-28 (×4): qty 1

## 2023-02-28 MED ORDER — POTASSIUM CHLORIDE 20 MEQ PO PACK
40.0000 meq | PACK | Freq: Once | ORAL | Status: AC
  Administered 2023-02-28: 40 meq via ORAL
  Filled 2023-02-28: qty 2

## 2023-02-28 MED ORDER — POLYETHYLENE GLYCOL 3350 17 G PO PACK: 17.0000 g | PACK | Freq: Every day | ORAL | Status: DC | PRN

## 2023-02-28 MED ORDER — LORAZEPAM 2 MG/ML IJ SOLN: 2.0000 mg | Freq: Four times a day (QID) | INTRAMUSCULAR | Status: DC | PRN

## 2023-02-28 MED ORDER — SODIUM CHLORIDE 0.9 % IV SOLN
1.0000 mg | Freq: Once | INTRAVENOUS | Status: AC
  Administered 2023-02-28: 1 mg via INTRAVENOUS
  Filled 2023-02-28: qty 0.2

## 2023-02-28 MED ORDER — HYDROCODONE-ACETAMINOPHEN 5-325 MG PO TABS
1.0000 | ORAL_TABLET | ORAL | Status: DC | PRN
  Administered 2023-03-02: 2 via ORAL
  Filled 2023-02-28: qty 2

## 2023-02-28 MED ORDER — LORAZEPAM 2 MG/ML IJ SOLN: 0.0000 mg | Freq: Two times a day (BID) | INTRAMUSCULAR | Status: DC

## 2023-02-28 MED ORDER — ONDANSETRON HCL 4 MG/2ML IJ SOLN
INTRAMUSCULAR | Status: AC
  Administered 2023-02-28: 4 mg
  Filled 2023-02-28: qty 2

## 2023-02-28 MED ORDER — THIAMINE MONONITRATE 100 MG PO TABS
100.0000 mg | ORAL_TABLET | Freq: Every day | ORAL | Status: DC
  Administered 2023-03-01 – 2023-03-03 (×3): 100 mg via ORAL
  Filled 2023-02-28 (×4): qty 1

## 2023-02-28 MED ORDER — ACETAMINOPHEN 325 MG PO TABS: 650.0000 mg | ORAL_TABLET | Freq: Four times a day (QID) | ORAL | Status: DC | PRN

## 2023-02-28 MED ORDER — IRBESARTAN 300 MG PO TABS
300.0000 mg | ORAL_TABLET | Freq: Every day | ORAL | Status: DC
  Administered 2023-02-28 – 2023-03-03 (×4): 300 mg via ORAL
  Filled 2023-02-28 (×4): qty 1

## 2023-02-28 MED ORDER — LABETALOL HCL 5 MG/ML IV SOLN
20.0000 mg | Freq: Once | INTRAVENOUS | Status: AC
  Administered 2023-02-28: 20 mg via INTRAVENOUS
  Filled 2023-02-28: qty 4

## 2023-02-28 MED ORDER — CLEVIDIPINE BUTYRATE 0.5 MG/ML IV EMUL: 0.0000 mg/h | INTRAVENOUS | Status: DC

## 2023-02-28 MED ORDER — FUROSEMIDE 20 MG PO TABS
40.0000 mg | ORAL_TABLET | Freq: Every day | ORAL | Status: DC
  Administered 2023-02-28 – 2023-03-01 (×2): 40 mg via ORAL
  Filled 2023-02-28 (×2): qty 2

## 2023-02-28 MED ORDER — LEVETIRACETAM IN NACL 500 MG/100ML IV SOLN
500.0000 mg | Freq: Two times a day (BID) | INTRAVENOUS | Status: DC
  Administered 2023-02-28 – 2023-03-01 (×3): 500 mg via INTRAVENOUS
  Filled 2023-02-28 (×3): qty 100

## 2023-02-28 MED ORDER — HYDROCHLOROTHIAZIDE 25 MG PO TABS
25.0000 mg | ORAL_TABLET | Freq: Every day | ORAL | Status: DC
  Administered 2023-02-28 – 2023-03-01 (×2): 25 mg via ORAL
  Filled 2023-02-28 (×2): qty 1

## 2023-02-28 MED ORDER — ONDANSETRON HCL 4 MG/2ML IJ SOLN: 4.0000 mg | Freq: Four times a day (QID) | INTRAMUSCULAR | Status: DC | PRN

## 2023-02-28 MED ORDER — THIAMINE HCL 100 MG/ML IJ SOLN
100.0000 mg | Freq: Every day | INTRAMUSCULAR | Status: DC
  Administered 2023-02-28: 100 mg via INTRAVENOUS
  Filled 2023-02-28: qty 2

## 2023-02-28 MED ORDER — FOLIC ACID 1 MG PO TABS
1.0000 mg | ORAL_TABLET | Freq: Every day | ORAL | Status: DC
  Administered 2023-03-01 – 2023-03-03 (×3): 1 mg via ORAL
  Filled 2023-02-28 (×3): qty 1

## 2023-02-28 MED ORDER — LORAZEPAM 2 MG/ML IJ SOLN: 0.0000 mg | Freq: Four times a day (QID) | INTRAMUSCULAR | Status: DC

## 2023-02-28 MED ORDER — VALSARTAN-HYDROCHLOROTHIAZIDE 320-25 MG PO TABS: 1.0000 | ORAL_TABLET | Freq: Every day | ORAL | Status: DC

## 2023-02-28 MED ORDER — ONDANSETRON HCL 4 MG PO TABS: 4.0000 mg | ORAL_TABLET | Freq: Four times a day (QID) | ORAL | Status: DC | PRN

## 2023-02-28 MED ORDER — HYDRALAZINE HCL 20 MG/ML IJ SOLN
10.0000 mg | INTRAMUSCULAR | Status: DC | PRN
  Administered 2023-02-28: 10 mg via INTRAVENOUS
  Filled 2023-02-28: qty 1

## 2023-02-28 MED ORDER — FINASTERIDE 5 MG PO TABS
5.0000 mg | ORAL_TABLET | Freq: Every day | ORAL | Status: DC
  Administered 2023-02-28 – 2023-03-03 (×4): 5 mg via ORAL
  Filled 2023-02-28 (×4): qty 1

## 2023-02-28 MED ORDER — LEVETIRACETAM IN NACL 1500 MG/100ML IV SOLN
3000.0000 mg | INTRAVENOUS | Status: AC
  Administered 2023-02-28: 3000 mg via INTRAVENOUS
  Filled 2023-02-28: qty 200

## 2023-02-28 MED ORDER — POTASSIUM CHLORIDE CRYS ER 20 MEQ PO TBCR
40.0000 meq | EXTENDED_RELEASE_TABLET | Freq: Once | ORAL | Status: AC
  Administered 2023-02-28: 40 meq via ORAL
  Filled 2023-02-28: qty 2

## 2023-02-28 MED ORDER — FOLIC ACID 1 MG PO TABS: 1.0000 mg | ORAL_TABLET | Freq: Every day | ORAL | Status: DC

## 2023-02-28 MED ORDER — MORPHINE SULFATE (PF) 4 MG/ML IV SOLN
4.0000 mg | Freq: Once | INTRAVENOUS | Status: AC
  Administered 2023-02-28: 4 mg via INTRAVENOUS
  Filled 2023-02-28: qty 1

## 2023-02-28 NOTE — ED Notes (Signed)
ED TO INPATIENT HANDOFF REPORT  ED Nurse Name and Phone #: [phone]  S Name/Age/Gender Edward Wolf 77 y.o. male Room/Bed: TRABC/TRABC  Code Status   Code Status: Full Code  Home/SNF/Other Home Patient oriented to: self, place, and situation Is this baseline? No   Triage Complete: Triage complete  Chief Complaint Hypertensive emergency [I16.1] CVA (cerebral vascular accident) [facility]) [I63.9]  Triage Note Pt arrived to ED via EMS from home after pt was witnessed having a full body seizure. Pt LKW 1300 when he took a nap. When he woke up pt was c/o headache then had the seizure. Hx of HTN, no hx of sz or cva. Pt postictal through most of transport and then became alert and confused. Initial BP 270/110. Last BP 205/139, CBG 116, HR 120 ST. 18g L FA.   Allergies No Known Allergies  Level of Care/Admitting Diagnosis ED Disposition     ED Disposition  Admit   Condition  --   Comment  Hospital Area: [facility] [[id number]]  Level of Care: Progressive [102]  Admit to Progressive based on following criteria: NEUROLOGICAL AND NEUROSURGICAL complex patients with significant risk of instability, who do not meet ICU criteria, yet require close observation or frequent assessment (< / = every 2 - 4 hours) with medical / nursing intervention.  May admit patient to [facility] or [facility] if equivalent level of care is available:: Yes  Covid Evaluation: Asymptomatic - no recent exposure (last 10 days) testing not required  Diagnosis: CVA (cerebral vascular accident) [facility]) [id number]  Admitting Physician: Emilee Hero [id number]  Attending Physician: Emilee Hero [id number]  Certification:: I certify this patient will need inpatient services for at least 2 midnights          B Medical/Surgery History Past Medical History:  Diagnosis Date   Arthritis    Asthma    Colon polyps    Gout    Hay fever    Hyperlipidemia    Hypertension    Positive TB test     Urinary retention with incomplete bladder emptying    Past Surgical History:  Procedure Laterality Date   COLONOSCOPY     [place]     A IV Location/Drains/Wounds Patient Lines/Drains/Airways Status     Active Line/Drains/Airways     Name Placement date Placement time Site Days   Peripheral IV 02/28/23 18 G Anterior;Left Forearm 02/28/23  1521  Forearm  less than 1   Peripheral IV 02/28/23 18 G Right Antecubital 02/28/23  1605  Antecubital  less than 1   Urethral Catheter Tiasia Weberg RN Straight-tip 16 Fr. 02/28/23  1725  Straight-tip  less than 1            Intake/Output Last 24 hours  Intake/Output Summary (Last 24 hours) at 02/28/2023 1947 Last data filed at 02/28/2023 1747 Gross per 24 hour  Intake 47.21 ml  Output --  Net 47.21 ml    Labs/Imaging Results for orders placed or performed during the hospital encounter of 02/28/23 (from the past 48 hour(s))  CBG monitoring, ED     Status: Abnormal   Collection Time: 02/28/23  3:23 PM  Result Value Ref Range   Glucose-Capillary 127 (H) 70 - 99 mg/dL    Comment: Glucose reference range applies only to samples taken after fasting for at least 8 hours.  Ethanol     Status: None   Collection Time: 02/28/23  3:26 PM  Result Value Ref Range   Alcohol, Ethyl (B) <10 <10 mg/dL    Comment: (NOTE) Lowest detectable limit for serum alcohol is 10 mg/dL.  For medical purposes only. Performed at [facility], [place] [address] [place], [place] [address]   Protime-INR     Status: None   Collection Time: 02/28/23  3:26 PM  Result Value Ref Range   Prothrombin Time 12.8 11.4 - 15.2 seconds   INR 1.0 0.8 - 1.2    Comment: (NOTE) INR goal varies based on device and disease states. Performed at [facility], [place] [address] [place], [place] [address]   APTT     Status: None   Collection Time: 02/28/23  3:26 PM  Result Value Ref Range   aPTT 25 24 - 36 seconds     Comment: Performed at [facility] [address], [place], [place] [address]  CBC     Status: Abnormal   Collection Time: 02/28/23  3:26 PM  Result Value Ref Range   WBC 11.2 (H) 4.0 - 10.5 K/uL   RBC 5.01 4.22 - 5.81 MIL/uL   Hemoglobin 15.6 13.0 - 17.0 g/dL   HCT 45.7 39.0 - 52.0 %   MCV 91.2 80.0 - 100.0 fL   MCH 31.1 26.0 - 34.0 pg   MCHC 34.1 30.0 - 36.0 g/dL   RDW 13.8 11.5 - 15.5 %   Platelets 297 150 - 400 K/uL   nRBC 0.0 0.0 - 0.2 %    Comment: Performed at [facility], [place] [address] [place], [place] [address]  Differential     Status: None   Collection Time: 02/28/23  3:26 PM  Result Value Ref Range   Neutrophils Relative % 57 %   Neutro Abs 6.4 1.7 - 7.7 K/uL   Lymphocytes Relative 33 %   Lymphs Abs 3.7 0.7 - 4.0 K/uL   Monocytes Relative 5 %   Monocytes Absolute 0.6 0.1 - 1.0 K/uL   Eosinophils Relative 5 %   Eosinophils Absolute 0.5 0.0 - 0.5 K/uL   Basophils Relative 0 %   Basophils Absolute 0.1 0.0 - 0.1 K/uL   Immature Granulocytes 0 %   Abs Immature Granulocytes 0.05 0.00 - 0.07 K/uL    Comment: Performed at [facility] [address], [place], [place] [address]  Comprehensive metabolic panel     Status: Abnormal   Collection Time: 02/28/23  3:26 PM  Result Value Ref Range   Sodium 140 135 - 145 mmol/L   Potassium 3.0 (L) 3.5 - 5.1 mmol/L   Chloride 103 98 - 111 mmol/L   CO2 20 (L) 22 - 32 mmol/L   Glucose, Bld 129 (H) 70 - 99 mg/dL    Comment: Glucose reference range applies only to samples taken after fasting for at least 8 hours.   BUN 7 (L) 8 - 23 mg/dL   Creatinine, Ser 1.01 0.61 - 1.24 mg/dL   Calcium 9.1 8.9 - 10.3 mg/dL   Total Protein 8.1 6.5 - 8.1 g/dL   Albumin 4.4 3.5 - 5.0 g/dL   AST 32 15 - 41 U/L   ALT 18 0 - 44 U/L   Alkaline Phosphatase 83 38 - 126 U/L   Total Bilirubin 1.0 0.3 - 1.2 mg/dL   GFR, Estimated >60 >60 mL/min    Comment: (NOTE) Calculated using the CKD-EPI Creatinine Equation (2021)    Anion  gap 17 (H) 5 -  15    Comment: Performed at [facility], [place] [address], [place], [place] [address]  I-stat chem 8, ED     Status: Abnormal   Collection Time: 02/28/23  4:10 PM  Result Value Ref Range   Sodium 143 135 - 145 mmol/L   Potassium 2.7 (LL) 3.5 - 5.1 mmol/L   Chloride 102 98 - 111 mmol/L   BUN 8 8 - 23 mg/dL   Creatinine, Ser 0.90 0.61 - 1.24 mg/dL   Glucose, Bld 126 (H) 70 - 99 mg/dL    Comment: Glucose reference range applies only to samples taken after fasting for at least 8 hours.   Calcium, Ion 1.11 (L) 1.15 - 1.40 mmol/L   TCO2 23 22 - 32 mmol/L   Hemoglobin 15.6 13.0 - 17.0 g/dL   HCT 46.0 39.0 - 52.0 %   Comment NOTIFIED PHYSICIAN   Urine rapid drug screen (hosp performed)     Status: Abnormal   Collection Time: 02/28/23  5:00 PM  Result Value Ref Range   Opiates POSITIVE (A) NONE DETECTED   Cocaine NONE DETECTED NONE DETECTED   Benzodiazepines NONE DETECTED NONE DETECTED   Amphetamines NONE DETECTED NONE DETECTED   Tetrahydrocannabinol POSITIVE (A) NONE DETECTED   Barbiturates NONE DETECTED NONE DETECTED    Comment: (NOTE) DRUG SCREEN FOR MEDICAL PURPOSES ONLY.  IF CONFIRMATION IS NEEDED FOR ANY PURPOSE, NOTIFY LAB WITHIN 5 DAYS.  LOWEST DETECTABLE LIMITS FOR URINE DRUG SCREEN Drug Class                     Cutoff (ng/mL) Amphetamine and metabolites    1000 Barbiturate and metabolites    200 Benzodiazepine                 200 Opiates and metabolites        300 Cocaine and metabolites        300 THC                            50 Performed at [facility], [place] [address], [place], [place] [address]   Urinalysis, Routine w reflex microscopic -Urine, Clean Catch     Status: Abnormal   Collection Time: 02/28/23  5:00 PM  Result Value Ref Range   Color, Urine STRAW (A) YELLOW   APPearance CLEAR CLEAR   Specific Gravity, Urine 1.009 1.005 - 1.030   pH 7.0 5.0 - 8.0   Glucose, UA NEGATIVE NEGATIVE mg/dL   Hgb urine dipstick SMALL (A)  NEGATIVE   Bilirubin Urine NEGATIVE NEGATIVE   Ketones, ur NEGATIVE NEGATIVE mg/dL   Protein, ur >=300 (A) NEGATIVE mg/dL   Nitrite NEGATIVE NEGATIVE   Leukocytes,Ua NEGATIVE NEGATIVE   RBC / HPF 6-10 0 - 5 RBC/hpf   WBC, UA 0-5 0 - 5 WBC/hpf   Bacteria, UA NONE SEEN NONE SEEN   Squamous Epithelial / HPF 0-5 0 - 5 /HPF    Comment: Performed at [facility], [place] [address], [place], [place] [address]   CT HEAD CODE STROKE WO CONTRAST  Result Date: 02/28/2023 CLINICAL DATA:  Code stroke.  Seizure EXAM: CT HEAD WITHOUT CONTRAST TECHNIQUE: Contiguous axial images were obtained from the base of the skull through the vertex without intravenous contrast. RADIATION DOSE REDUCTION: This exam was performed according to the departmental dose-optimization program which includes automated exposure control, adjustment of the mA and/or kV according to  patient size and/or use of iterative reconstruction technique. COMPARISON:  12/07/2022 CT head FINDINGS: Brain: No evidence of acute infarction, hemorrhage, mass, mass effect, or midline shift. No hydrocephalus or extra-axial collection. 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 Vascular: No hyperdense vessel. Skull: Negative for fracture or focal lesion. Sinuses/Orbits: Mucosal thickening in the left frontal sinus. No acute finding in the orbits. Other: The mastoid air cells are well aerated. ASPECTS [facility] Stroke Program Early CT Score) - Ganglionic level infarction (caudate, lentiform nuclei, internal capsule, insula, M1-M3 cortex): 7 - Supraganglionic infarction (M4-M6 cortex): 3 Total score (0-10 with 10 being normal): 10 IMPRESSION: 1. No acute intracranial process. 2. ASPECTS is 10. Imaging results were communicated on 02/28/2023 at 3:47 pm to provider Dr. Rory Percy via secure text paging. Electronically Signed   By: Merilyn Baba M.D.   On: 02/28/2023 15:48    Pending Labs Unresulted Labs (From  admission, onward)     Start     Ordered   03/07/23 0500  Creatinine, serum  (enoxaparin (LOVENOX)    CrCl >/= 30 ml/min)  Weekly,   R     Comments: while on enoxaparin therapy    02/28/23 1851            Vitals/Pain Today's Vitals   02/28/23 1830 02/28/23 1845 02/28/23 1939 02/28/23 1943  BP: (!) 195/124 (!) 188/118 (!) 178/116   Pulse: 86 83 95   Resp: 20 (!) 21 19   Temp:    98.3 F (36.8 C)  TempSrc:    Oral  SpO2: 98% 97% 100%   Weight:      Height:      PainSc:        Isolation Precautions No active isolations  Medications Medications  thiamine (VITAMIN B1) tablet 100 mg ( Oral See Alternative 02/28/23 1639)    Or  thiamine (VITAMIN B1) injection 100 mg (100 mg Intravenous Given 02/28/23 1639)  multivitamin with minerals tablet 1 tablet (1 tablet Oral Not Given 02/28/23 1630)  LORazepam (ATIVAN) injection 2 mg (has no administration in time range)  levETIRAcetam (KEPPRA) IVPB 500 mg/100 mL premix (has no administration in time range)  folic acid (FOLVITE) tablet 1 mg (has no administration in time range)  amLODipine (NORVASC) tablet 10 mg (has no administration in time range)  furosemide (LASIX) tablet 40 mg (has no administration in time range)  rosuvastatin (CRESTOR) tablet 10 mg (has no administration in time range)  valsartan-hydrochlorothiazide (DIOVAN-HCT) 320-25 MG per tablet 1 tablet (has no administration in time range)  finasteride (PROSCAR) tablet 5 mg (has no administration in time range)  enoxaparin (LOVENOX) injection 40 mg (has no administration in time range)  acetaminophen (TYLENOL) tablet 650 mg (has no administration in time range)    Or  acetaminophen (TYLENOL) suppository 650 mg (has no administration in time range)  HYDROcodone-acetaminophen (NORCO/VICODIN) 5-325 MG per tablet 1-2 tablet (has no administration in time range)  polyethylene glycol (MIRALAX / GLYCOLAX) packet 17 g (has no administration in time range)  ondansetron (ZOFRAN)  tablet 4 mg (has no administration in time range)    Or  ondansetron (ZOFRAN) injection 4 mg (has no administration in time range)  hydrALAZINE (APRESOLINE) injection 10 mg (10 mg Intravenous Given 02/28/23 1929)  labetalol (NORMODYNE) injection 20 mg (has no administration in time range)  ondansetron (ZOFRAN) 4 MG/2ML injection (4 mg  Given by Other 02/28/23 1535)  levETIRAcetam (KEPPRA) IVPB 1500 mg/ 100 mL premix (0 mg  Intravenous Stopped 02/28/23 1603)  morphine (PF) 4 MG/ML injection 4 mg (4 mg Intravenous Given [id number])  folic acid 1 mg in sodium chloride 0.9 % 50 mL IVPB (0 mg Intravenous Stopped 02/28/23 1722)  labetalol (NORMODYNE) injection 20 mg (20 mg Intravenous Given 02/28/23 1640)  potassium chloride SA (KLOR-CON M) CR tablet 40 mEq (40 mEq Oral Given 02/28/23 1816)  labetalol (NORMODYNE) injection 20 mg (20 mg Intravenous Given 02/28/23 1816)    Mobility walks     Focused Assessments Neuro Assessment Handoff:  Swallow screen pass? No  Cardiac Rhythm: Sinus tachycardia NIH Stroke Scale  Dizziness Present: No Headache Present: No Interval: Initial Level of Consciousness (1a.)   : Not alert, but arousable by minor stimulation to obey, answer, or respond LOC Questions (1b. )   : Answers both questions correctly LOC Commands (1c. )   : Performs both tasks correctly Best Gaze (2. )  : Normal Visual (3. )  : No visual loss Facial Palsy (4. )    : Normal symmetrical movements Motor Arm, Left (5a. )   : No drift Motor Arm, Right (5b. ) : No drift Motor Leg, Left (6a. )  : No drift Motor Leg, Right (6b. ) : No drift Limb Ataxia (7. ): Absent Sensory (8. )  : Normal, no sensory loss Best Language (9. )  : No aphasia Dysarthria (10. ): Normal Extinction/Inattention (11.)   : No Abnormality Complete NIHSS TOTAL: 1     Neuro Assessment: Exceptions to WDL Neuro Checks:   Initial (02/28/23 1932)  Has TPA been given? No If patient is a Neuro Trauma and patient is going  to OR before floor call report to [facility] nurse: [phone] or [phone]   R Recommendations: See Admitting Provider Note  Report given to:   Additional Notes: Pt A&Ox3; disoriented to time. Difficulty following directions. Has a foley d/t retention. Pt pulled on foley and so now there is blood in the catheter. Family at bedside. Pt attempted to crawl off the end of the stretcher. High Fall Risk. Continuous EEG in place. Per [place] MD, MRI is non-urgent and can be obtained when able per his note.

## 2023-02-28 NOTE — ED Notes (Signed)
Pt attempted to climb off the end of the bed. Pt assisted back into the bed. Pt w/ blood in his foley now and pt reported he had pulled on it. Primary RN notified

## 2023-02-28 NOTE — Consult Note (Addendum)
Neurology Consultation  Reason for Consult: headache and left sided weakness after seizure activity Referring Physician: Dr. Philip Aspen  CC: headache  History is obtained from: Family and chart  HPI: Edward Wolf is a 77 y.o. male with history of hypertension, hyperlipidemia and BPH who had a possible seizure along with altered mental status and urinary retention in December of last year.  Today, he took a nap at 1300 and awoke with a severe headache.  He then had seizure activity with contraction of his left upper extremity towards his body as well as shaking of his left upper and lower extremity developed into generalized seizure activity.  Has a tongue biting or urinary incontinence. EMS was called, and patient was noted to have some left-sided weakness as well his postictal confusion and severe headache. Code stroke debated due to focal neurological deficits   LKW: 1300 TNK given?: no, low suspicion for stroke IR Thrombectomy? No, exam not consistent with LVO Modified Rankin Scale: 0-Completely asymptomatic and back to baseline post- stroke  ROS: A Unable to obtain due to altered mental status.   Past Medical History:  Diagnosis Date   Arthritis    Asthma    Colon polyps    Gout    Hay fever    Hyperlipidemia    Hypertension    Positive TB test    Urinary retention with incomplete bladder emptying      Family History  Problem Relation Age of Onset   Arthritis Mother    Hyperlipidemia Mother    Heart disease Mother    Hypertension Mother    Uterine cancer Mother    Arthritis Father    Hyperlipidemia Father    Heart disease Father    Hypertension Father    Tuberculosis Father    Hyperlipidemia Sister    Hypertension Sister    Heart disease Sister    Arthritis Maternal Grandmother    Arthritis Maternal Grandfather    Arthritis Paternal Grandmother    Arthritis Paternal Grandfather    Prostate cancer Neg Hx      Social History:   reports that he has been  smoking cigars. He has never used smokeless tobacco. He reports that he does not currently use alcohol. He reports current drug use. Drug: Marijuana.  Medications  Current Facility-Administered Medications:    levETIRAcetam (KEPPRA) IVPB 1500 mg/ 100 mL premix, 3,000 mg, Intravenous, STAT, Amie Portland, MD, Last Rate: 800 mL/hr at 02/28/23 1548, 3,000 mg at 02/28/23 1548   midazolam (VERSED) 2 MG/2ML injection, , , ,    midazolam (VERSED) 2 MG/2ML injection, , , ,   Current Outpatient Medications:    potassium chloride SA (KLOR-CON M) 20 MEQ tablet, Take 1 tablet (20 mEq total) by mouth every other day., Disp: 60 tablet, Rfl: 1   acetaminophen (TYLENOL) 500 MG tablet, Take 500 mg by mouth every 6 (six) hours as needed for mild pain., Disp: , Rfl:    albuterol (VENTOLIN HFA) 108 (90 Base) MCG/ACT inhaler, Inhale 1 puff into the lungs every 6 (six) hours as needed for wheezing or shortness of breath., Disp: 18 g, Rfl: 5   allopurinol (ZYLOPRIM) 300 MG tablet, TAKE 1 TABLET BY MOUTH EVERY DAY, Disp: 90 tablet, Rfl: 1   amLODipine (NORVASC) 10 MG tablet, Take 10 mg by mouth daily., Disp: , Rfl:    finasteride (PROSCAR) 5 MG tablet, Take 5 mg by mouth daily., Disp: , Rfl:    furosemide (LASIX) 20 MG tablet, Take 2 tablets (40  mg total) by mouth daily., Disp: 180 tablet, Rfl: 1   minocycline (MINOCIN) 100 MG capsule, TAKE 1 CAPSULE (100 MG TOTAL) BY MOUTH DAILY AS NEEDED (URINARY PROBLEMS)., Disp: 90 capsule, Rfl: 1   Multiple Vitamin (MULTIVITAMIN) tablet, Take 1 tablet by mouth every other day., Disp: , Rfl:    rosuvastatin (CRESTOR) 10 MG tablet, Take 1 tablet (10 mg total) by mouth daily., Disp: 90 tablet, Rfl: 1   sildenafil (VIAGRA) 100 MG tablet, Take 1 tablet (100 mg total) by mouth daily as needed., Disp: 30 tablet, Rfl: 1   valsartan-hydrochlorothiazide (DIOVAN-HCT) 320-25 MG tablet, Take 1 tablet by mouth daily., Disp: , Rfl:    Exam: Current vital signs: BP (!) 190/120   Pulse (!)  113   Resp (!) 22   Ht 6\' 1"  (1.854 m)   Wt 107 kg   SpO2 98%   BMI 31.12 kg/m  Vital signs in last 24 hours: Pulse Rate:  [113-117] 113 (03/21 1545) Resp:  [16-22] 22 (03/21 1545) BP: (184-200)/(120-166) 190/120 (03/21 1545) SpO2:  [98 %-100 %] 98 % (03/21 1545) Weight:  [107 kg] 107 kg (03/21 1527)  GENERAL: Drowsy, awake but restless Psych: Patient is restless and follows commands intermittently Head: Normocephalic and atraumatic, without obvious abnormality EENT: Normal conjunctivae, moist mucous membranes, no OP obstruction LUNGS: Normal respiratory effort. Non-labored breathing on room air Extremities: warm, well perfused, without obvious deformity  NEURO:  Mental Status: Drowsy but unable to state name or place He is not able to provide a clear and coherent history of present illness. Speech/Language: speech is in single words and short phrases but clear Able to name objects No neglect is noted Cranial Nerves:  II: PERRL  III, IV, VI: EOMI. Lid elevation symmetric and full.  VII: Face is symmetric resting  VIII: Hearing intact to voice IX, X: Phonation normal.  XII: Uncooperative with tongue protrusion Motor: Good antigravity strength in bilateral upper extremities without drift, some antigravity strength present in bilateral lower extremities, symmetrical Tone is normal. Bulk is normal.  Sensation: Intact to light touch bilaterally in all four extremities.  Coordination: Unable to perform Gait: Deferred  NIHSS: 1a Level of Conscious.: 0 1b LOC Questions: 2 1c LOC Commands: 1 2 Best Gaze: 0 3 Visual: 0 4 Facial Palsy: 0 5a Motor Arm - left: 0 5b Motor Arm - Right: 0 6a Motor Leg - Left: 2 6b Motor Leg - Right: 2 7 Limb Ataxia: 0 8 Sensory: 0 9 Best Language: 0 10 Dysarthria: 0 11 Extinct. and Inatten.: 0 TOTAL: 7   Labs I have reviewed labs in epic and the results pertinent to this consultation are: Labs currently pending  Imaging I have reviewed  the images obtained:  CT-scan of the brain: No acute abnormalities  MRI examination of the brain: Pending  Long-term EEG-ordered stat-pending  Assessment: 77 year old patient with history of hyperlipidemia, hypertension and BPH presents with seizure activity, headache and left-sided weakness.  Last December, he had an admission after questionable seizure activity followed by agitation and encephalopathy with urinary retention.  EEG did not show any ictal activity, and patient was not started on AEDs.  Today, patient took a nap at 1300 and awoke with a severe headache, and had seizure activity consisting of contraction of his left upper extremity towards his body along with shaking of his left upper and lower extremities evolving into a generalized seizure.  He did bite his tongue and the seizure activity and was incontinent of urine  in the ED.  On exam, he was noted to be restless and confused and would intermittently follow commands.  Head CT demonstrated no acute abnormalities.  Left-sided weakness had resolved at time of exam, and patient became more alert, communicative and able to follow commands over time.  Was also noted to be quite hypertensive with systolics in the [id number].  He will need treatment of hypertensive urgency per primary team.  Patient will need long-term EEG, as well as brain MRI.  He was loaded with 3 g of Keppra and will need 500 mg of Keppra twice daily.  Use 2 mg of IV Ativan for further seizure activity.  Will check UDS given the patient had been positive for THC on prior admission  Impression:  Second time seizure-could be related to hypertensive urgency but at this point warrants treatment  Recommendations: -Long-term EEG -Loaded with 3 g of Keppra -2 Milligrams of IV Ativan for further seizure activity greater than 5 minutes -Keppra 500 mg twice daily -Brain MRI with and without contrast if possible -UDS given history of marijuana abuse -Seizure precautions -Treatment  of hypertensive urgency per primary team Plan discussed with Dr. Sharlett Iles   Pt seen by NP/Neuro and later by MD. Note/plan to be edited by MD as needed.  [place] , MSN, AGACNP-BC [facility] See Amion for schedule and pager information 02/28/2023 3:57 PM   Attending Neurohospitalist Addendum Patient seen and examined with APP/Resident. Agree with the history and physical as documented above. Agree with the plan as documented, which I helped formulate. I have independently reviewed the chart, obtained history, review of systems and examined the patient.I have personally reviewed pertinent head/neck/spine imaging (CT/MRI). Please feel free to call with any questions.  -- Amie Portland, MD Neurologist [facility] Pager: [phone]   SEIZURE PRECAUTIONS Per [facility] [place] statutes, patients with seizures are not allowed to drive until they have been seizure-free for six months.   Use caution when using heavy equipment or power tools. Avoid working on ladders or at heights. Take showers instead of baths. Ensure the water temperature is not too high on the home water heater. Do not go swimming alone. Do not lock yourself in a room alone (i.e. bathroom). When caring for infants or small children, sit down when holding, feeding, or changing them to minimize risk of injury to the child in the event you have a seizure. Maintain good sleep hygiene. Avoid alcohol.    If patient has another seizure, call 911 and bring them back to the ED if: A.  The seizure lasts longer than 5 minutes.      B.  The patient doesn't wake shortly after the seizure or has new problems such as difficulty seeing, speaking or moving following the seizure C.  The patient was injured during the seizure D.  The patient has a temperature over 102 F (39C) E.  The patient vomited during the seizure and now is having trouble breathing

## 2023-02-28 NOTE — Progress Notes (Signed)
LTM moved to inpatient room. All leads attached.  [facility] monitored, Event button test confirmed by [facility].

## 2023-02-28 NOTE — ED Provider Notes (Signed)
[facility] Provider Note   CSN: [id number] Arrival date & time: 02/28/23  1519  An emergency department physician performed an initial assessment on this suspected stroke patient at 46.  History  Chief Complaint  Patient presents with   Seizures   Code Stroke    Edward Wolf is a 77 y.o. male with PMH HLD, HTN and for seizure last year associated with hypertension as well.  Presents via EMS with sudden onset headache as well as left-sided contraction of the arm that moved to generalized seizure that lasted approximately 10 minutes per wife.  Per wife patient did bite his tongue and had bleeding from his mouth, no emesis. She states that he has been in normal health in the days leading up to today, not complaining of anything hurting or feeling ill, no fevers, no recent illness, eating and drinking appropriately, compliant with all of his medications. EMS did not need to give any abortive medications.   Seizures      Home Medications Prior to Admission medications   Medication Sig Start Date End Date Taking? Authorizing Provider  potassium chloride SA (KLOR-CON M) 20 MEQ tablet Take 1 tablet (20 mEq total) by mouth every other day. 12/14/22   [place], Amy E, NP  acetaminophen (TYLENOL) 500 MG tablet Take 500 mg by mouth every 6 (six) hours as needed for mild pain.    [provider]  albuterol (VENTOLIN HFA) 108 (90 Base) MCG/ACT inhaler Inhale 1 puff into the lungs every 6 (six) hours as needed for wheezing or shortness of breath. 03/20/22   Wardell Honour, MD  allopurinol (ZYLOPRIM) 300 MG tablet TAKE 1 TABLET BY MOUTH EVERY DAY 12/19/22   Wardell Honour, MD  amLODipine (NORVASC) 10 MG tablet Take 10 mg by mouth daily.    [provider]  finasteride (PROSCAR) 5 MG tablet Take 5 mg by mouth daily. 07/10/21   [provider]  furosemide (LASIX) 20 MG tablet Take 2 tablets (40 mg total) by mouth daily. 08/28/22    Wardell Honour, MD  minocycline (MINOCIN) 100 MG capsule TAKE 1 CAPSULE (100 MG TOTAL) BY MOUTH DAILY AS NEEDED (URINARY PROBLEMS). 10/25/22   Wardell Honour, MD  Multiple Vitamin (MULTIVITAMIN) tablet Take 1 tablet by mouth every other day.    [provider]  rosuvastatin (CRESTOR) 10 MG tablet Take 1 tablet (10 mg total) by mouth daily. 10/08/22   Wardell Honour, MD  sildenafil (VIAGRA) 100 MG tablet Take 1 tablet (100 mg total) by mouth daily as needed. 11/07/22     valsartan-hydrochlorothiazide (DIOVAN-HCT) 320-25 MG tablet Take 1 tablet by mouth daily.    [provider]      Allergies    Patient has no known allergies.    Review of Systems   See HPI  Physical Exam Updated Vital Signs BP (!) 174/115 (BP Location: Left Arm)   Pulse 90   Temp 98.8 F (37.1 C) (Oral)   Resp 18   Ht 6\' 1"  (1.854 m)   Wt 107 kg   SpO2 100%   BMI 31.12 kg/m  Physical Exam Vitals and nursing note reviewed.  Constitutional:      Appearance: He is well-developed.     Comments: Somnolent, but arousable.  Restless and not following commands well  HENT:     Head: Normocephalic and atraumatic.     Mouth/Throat:     Comments: Laceration on the tongue that is  hemostatic currently, dried blood noted around the mouth Eyes:     Conjunctiva/sclera: Conjunctivae normal.     Pupils: Pupils are equal, round, and reactive to light.  Cardiovascular:     Rate and Rhythm: Regular rhythm. Tachycardia present.     Pulses: Normal pulses.     Heart sounds: Normal heart sounds. No murmur heard. Pulmonary:     Effort: Pulmonary effort is normal. No respiratory distress.     Breath sounds: Normal breath sounds.  Abdominal:     General: There is no distension.     Tenderness: There is no abdominal tenderness.  Musculoskeletal:        General: No swelling.     Cervical back: Normal range of motion and neck supple. No tenderness.  Skin:    General: Skin is warm and dry.     Capillary  Refill: Capillary refill takes less than 2 seconds.  Neurological:     Sensory: No sensory deficit.     Motor: Weakness (Left-sided upper and lower extremity) present.     Comments: Oriented to name only   Psychiatric:        Mood and Affect: Mood normal.     ED Results / Procedures / Treatments   Labs (all labs ordered are listed, but only abnormal results are displayed) Labs Reviewed  CBC - Abnormal; Notable for the following components:      Result Value   WBC 11.2 (*)    All other components within normal limits  COMPREHENSIVE METABOLIC PANEL - Abnormal; Notable for the following components:   Potassium 3.0 (*)    CO2 20 (*)    Glucose, Bld 129 (*)    BUN 7 (*)    Anion gap 17 (*)    All other components within normal limits  RAPID URINE DRUG SCREEN, HOSP PERFORMED - Abnormal; Notable for the following components:   Opiates POSITIVE (*)    Tetrahydrocannabinol POSITIVE (*)    All other components within normal limits  URINALYSIS, ROUTINE W REFLEX MICROSCOPIC - Abnormal; Notable for the following components:   Color, Urine STRAW (*)    Hgb urine dipstick SMALL (*)    Protein, ur >=300 (*)    All other components within normal limits  CBG MONITORING, ED - Abnormal; Notable for the following components:   Glucose-Capillary 127 (*)    All other components within normal limits  I-STAT CHEM 8, ED - Abnormal; Notable for the following components:   Potassium 2.7 (*)    Glucose, Bld 126 (*)    Calcium, Ion 1.11 (*)    All other components within normal limits  ETHANOL  PROTIME-INR  APTT  DIFFERENTIAL  BASIC METABOLIC PANEL    EKG EKG Interpretation  Date/Time:  Thursday February 28 2023 15:56:08 EDT Ventricular Rate:  111 PR Interval:  186 QRS Duration: 95 QT Interval:  343 QTC Calculation: 467 R Axis:   -38 Text Interpretation: Sinus tachycardia Left axis deviation Low voltage, precordial leads Anteroseptal infarct, old Borderline T wave abnormalities Confirmed  by Margaretmary Eddy [phone]) on 02/28/2023 3:57:52 PM  Radiology CT HEAD CODE STROKE WO CONTRAST  Result Date: 02/28/2023 CLINICAL DATA:  Code stroke.  Seizure EXAM: CT HEAD WITHOUT CONTRAST TECHNIQUE: Contiguous axial images were obtained from the base of the skull through the vertex without intravenous contrast. RADIATION DOSE REDUCTION: This exam was performed according to the departmental dose-optimization program which includes automated exposure control, adjustment of the mA and/or kV according to patient size and/or  use of iterative reconstruction technique. COMPARISON:  12/07/2022 CT head FINDINGS: Brain: No evidence of acute infarction, hemorrhage, mass, mass effect, or midline shift. No hydrocephalus or extra-axial collection. 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 Vascular: No hyperdense vessel. Skull: Negative for fracture or focal lesion. Sinuses/Orbits: Mucosal thickening in the left frontal sinus. No acute finding in the orbits. Other: The mastoid air cells are well aerated. ASPECTS [facility] Stroke Program Early CT Score) - Ganglionic level infarction (caudate, lentiform nuclei, internal capsule, insula, M1-M3 cortex): 7 - Supraganglionic infarction (M4-M6 cortex): 3 Total score (0-10 with 10 being normal): 10 IMPRESSION: 1. No acute intracranial process. 2. ASPECTS is 10. Imaging results were communicated on 02/28/2023 at 3:47 pm to provider Dr. Rory Percy via secure text paging. Electronically Signed   By: Merilyn Baba M.D.   On: 02/28/2023 15:48    Medications Ordered in ED Medications  thiamine (VITAMIN B1) tablet 100 mg ( Oral See Alternative 02/28/23 1639)    Or  thiamine (VITAMIN B1) injection 100 mg (100 mg Intravenous Given 02/28/23 1639)  multivitamin with minerals tablet 1 tablet (1 tablet Oral Not Given 02/28/23 1630)  LORazepam (ATIVAN) injection 2 mg (has no administration in time range)  levETIRAcetam (KEPPRA) IVPB 500  mg/100 mL premix (500 mg Intravenous New Bag/Given [id number])  folic acid (FOLVITE) tablet 1 mg (has no administration in time range)  amLODipine (NORVASC) tablet 10 mg (10 mg Oral Given 02/28/23 2159)  furosemide (LASIX) tablet 40 mg (40 mg Oral Given 02/28/23 2159)  rosuvastatin (CRESTOR) tablet 10 mg (10 mg Oral Given 02/28/23 2200)  finasteride (PROSCAR) tablet 5 mg (5 mg Oral Given 02/28/23 2200)  enoxaparin (LOVENOX) injection 40 mg (40 mg Subcutaneous Given 02/28/23 2200)  acetaminophen (TYLENOL) tablet 650 mg (has no administration in time range)    Or  acetaminophen (TYLENOL) suppository 650 mg (has no administration in time range)  HYDROcodone-acetaminophen (NORCO/VICODIN) 5-325 MG per tablet 1-2 tablet (has no administration in time range)  polyethylene glycol (MIRALAX / GLYCOLAX) packet 17 g (has no administration in time range)  ondansetron (ZOFRAN) tablet 4 mg (has no administration in time range)    Or  ondansetron (ZOFRAN) injection 4 mg (has no administration in time range)  hydrALAZINE (APRESOLINE) injection 10 mg (10 mg Intravenous Given 02/28/23 1929)  labetalol (NORMODYNE) injection 20 mg (has no administration in time range)  irbesartan (AVAPRO) tablet 300 mg (300 mg Oral Given 02/28/23 2159)    And  hydrochlorothiazide (HYDRODIURIL) tablet 25 mg (25 mg Oral Given 02/28/23 2200)  ondansetron (ZOFRAN) 4 MG/2ML injection (4 mg  Given by Other 02/28/23 1535)  levETIRAcetam (KEPPRA) IVPB 1500 mg/ 100 mL premix (0 mg Intravenous Stopped 02/28/23 1603)  morphine (PF) 4 MG/ML injection 4 mg (4 mg Intravenous Given [id number])  folic acid 1 mg in sodium chloride 0.9 % 50 mL IVPB (0 mg Intravenous Stopped 02/28/23 1722)  labetalol (NORMODYNE) injection 20 mg (20 mg Intravenous Given 02/28/23 1640)  potassium chloride SA (KLOR-CON M) CR tablet 40 mEq (40 mEq Oral Given 02/28/23 1816)  labetalol (NORMODYNE) injection 20 mg (20 mg Intravenous Given 02/28/23 1816)  potassium chloride  (KLOR-CON) packet 40 mEq (40 mEq Oral Given 02/28/23 2159)    ED Course/ Medical Decision Making/ A&P Clinical Course as of 02/28/23 2240  Thu Feb 28, 2023  1600 Dr Rory Percy from neurology has seen pt. Recommends starting keppra, eeg, and MRI with medicine admission.  [RP]  Clinical Course User Index [RP] Fransico Meadow, MD                             Medical Decision Making Amount and/or Complexity of Data Reviewed Labs: ordered. Radiology: ordered.  Risk Prescription drug management. Decision regarding hospitalization.   On arrival patient is hypertensive with SBP greater than 200, tachycardic and tachypneic, but saturating well on room air.  Initial exam showed that patient was only oriented to his name and was not moving following commands on left upper extremity or lower extremity.  He was restless in bed and not following commands well.  Code stroke was activated and neurology arrived at bedside.  Per the recommendation, patient was taken immediately to the scanner.  Upon reevaluation, patient was able to move all extremities and had equal weakness on both sides.  He was more alert and oriented to name and place.  He is complaining of his head hurting.  Given morphine with some improvement of headache as well as Zofran for his nausea.  Patient was given labetalol with minimal improvement of blood pressure.  Per neurology recommendations patient was admitted to hospital service for further care and observation.         Final Clinical Impression(s) / ED Diagnoses Final diagnoses:  Seizures ([place])  Hypertension, unspecified type     Bradd Canary, MD 02/28/23 2241    Fransico Meadow, MD 03/01/23 [phone]

## 2023-02-28 NOTE — ED Provider Notes (Incomplete)
[facility] Provider Note   CSN: [id number] Arrival date & time: 02/28/23  1519     History {Add pertinent medical, surgical, social history, OB history to HPI:1} Chief Complaint  Patient presents with   Seizures   Code Stroke    Edward Wolf is a 77 y.o. male.  HPI     Home Medications Prior to Admission medications   Medication Sig Start Date End Date Taking? Authorizing Provider  potassium chloride SA (KLOR-CON M) 20 MEQ tablet Take 1 tablet (20 mEq total) by mouth every other day. 12/14/22   Fargo, Amy E, NP  acetaminophen (TYLENOL) 500 MG tablet Take 500 mg by mouth every 6 (six) hours as needed for mild pain.    [provider]  albuterol (VENTOLIN HFA) 108 (90 Base) MCG/ACT inhaler Inhale 1 puff into the lungs every 6 (six) hours as needed for wheezing or shortness of breath. 03/20/22   Wardell Honour, MD  allopurinol (ZYLOPRIM) 300 MG tablet TAKE 1 TABLET BY MOUTH EVERY DAY 12/19/22   Wardell Honour, MD  amLODipine (NORVASC) 10 MG tablet Take 10 mg by mouth daily.    [provider]  finasteride (PROSCAR) 5 MG tablet Take 5 mg by mouth daily. 07/10/21   [provider]  furosemide (LASIX) 20 MG tablet Take 2 tablets (40 mg total) by mouth daily. 08/28/22   Wardell Honour, MD  minocycline (MINOCIN) 100 MG capsule TAKE 1 CAPSULE (100 MG TOTAL) BY MOUTH DAILY AS NEEDED (URINARY PROBLEMS). 10/25/22   Wardell Honour, MD  Multiple Vitamin (MULTIVITAMIN) tablet Take 1 tablet by mouth every other day.    [provider]  rosuvastatin (CRESTOR) 10 MG tablet Take 1 tablet (10 mg total) by mouth daily. 10/08/22   Wardell Honour, MD  sildenafil (VIAGRA) 100 MG tablet Take 1 tablet (100 mg total) by mouth daily as needed. 11/07/22     valsartan-hydrochlorothiazide (DIOVAN-HCT) 320-25 MG tablet Take 1 tablet by mouth daily.    [provider]      Allergies    Patient has no known  allergies.    Review of Systems   Review of Systems  Physical Exam Updated Vital Signs BP (!) 184/166   Pulse (!) 117   Resp 16   Ht 6\' 1"  (1.854 m)   Wt 107 kg   SpO2 100%   BMI 31.12 kg/m  Physical Exam  ED Results / Procedures / Treatments   Labs (all labs ordered are listed, but only abnormal results are displayed) Labs Reviewed  CBG MONITORING, ED - Abnormal; Notable for the following components:      Result Value   Glucose-Capillary 127 (*)    All other components within normal limits  ETHANOL  PROTIME-INR  APTT  CBC  DIFFERENTIAL  COMPREHENSIVE METABOLIC PANEL  ETHANOL  RAPID URINE DRUG SCREEN, HOSP PERFORMED  URINALYSIS, ROUTINE W REFLEX MICROSCOPIC  I-STAT CHEM 8, ED    EKG None  Radiology No results found.  Procedures Procedures  {Document cardiac monitor, telemetry assessment procedure when appropriate:1}  Medications Ordered in ED Medications - No data to display  ED Course/ Medical Decision Making/ A&P   {   Click here for ABCD2, HEART and other calculatorsREFRESH Note before signing :1}                          Medical Decision Making Amount and/or Complexity of Data  Reviewed Labs: ordered. Radiology: ordered.   ***  {Document critical care time when appropriate:1} {Document review of labs and clinical decision tools ie heart score, Chads2Vasc2 etc:1}  {Document your independent review of radiology images, and any outside records:1} {Document your discussion with family members, caretakers, and with consultants:1} {Document social determinants of health affecting pt's care:1} {Document your decision making why or why not admission, treatments were needed:1} Final Clinical Impression(s) / ED Diagnoses Final diagnoses:  None    Rx / DC Orders ED Discharge Orders     None

## 2023-02-28 NOTE — ED Notes (Signed)
Called to activate a code stroke per dr. Philip Aspen

## 2023-02-28 NOTE — ED Notes (Signed)
Pt back in Tra B. Pt is confused and having difficulty following commands.

## 2023-02-28 NOTE — Progress Notes (Signed)
LTM maint complete - no skin breakdown . Reapplied Fp2, wrapped head. 1958 Study running and online.

## 2023-02-28 NOTE — ED Notes (Signed)
Bladder scan resulted at greater than 260.

## 2023-02-28 NOTE — ED Notes (Signed)
Notified EDP and Aurora MD about pt CIWA 10, but also the fact that the headache is probably from the HTN and the disorientation d/t postictal. Per Philip Aspen MD, hold the ativan.

## 2023-02-28 NOTE — ED Triage Notes (Signed)
Pt arrived to ED via EMS from home after pt was witnessed having a full body seizure. Pt LKW 1300 when he took a nap. When he woke up pt was c/o headache then had the seizure. Hx of HTN, no hx of sz or cva. Pt postictal through most of transport and then became alert and confused. Initial BP 270/110. Last BP 205/139, CBG 116, HR 120 ST. 18g L FA.

## 2023-02-28 NOTE — ED Notes (Signed)
Pt bladder emptied w/ catheter and was still draining > 753mL. Foley inserted d/t retention.

## 2023-02-28 NOTE — Code Documentation (Signed)
Edward Wolf is a 77 yr old male with a H/O HTN and seizures presenting to [facility] on 02/28/2023. Pt is from home where he was LKW when he lay down for a nap at 1300. He then awoke, and had a severe h/a and subsequent seizure. Code stroke was activated for left sided weakness post seizure.    Stroke team to Trauma B. CBG, Labs, VS have been obtained. Pt is hypertensive and confused, but non-focal. He is complaining of H/A. Pt to CT scanner with team. NIHSS6. Pt drowsy, states wrong month, unable to resist gravity in both legs. Pt required a lot of coaxing to lie down in scanner. The following imaging was obtained: CT head. CT was negative for acute hemorrhage per Dr Rory Percy.    Pt back to Trauma B where his workup will continue. He will need q 2 hr VS and NIHSS. Bedside handoff with Raquel Sarna RN complete.Pt not a candidate for thrombolysis as low suspicion for stroke. Pt ineligible for thrombectomy as no LVO signs, low suspicion for stroke.

## 2023-02-28 NOTE — H&P (Signed)
History and Physical    Edward Wolf [id number] DOB: [birth year] DOA: 02/28/2023  PCP: Wardell Honour, MD   Chief Complaint: seizure like activity  HPI: Edward Wolf is a 77 y.o. male with medical history significant for hyperlipidemia, hypertension who presented to the emergency department after possible seizure-like activity and altered mental status.  Patient reportedly slept around 1 PM and woke up due to headache he was noted by family to have a contracted left side so EMS was called due to concern for stroke.  He was transported to the ER for further assessment.  On arrival he was noted to have a bitten tongue with bleeding coming from his mouth.  He was tachycardic in the low 100s and hypertensive with systolics over [id number].  Neurology was consulted in the emergency department due to concern for stroke.  At time of assessment he was drowsy not able to provide any history he was neurologically intact.  CT head was obtained which showed no acute intracranial abnormality.  EEG did not show any ictal activity.  Patient was placed on long-term EEG and MRI was ordered.  He was also loaded with Keppra due to concern for seizure.  Labs on arrival demonstrated WBC 11.2, hemoglobin 15.6, platelets 297, potassium 3.0, glucose 129, repeat potassium 2.7, creatinine 1.1, UDS positive for opioids and THC.  Urinalysis negative for infection.  Patient was admitted for further workup.  On evaluation patient was oriented to place time and self.  Repeat neurologic exam showed no focal deficits.  He did not know why he was here.  He said the last thing he remembers was eating lunch.  He denied other complaints.  Review of Systems: Review of Systems  All other systems reviewed and are negative.    As per HPI otherwise 10 point review of systems negative.   No Known Allergies  Past Medical History:  Diagnosis Date   Arthritis    Asthma    Colon polyps    Gout    Hay fever    Hyperlipidemia     Hypertension    Positive TB test    Urinary retention with incomplete bladder emptying     Past Surgical History:  Procedure Laterality Date   COLONOSCOPY     [place]     reports that he has been smoking cigars. He has never used smokeless tobacco. He reports that he does not currently use alcohol. He reports current drug use. Drug: Marijuana.  Family History  Problem Relation Age of Onset   Arthritis Mother    Hyperlipidemia Mother    Heart disease Mother    Hypertension Mother    Uterine cancer Mother    Arthritis Father    Hyperlipidemia Father    Heart disease Father    Hypertension Father    Tuberculosis Father    Hyperlipidemia Sister    Hypertension Sister    Heart disease Sister    Arthritis Maternal Grandmother    Arthritis Maternal Grandfather    Arthritis Paternal Grandmother    Arthritis Paternal Grandfather    Prostate cancer Neg Hx     Prior to Admission medications   Medication Sig Start Date End Date Taking? Authorizing Provider  potassium chloride SA (KLOR-CON M) 20 MEQ tablet Take 1 tablet (20 mEq total) by mouth every other day. 12/14/22   Fargo, Amy E, NP  acetaminophen (TYLENOL) 500 MG tablet Take 500 mg  by mouth every 6 (six) hours as needed for mild pain.    [provider]  albuterol (VENTOLIN HFA) 108 (90 Base) MCG/ACT inhaler Inhale 1 puff into the lungs every 6 (six) hours as needed for wheezing or shortness of breath. 03/20/22   Wardell Honour, MD  allopurinol (ZYLOPRIM) 300 MG tablet TAKE 1 TABLET BY MOUTH EVERY DAY 12/19/22   Wardell Honour, MD  amLODipine (NORVASC) 10 MG tablet Take 10 mg by mouth daily.    [provider]  finasteride (PROSCAR) 5 MG tablet Take 5 mg by mouth daily. 07/10/21   [provider]  furosemide (LASIX) 20 MG tablet Take 2 tablets (40 mg total) by mouth daily. 08/28/22   Wardell Honour, MD  minocycline (MINOCIN) 100 MG capsule TAKE  1 CAPSULE (100 MG TOTAL) BY MOUTH DAILY AS NEEDED (URINARY PROBLEMS). 10/25/22   Wardell Honour, MD  Multiple Vitamin (MULTIVITAMIN) tablet Take 1 tablet by mouth every other day.    [provider]  rosuvastatin (CRESTOR) 10 MG tablet Take 1 tablet (10 mg total) by mouth daily. 10/08/22   Wardell Honour, MD  sildenafil (VIAGRA) 100 MG tablet Take 1 tablet (100 mg total) by mouth daily as needed. 11/07/22     valsartan-hydrochlorothiazide (DIOVAN-HCT) 320-25 MG tablet Take 1 tablet by mouth daily.    [provider]    Physical Exam: Vitals:   02/28/23 1939 02/28/23 1943 02/28/23 1945 02/28/23 2043  BP: (!) 178/116  (!) 165/106 (!) 174/115  Pulse: 95  94 90  Resp: 19  17 18   Temp:  98.3 F (36.8 C)  98.8 F (37.1 C)  TempSrc:  Oral  Oral  SpO2: 100%  100% 100%  Weight:      Height:       Physical Exam Constitutional:      Appearance: He is normal weight.  HENT:     Head: Normocephalic.     Nose: Nose normal.     Mouth/Throat:     Comments: Bittent ongue Cardiovascular:     Rate and Rhythm: Regular rhythm. Tachycardia present.     Pulses: Normal pulses.  Pulmonary:     Effort: Pulmonary effort is normal.  Abdominal:     General: Bowel sounds are normal.  Musculoskeletal:        General: Normal range of motion.     Cervical back: Normal range of motion.  Skin:    General: Skin is warm.     Capillary Refill: Capillary refill takes less than 2 seconds.  Neurological:     General: No focal deficit present.     Mental Status: He is alert and oriented to person, place, and time. Mental status is at baseline.     Cranial Nerves: No cranial nerve deficit.     Sensory: No sensory deficit.     Motor: No weakness.     Coordination: Coordination normal.     Deep Tendon Reflexes: Reflexes normal.  Psychiatric:        Mood and Affect: Mood normal.       Labs on Admission: I have personally reviewed the patients's labs and imaging  studies.  Assessment/Plan Principal Problem:   Hypertensive emergency Active Problems:   CVA (cerebral vascular accident) ([place])   Concern for seizure, POA, active - Patient presented with incontinence and bitten tongue as well as reported seizure-like activity - Neurology evaluated and placed on the EEG  Plan: Follow-up neurology recommendations and EEG Follow-up MRI  Cont keppra  Acute encephalopathy most likely secondary to hypertensive emergency, POA, active - Patient had systolics over A999333 in setting of altered mental status - Patient on amlodipine, angiotensin receptor blocker and ordered hydrochlorothiazide Plan: Resume p.o. medications Placed on IV labetalol and IV hydralazine  BPH-continue finasteride  Hypokalemia- replete   Admission status: Inpatient Progressive  Certification: The appropriate patient status for this patient is INPATIENT. Inpatient status is judged to be reasonable and necessary in order to provide the required intensity of service to ensure the patient's safety. The patient's presenting symptoms, physical exam findings, and initial radiographic and laboratory data in the context of their chronic comorbidities is felt to place them at high risk for further clinical deterioration. Furthermore, it is not anticipated that the patient will be medically stable for discharge from the hospital within 2 midnights of admission.   * I certify that at the point of admission it is my clinical judgment that the patient will require inpatient hospital care spanning beyond 2 midnights from the point of admission due to high intensity of service, high risk for further deterioration and high frequency of surveillance required.Emilee Hero MD [facility] If 7PM-7AM, please contact night-coverage [email or web address]  02/28/2023, 9:09 PM

## 2023-02-28 NOTE — Progress Notes (Signed)
EEG complete - results pending 

## 2023-03-01 ENCOUNTER — Inpatient Hospital Stay (HOSPITAL_COMMUNITY): Payer: Medicare Other

## 2023-03-01 DIAGNOSIS — R569 Unspecified convulsions: Secondary | ICD-10-CM | POA: Diagnosis not present

## 2023-03-01 DIAGNOSIS — I161 Hypertensive emergency: Secondary | ICD-10-CM | POA: Diagnosis not present

## 2023-03-01 LAB — POTASSIUM: Potassium: 2.9 mmol/L — ABNORMAL LOW (ref 3.5–5.1)

## 2023-03-01 LAB — BASIC METABOLIC PANEL
Anion gap: 11 (ref 5–15)
BUN: 7 mg/dL — ABNORMAL LOW (ref 8–23)
CO2: 25 mmol/L (ref 22–32)
Calcium: 9 mg/dL (ref 8.9–10.3)
Chloride: 103 mmol/L (ref 98–111)
Creatinine, Ser: 0.96 mg/dL (ref 0.61–1.24)
GFR, Estimated: >60 mL/min (ref >60)
Glucose, Bld: 102 mg/dL — ABNORMAL HIGH (ref 70–99)
Potassium: 2.7 mmol/L — CL (ref 3.5–5.1)
Sodium: 139 mmol/L (ref 135–145)

## 2023-03-01 LAB — MAGNESIUM: Magnesium: 2.1 mg/dL (ref 1.7–2.4)

## 2023-03-01 MED ORDER — POTASSIUM CHLORIDE CRYS ER 20 MEQ PO TBCR: 20.0000 meq | EXTENDED_RELEASE_TABLET | Freq: Every day | ORAL | 1 refills | Status: DC

## 2023-03-01 MED ORDER — IRBESARTAN 300 MG PO TABS: 300.0000 mg | ORAL_TABLET | Freq: Every day | ORAL | 0 refills | Status: DC

## 2023-03-01 MED ORDER — POTASSIUM CHLORIDE CRYS ER 20 MEQ PO TBCR
40.0000 meq | EXTENDED_RELEASE_TABLET | ORAL | Status: DC
  Administered 2023-03-01 (×2): 40 meq via ORAL
  Filled 2023-03-01 (×2): qty 2

## 2023-03-01 MED ORDER — LEVETIRACETAM 500 MG PO TABS: 500.0000 mg | ORAL_TABLET | Freq: Two times a day (BID) | ORAL | 2 refills | Status: DC

## 2023-03-01 MED ORDER — POTASSIUM CHLORIDE CRYS ER 20 MEQ PO TBCR
40.0000 meq | EXTENDED_RELEASE_TABLET | Freq: Three times a day (TID) | ORAL | Status: AC
  Administered 2023-03-01 – 2023-03-02 (×3): 40 meq via ORAL
  Filled 2023-03-01 (×3): qty 2

## 2023-03-01 MED ORDER — POTASSIUM CHLORIDE CRYS ER 20 MEQ PO TBCR
40.0000 meq | EXTENDED_RELEASE_TABLET | ORAL | Status: DC
  Administered 2023-03-01: 40 meq via ORAL
  Filled 2023-03-01: qty 2

## 2023-03-01 MED ORDER — GADOBUTROL 1 MMOL/ML IV SOLN
10.0000 mL | Freq: Once | INTRAVENOUS | Status: AC | PRN
  Administered 2023-03-01: 10 mL via INTRAVENOUS

## 2023-03-01 NOTE — Progress Notes (Addendum)
Neurology Progress Note   S:// Seen and examined.  Feeling much better. EEG shows continuous right spike slowing. With his seizure semiology that involves the upper drawing of the left arm and left-sided shaking followed by generalization of the seizure, I suspect that he has a right MS focus of seizure from where his seizure emanates.  This is the second time he is having seizure-I will continue him on AEDs.   O:// Current vital signs: BP (!) 157/98 (BP Location: Left Arm)   Pulse 82   Temp 98.7 F (37.1 C) (Oral)   Resp 18   Ht 6\' 1"  (1.854 m)   Wt 107 kg   SpO2 99%   BMI 31.12 kg/m  Vital signs in last 24 hours: Temp:  [97.9 F (36.6 C)-98.8 F (37.1 C)] 98.7 F (37.1 C) (03/22 0852) Pulse Rate:  [75-117] 82 (03/22 0852) Resp:  [11-22] 18 (03/22 0852) BP: (144-214)/(83-166) 157/98 (03/22 0852) SpO2:  [96 %-100 %] 99 % (03/22 0852) Weight:  [107 kg] 107 kg (03/21 1527) Neurological Awake alert oriented x 3 Still has mildly reduced attention concentration Speech is not dysarthric No evidence of aphasia Cranial nerves II to XII intact Motor exam with no deficits Sensation intact light touch Coordination exam reveals no dysmetria  General: Awake alert oriented x 2 HEENT: Normocephalic atraumatic Lungs: Clear Cardiovascular: Regular rhythm  Medications  Current Facility-Administered Medications:    acetaminophen (TYLENOL) tablet 650 mg, 650 mg, Oral, Q6H PRN **OR** acetaminophen (TYLENOL) suppository 650 mg, 650 mg, Rectal, Q6H PRN, Dorrell, Robert, MD   amLODipine (NORVASC) tablet 10 mg, 10 mg, Oral, Daily, Dorrell, Robert, MD, 10 mg at 03/01/23 1051   enoxaparin (LOVENOX) injection 40 mg, 40 mg, Subcutaneous, Q24H, Dorrell, Robert, MD, 40 mg at 02/28/23 2200   finasteride (PROSCAR) tablet 5 mg, 5 mg, Oral, Daily, Dorrell, Robert, MD, 5 mg at [id number]   folic acid (FOLVITE) tablet 1 mg, 1 mg, Oral, Daily, Dorrell, Robert, MD, 1 mg at 03/01/23 1052    furosemide (LASIX) tablet 40 mg, 40 mg, Oral, Daily, Dorrell, Robert, MD, 40 mg at 03/01/23 1052   hydrALAZINE (APRESOLINE) injection 10 mg, 10 mg, Intravenous, Q4H PRN, Emilee Hero, MD, 10 mg at 02/28/23 1929   irbesartan (AVAPRO) tablet 300 mg, 300 mg, Oral, Daily, 300 mg at 03/01/23 1052 **AND** hydrochlorothiazide (HYDRODIURIL) tablet 25 mg, 25 mg, Oral, Daily, Jardin, Carla G, RPH, 25 mg at 03/01/23 1052   HYDROcodone-acetaminophen (NORCO/VICODIN) 5-325 MG per tablet 1-2 tablet, 1-2 tablet, Oral, Q4H PRN, Dorrell, Robert, MD   labetalol (NORMODYNE) injection 20 mg, 20 mg, Intravenous, Q2H PRN, Dorrell, Robert, MD   levETIRAcetam (KEPPRA) IVPB 500 mg/100 mL premix, 500 mg, Intravenous, Q12H, Dorrell, Herbie Baltimore, MD, Last Rate: 400 mL/hr at 03/01/23 1120, 500 mg at 03/01/23 1120   LORazepam (ATIVAN) injection 2 mg, 2 mg, Intravenous, Q6H PRN, Emilee Hero, MD   multivitamin with minerals tablet 1 tablet, 1 tablet, Oral, Daily, Dorrell, Robert, MD, 1 tablet at 03/01/23 1052   ondansetron (ZOFRAN) tablet 4 mg, 4 mg, Oral, Q6H PRN **OR** ondansetron (ZOFRAN) injection 4 mg, 4 mg, Intravenous, Q6H PRN, Dorrell, Robert, MD   polyethylene glycol (MIRALAX / GLYCOLAX) packet 17 g, 17 g, Oral, Daily PRN, Dorrell, Robert, MD   potassium chloride SA (KLOR-CON M) CR tablet 40 mEq, 40 mEq, Oral, Q4H, Ghimire, Kuber, MD   rosuvastatin (CRESTOR) tablet 10 mg, 10 mg, Oral, Daily, Dorrell, Robert, MD, 10 mg at 03/01/23 1052   thiamine (VITAMIN  B1) tablet 100 mg, 100 mg, Oral, Daily, 100 mg at 03/01/23 1052 **OR** thiamine (VITAMIN B1) injection 100 mg, 100 mg, Intravenous, Daily, Dorrell, Robert, MD, 100 mg at 02/28/23 1639 Labs CBC    Component Value Date/Time   WBC 11.2 (H) 02/28/2023 1526   RBC 5.01 02/28/2023 1526   HGB 15.6 02/28/2023 1610   HCT 46.0 02/28/2023 1610   PLT 297 02/28/2023 1526   MCV 91.2 02/28/2023 1526   MCH 31.1 02/28/2023 1526   MCHC 34.1 02/28/2023 1526   RDW 13.8 02/28/2023  1526   LYMPHSABS 3.7 02/28/2023 1526   MONOABS 0.6 02/28/2023 1526   EOSABS 0.5 02/28/2023 1526   BASOSABS 0.1 02/28/2023 1526    CMP     Component Value Date/Time   NA 139 03/01/2023 0443   K 2.7 (LL) 03/01/2023 0443   CL 103 03/01/2023 0443   CO2 25 03/01/2023 0443   GLUCOSE 102 (H) 03/01/2023 0443   BUN 7 (L) 03/01/2023 0443   CREATININE 0.96 03/01/2023 0443   CREATININE 0.82 12/13/2022 1013   CALCIUM 9.0 03/01/2023 0443   PROT 8.1 02/28/2023 1526   ALBUMIN 4.4 02/28/2023 1526   AST 32 02/28/2023 1526   ALT 18 02/28/2023 1526   ALKPHOS 83 02/28/2023 1526   BILITOT 1.0 02/28/2023 1526   GFRNONAA >60 03/01/2023 0443     Lipid Panel     Component Value Date/Time   CHOL 169 11/08/2022 0945   TRIG 182 (H) 11/08/2022 0945   HDL 42 11/08/2022 0945   CHOLHDL 4.0 11/08/2022 0945   VLDL 27.8 11/08/2015 0850   LDLCALC 99 11/08/2022 0945     Imaging I have reviewed images in epic and the results pertinent to this consultation are: EEG with right hemispheric dysfunction.  Assessment:  77 year old man who has presented now with a second time with concern for seizures, this time with a witnessed seizure and tongue bite along with urinary incontinence with the seizure semiology described as stiffening of the left arm, shaking of the left arm and leg and then shaking of the whole body followed by loss of consciousness and postictal state with EEG showing right hemispheric slowing. At this time, I would recommend keeping him on antiepileptics He also was hypertensive on both occasions so hypertensive urgency could have played a role but the EEG findings point towards a right hemispheric focus from where the seizure might be emanating. I would still need an MRI of the brain to make sure that there is no structural abnormality.  Impression: Seizure-emanating from the right hemisphere  Recommendations: Continue Keppra 500 twice daily Can discontinue LTM Seizure precautions MRI  brain-will follow Outpatient follow-up with [facility] neurology in 6 to 8 weeks. Blood pressure management per primary team. Plan relayed to Dr. Sloan Leiter   -- Amie Portland, MD Neurologist [facility] Pager: [phone]   [place] Per [facility] statutes, patients with seizures are not allowed to drive until they have been seizure-free for six months.   Use caution when using heavy equipment or power tools. Avoid working on ladders or at heights. Take showers instead of baths. Ensure the water temperature is not too high on the home water heater. Do not go swimming alone. Do not lock yourself in a room alone (i.e. bathroom). When caring for infants or small children, sit down when holding, feeding, or changing them to minimize risk of injury to the child in the event you have a seizure. Maintain good sleep hygiene. Avoid alcohol.  If patient has another seizure, call 911 and bring them back to the ED if: A.  The seizure lasts longer than 5 minutes.      B.  The patient doesn't wake shortly after the seizure or has new problems such as difficulty seeing, speaking or moving following the seizure C.  The patient was injured during the seizure D.  The patient has a temperature over 102 F (39C) E.  The patient vomited during the seizure and now is having trouble breathing  ADDENDUM MRI completed-punctate restricted diffusion in the right hippocampus-likely related to seizure. No stroke workup necessary Continue Keppra and seizure precautions as documented in the progress note from earlier in the day Plan relayed to Dr. Sloan Leiter . Outpatient follow-up in 6-8  weeks with [place] neurology     -- Amie Portland, MD Neurologist [facility] Pager: [phone]

## 2023-03-01 NOTE — Progress Notes (Signed)
[facility] night cross cover note:   I was notified by RN that the patient's potassium level this morning is 2.7.  This is relative to most recent prior value of 3.0 yesterday.  I subsequently placed orders for potassium chloride 40 mill equivalents p.o. every 4 hours x 2 doses and added on a serum magnesium level.    Babs Bertin, DO Hospitalist

## 2023-03-01 NOTE — Plan of Care (Deleted)
MRI completed-punctate restricted diffusion in the right hippocampus-likely related to seizure. No stroke workup necessary Continue Keppra and seizure precautions as documented in the progress note from earlier in the day Plan relayed to Dr. Sloan Leiter . Outpatient follow-up in 4 to 6 weeks with [facility] neurology   -- Amie Portland, MD Neurologist [facility] Pager: [phone]

## 2023-03-01 NOTE — Progress Notes (Signed)
LTM EEG disconnected - no skin breakdown at unhook. [facility] notified.  

## 2023-03-01 NOTE — Progress Notes (Signed)
PROGRESS NOTE    Edward Wolf  [id number] DOB: 1946-01-22 DOA: 02/28/2023 PCP: Wardell Honour, MD    Brief Narrative:  77 year old with history of hypertension, hyperlipidemia, chronic hypokalemia presented to the emergency room with possible seizure-like activity and altered mental status.  Patient woke up from sleep with left-sided headache, woke up with bitten tongue with bleeding from mouth, witnessed seizure-like activity with contraction of his left upper extremity towards his body as well as shaking of his left upper and lower extremity, developed generalized seizure-like activity.  Urinary incontinence.  EMS was called.  He was noted to have left-sided weakness as well as postictal confusion and severe headaches.  Code stroke was activated.  He was brought to the ER.   Assessment & Plan:   Seizure disorder: Witnessed seizure with postictal confusion. EEG showed continuous right spike, there was slowing now.  Loaded with Keppra and continued on Keppra. MRI pending to look for any structural abnormality.  Clinically improving. Seizure precautions.  Driving restrictions explained. Patient will be discharged home on Keppra 500 mg twice daily. Outpatient neurology follow-up.  Acute encephalopathy likely secondary to postictal state.  Improved.  Essential hypertension with hypertensive urgency: Continued amlodipine, irbesartan.  Discontinue hydrochlorothiazide as patient is also on Lasix and has severe hypokalemia.  Blood pressures are better today.  Will discontinue hydrochlorothiazide on discharge.  Severe hypokalemia: Recurrent and chronic.  On hydrochlorothiazide as well as diuretics with Lasix.  Continue Lasix.  Replaced aggressively.  Magnesium is adequate.  Continue potassium 20 mill equivalent on discharge.  BPH with urinary retention: Successful voiding trial.  Continue finasteride.   DVT prophylaxis: enoxaparin (LOVENOX) injection 40 mg Start: 02/28/23 2200 SCDs  Start: 02/28/23 1850   Code Status: Full code Family Communication: Wife at the bedside Disposition Plan: Status is: Inpatient Remains inpatient appropriate because: Severe electrolyte abnormalities, seizure disorder, LTM, MRI     Consultants:  Neurology  Procedures:  EEG  Antimicrobials:  None   Subjective: Patient seen and examined.  He is much better.  Walking around.  Wife at the bedside.  In the morning rounds, he was wondering about catheter coming out.  No more weakness on the left side.  Objective: Vitals:   02/28/23 2339 03/01/23 0259 03/01/23 0852 03/01/23 1235  BP: (!) 147/89 (!) 150/83 (!) 157/98 (!) 134/100  Pulse: 91 75 82 73  Resp: 20 18 18 18   Temp: 98.5 F (36.9 C) 97.9 F (36.6 C) 98.7 F (37.1 C) 98.8 F (37.1 C)  TempSrc: Oral  Oral Oral  SpO2: 100% 100% 99% 100%  Weight:      Height:        Intake/Output Summary (Last 24 hours) at 03/01/2023 1359 Last data filed at 03/01/2023 0758 Gross per 24 hour  Intake 122.35 ml  Output 1100 ml  Net -977.65 ml   Filed Weights   02/28/23 1527  Weight: 107 kg    Examination:  General exam: Appears calm and comfortable  Respiratory system: No added sounds. Cardiovascular system: S1 & S2 heard, RRR.  No pedal edema. Gastrointestinal system: Soft.  Nontender.  Bowel sound present. Central nervous system: Alert and oriented. No focal neurological deficits. Extremities: Symmetric 5 x 5 power. Skin: No rashes, lesions or ulcers Psychiatry: Judgement and insight appear normal. Mood & affect appropriate.     Data Reviewed: I have personally reviewed following labs and imaging studies  CBC: Recent Labs  Lab 02/28/23 1526 02/28/23 1610  WBC 11.2*  --   NEUTROABS  6.4  --   HGB 15.6 15.6  HCT 45.7 46.0  MCV 91.2  --   PLT 297  --    Basic Metabolic Panel: Recent Labs  Lab 02/28/23 1526 02/28/23 1610 03/01/23 0443  NA 140 143 139  K 3.0* 2.7* 2.7*  CL 103 102 103  CO2 20*  --  25   GLUCOSE 129* 126* 102*  BUN 7* 8 7*  CREATININE 1.01 0.90 0.96  CALCIUM 9.1  --  9.0  MG  --   --  2.1   GFR: Estimated Creatinine Clearance: 85.3 mL/min (by C-G formula based on SCr of 0.96 mg/dL). Liver Function Tests: Recent Labs  Lab 02/28/23 1526  AST 32  ALT 18  ALKPHOS 83  BILITOT 1.0  PROT 8.1  ALBUMIN 4.4   No results for input(s): "LIPASE", "AMYLASE" in the last 168 hours. No results for input(s): "AMMONIA" in the last 168 hours. Coagulation Profile: Recent Labs  Lab 02/28/23 1526  INR 1.0   Cardiac Enzymes: No results for input(s): "CKTOTAL", "CKMB", "CKMBINDEX", "TROPONINI" in the last 168 hours. BNP (last 3 results) No results for input(s): "PROBNP" in the last 8760 hours. HbA1C: No results for input(s): "HGBA1C" in the last 72 hours. CBG: Recent Labs  Lab 02/28/23 1523  GLUCAP 127*   Lipid Profile: No results for input(s): "CHOL", "HDL", "LDLCALC", "TRIG", "CHOLHDL", "LDLDIRECT" in the last 72 hours. Thyroid Function Tests: No results for input(s): "TSH", "T4TOTAL", "FREET4", "T3FREE", "THYROIDAB" in the last 72 hours. Anemia Panel: No results for input(s): "VITAMINB12", "FOLATE", "FERRITIN", "TIBC", "IRON", "RETICCTPCT" in the last 72 hours. Sepsis Labs: No results for input(s): "PROCALCITON", "LATICACIDVEN" in the last 168 hours.  No results found for this or any previous visit (from the past 240 hour(s)).       Radiology Studies: Overnight EEG with video  Result Date: 03/01/2023 Lora Havens, MD     03/01/2023  7:43 AM Patient Name: Edward Wolf MRN: [id number] Epilepsy Attending: Lora Havens Referring Physician/Provider: Katy Apo, NP Duration: 02/28/2023 1615 to 03/01/2023 0730 Patient history: 77 year old patient with history of hyperlipidemia, hypertension and BPH presents with seizure activity, headache and left-sided weakness. EEG to evaluate for seizure Level of alertness: Awake, asleep AEDs during EEG study: LEV  Technical aspects: This EEG study was done with scalp electrodes positioned according to the 10-20 International system of electrode placement. Electrical activity was reviewed with band pass filter of 1-70Hz , sensitivity of 7 uV/mm, display speed of 85mm/sec with a 60Hz  notched filter applied as appropriate. EEG data were recorded continuously and digitally stored.  Video monitoring was available and reviewed as appropriate. Description: The posterior dominant rhythm consists of 9 Hz activity of moderate voltage (25-35 uV) seen predominantly in posterior head regions, symmetric and reactive to eye opening and eye closing. Sleep was characterized by vertex waves, sleep spindles (12 to 14 Hz), maximal frontocentral region. EEG showed continuous low amplitude 2- 5 hz theta-delta slowing in right hemisphere. Hyperventilation and photic stimulation were not performed.   ABNORMALITY - Continuous slow, right hemisphere  IMPRESSION: This study is is suggestive of cortical dysfunction arising from right hemisphere likely secondary to underlying structural abnormality, post-ictal state. No seizures or epileptiform discharges were seen during this study. Lora Havens   CT HEAD CODE STROKE WO CONTRAST  Result Date: 02/28/2023 CLINICAL DATA:  Code stroke.  Seizure EXAM: CT HEAD WITHOUT CONTRAST TECHNIQUE: Contiguous axial images were obtained from the base of the skull through  the vertex without intravenous contrast. RADIATION DOSE REDUCTION: This exam was performed according to the departmental dose-optimization program which includes automated exposure control, adjustment of the mA and/or kV according to patient size and/or use of iterative reconstruction technique. COMPARISON:  12/07/2022 CT head FINDINGS: Brain: No evidence of acute infarction, hemorrhage, mass, mass effect, or midline shift. No hydrocephalus or extra-axial collection. Periventricular white matter changes, likely the sequela of chronic small vessel  ischemic disease. Vascular: No hyperdense vessel. Skull: Negative for fracture or focal lesion. Sinuses/Orbits: Mucosal thickening in the left frontal sinus. No acute finding in the orbits. Other: The mastoid air cells are well aerated. ASPECTS [facility] Stroke Program Early CT Score) - Ganglionic level infarction (caudate, lentiform nuclei, internal capsule, insula, M1-M3 cortex): 7 - Supraganglionic infarction (M4-M6 cortex): 3 Total score (0-10 with 10 being normal): 10 IMPRESSION: 1. No acute intracranial process. 2. ASPECTS is 10. Imaging results were communicated on 02/28/2023 at 3:47 pm to provider Dr. Rory Percy via secure text paging. Electronically Signed   By: Merilyn Baba M.D.   On: 02/28/2023 15:48        Scheduled Meds:  amLODipine  10 mg Oral Daily   enoxaparin (LOVENOX) injection  40 mg Subcutaneous Q24H   finasteride  5 mg Oral Daily   folic acid  1 mg Oral Daily   furosemide  40 mg Oral Daily   irbesartan  300 mg Oral Daily   multivitamin with minerals  1 tablet Oral Daily   potassium chloride  40 mEq Oral Q4H   rosuvastatin  10 mg Oral Daily   thiamine  100 mg Oral Daily   Or   thiamine  100 mg Intravenous Daily   Continuous Infusions:  levETIRAcetam 500 mg (03/01/23 1120)     LOS: 1 day    Time spent: 35 minutes     Barb Merino, MD [facility] Pager [phone]

## 2023-03-01 NOTE — Procedures (Addendum)
Patient Name: Edward Wolf  MRN: [id number]  Epilepsy Attending: Lora Havens  Referring Physician/Provider: Katy Apo, NP  Duration: 02/28/2023 1615 to 03/01/2023 1331  Patient history: 77 year old patient with history of hyperlipidemia, hypertension and BPH presents with seizure activity, headache and left-sided weakness. EEG to evaluate for seizure  Level of alertness: Awake, asleep  AEDs during EEG study: LEV  Technical aspects: This EEG study was done with scalp electrodes positioned according to the 10-20 International system of electrode placement. Electrical activity was reviewed with band pass filter of 1-70Hz , sensitivity of 7 uV/mm, display speed of 72mm/sec with a 60Hz  notched filter applied as appropriate. EEG data were recorded continuously and digitally stored.  Video monitoring was available and reviewed as appropriate.  Description: The posterior dominant rhythm consists of 9 Hz activity of moderate voltage (25-35 uV) seen predominantly in posterior head regions, symmetric and reactive to eye opening and eye closing. Sleep was characterized by vertex waves, sleep spindles (12 to 14 Hz), maximal frontocentral region. EEG showed continuous low amplitude 2- 5 hz theta-delta slowing in right hemisphere. Hyperventilation and photic stimulation were not performed.     ABNORMALITY - Continuous slow, right hemisphere   IMPRESSION: This study is is suggestive of cortical dysfunction arising from right hemisphere likely secondary to underlying structural abnormality, post-ictal state. No seizures or epileptiform discharges were seen during this study.   Edward Wolf [name]

## 2023-03-02 DIAGNOSIS — I161 Hypertensive emergency: Secondary | ICD-10-CM | POA: Diagnosis not present

## 2023-03-02 LAB — BASIC METABOLIC PANEL
Anion gap: 8 (ref 5–15)
BUN: 11 mg/dL (ref 8–23)
CO2: 27 mmol/L (ref 22–32)
Calcium: 9.2 mg/dL (ref 8.9–10.3)
Chloride: 105 mmol/L (ref 98–111)
Creatinine, Ser: 0.98 mg/dL (ref 0.61–1.24)
GFR, Estimated: >60 mL/min (ref >60)
Glucose, Bld: 106 mg/dL — ABNORMAL HIGH (ref 70–99)
Potassium: 3.4 mmol/L — ABNORMAL LOW (ref 3.5–5.1)
Sodium: 140 mmol/L (ref 135–145)

## 2023-03-02 LAB — MAGNESIUM: Magnesium: 1.9 mg/dL (ref 1.7–2.4)

## 2023-03-02 MED ORDER — LEVETIRACETAM 500 MG PO TABS
500.0000 mg | ORAL_TABLET | Freq: Two times a day (BID) | ORAL | Status: DC
  Administered 2023-03-02 – 2023-03-03 (×3): 500 mg via ORAL
  Filled 2023-03-02 (×3): qty 1

## 2023-03-02 MED ORDER — HYDRALAZINE HCL 20 MG/ML IJ SOLN
10.0000 mg | Freq: Once | INTRAMUSCULAR | Status: AC
  Administered 2023-03-02: 10 mg via INTRAVENOUS
  Filled 2023-03-02: qty 1

## 2023-03-02 MED ORDER — TAMSULOSIN HCL 0.4 MG PO CAPS
0.4000 mg | ORAL_CAPSULE | Freq: Every day | ORAL | Status: DC
  Administered 2023-03-02: 0.4 mg via ORAL
  Filled 2023-03-02: qty 1

## 2023-03-02 MED ORDER — HYDRALAZINE HCL 25 MG PO TABS
25.0000 mg | ORAL_TABLET | Freq: Three times a day (TID) | ORAL | Status: DC
  Administered 2023-03-02 – 2023-03-03 (×3): 25 mg via ORAL
  Filled 2023-03-02 (×3): qty 1

## 2023-03-02 NOTE — Hospital Course (Signed)
PMH of HTN, HLD, hypokalemia present to the hospital with complaints of a seizure-like event.  This is the second event first 1 was in December 2023. Patient had some complaint of left-sided headache during the day.  Took some Tylenol without any improvement.  After 2 hours started with a staring spell which 22 tonic-clonic activity with tongue bite which lasted for a few minutes.  EMS was called and.  Patient had some left-sided weakness and postictal confusion.  Neurology was consulted currently on AED.

## 2023-03-02 NOTE — Progress Notes (Signed)
[facility] Progress Note Patient: Edward Wolf [id number] DOB: Jan 19, 1946 DOA: 02/28/2023  DOS: the patient was seen and examined on 03/02/2023  Brief hospital course: PMH of HTN, HLD, hypokalemia present to the hospital with complaints of a seizure-like event.  This is the second event first 1 was in December 2023. Patient had some complaint of left-sided headache during the day.  Took some Tylenol without any improvement.  After 2 hours started with a staring spell which 22 tonic-clonic activity with tongue bite which lasted for a few minutes.  EMS was called and.  Patient had some left-sided weakness and postictal confusion.  Neurology was consulted currently on AED. Assessment and Plan: Seizure disorder. 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 EEG was performed which does show right hemispheric focus. MRI brain with and without contrast negative for any acute abnormality but does show evidence of punctate restricted diffusion in the right hippocampal area which likely is because of the seizure instead of cause of the seizure per neurology. Less likely stroke. Currently on Keppra. Recommendation for seizure precaution. Follow-up with outpatient in 6 to 8 weeks with [facility].  Hypertensive emergency. Blood pressure was significantly elevated at the time of admission. Pressure continues remain elevated therefore we will adjust medications and monitor.  Questionable volume overload history Family reported history of volume overload. Patient is on HCTZ and Lasix at the same time. Will discontinue one of them on discharge. Monitor for now.  Chronic hypokalemia severe. Replace orally. Monitor.  Headache. Etiology not clear. Suspect this is mostly tension-like headache. Monitor.   Subjective: Reported severe headache in the morning.  Nausea or vomiting but improving with Norco.  No fever no chills.  Physical Exam: Clear to auscultation. S1-S2 present. Bowel sound present No  neurological deficit.  Data Reviewed: I have Reviewed nursing notes, Vitals, and Lab results. Since last encounter, pertinent lab results CBC and BMP   . I have ordered test including CBC and BMP  . I have discussed pt's care plan and test results with neuro G  .   Disposition: Status is: Inpatient Remains inpatient appropriate because: Need blood pressure control  enoxaparin (LOVENOX) injection 40 mg Start: 02/28/23 2200 SCDs Start: 02/28/23 1850   Family Communication: Wife at bedside Level of care: Progressive   Vitals:   03/02/23 1023 03/02/23 1149 03/02/23 1322 03/02/23 1607  BP: (!) 161/105 (!) 131/96 133/89 127/80  Pulse:  80  92  Resp:  20  19  Temp:  98.3 F (36.8 C)  98 F (36.7 C)  TempSrc:  Oral  Oral  SpO2:  100%  100%  Weight:      Height:         Author: Berle Mull, MD 03/02/2023 5:56 PM  Please look on [email or web address] to find out who is on call.

## 2023-03-03 DIAGNOSIS — I161 Hypertensive emergency: Secondary | ICD-10-CM | POA: Diagnosis not present

## 2023-03-03 LAB — CBC WITH DIFFERENTIAL/PLATELET
Abs Immature Granulocytes: 0.02 10*3/uL (ref 0.00–0.07)
Basophils Absolute: 0 10*3/uL (ref 0.0–0.1)
Basophils Relative: 0 %
Eosinophils Absolute: 0.6 10*3/uL — ABNORMAL HIGH (ref 0.0–0.5)
Eosinophils Relative: 8 %
HCT: 42.5 % (ref 39.0–52.0)
Hemoglobin: 14.6 g/dL (ref 13.0–17.0)
Immature Granulocytes: 0 %
Lymphocytes Relative: 30 %
Lymphs Abs: 2.3 10*3/uL (ref 0.7–4.0)
MCH: 30.8 pg (ref 26.0–34.0)
MCHC: 34.4 g/dL (ref 30.0–36.0)
MCV: 89.7 fL (ref 80.0–100.0)
Monocytes Absolute: 0.5 10*3/uL (ref 0.1–1.0)
Monocytes Relative: 7 %
Neutro Abs: 4.1 10*3/uL (ref 1.7–7.7)
Neutrophils Relative %: 55 %
Platelets: 242 10*3/uL (ref 150–400)
RBC: 4.74 MIL/uL (ref 4.22–5.81)
RDW: 13.8 % (ref 11.5–15.5)
WBC: 7.6 10*3/uL (ref 4.0–10.5)
nRBC: 0 % (ref 0.0–0.2)

## 2023-03-03 LAB — COMPREHENSIVE METABOLIC PANEL
ALT: 16 U/L (ref 0–44)
AST: 18 U/L (ref 15–41)
Albumin: 4 g/dL (ref 3.5–5.0)
Alkaline Phosphatase: 69 U/L (ref 38–126)
Anion gap: 9 (ref 5–15)
BUN: 11 mg/dL (ref 8–23)
CO2: 25 mmol/L (ref 22–32)
Calcium: 9.3 mg/dL (ref 8.9–10.3)
Chloride: 105 mmol/L (ref 98–111)
Creatinine, Ser: 0.94 mg/dL (ref 0.61–1.24)
GFR, Estimated: >60 mL/min (ref >60)
Glucose, Bld: 107 mg/dL — ABNORMAL HIGH (ref 70–99)
Potassium: 3.5 mmol/L (ref 3.5–5.1)
Sodium: 139 mmol/L (ref 135–145)
Total Bilirubin: 1.2 mg/dL (ref 0.3–1.2)
Total Protein: 7.3 g/dL (ref 6.5–8.1)

## 2023-03-03 LAB — MAGNESIUM: Magnesium: 1.9 mg/dL (ref 1.7–2.4)

## 2023-03-03 MED ORDER — FUROSEMIDE 20 MG PO TABS: 40.0000 mg | ORAL_TABLET | Freq: Every day | ORAL | 1 refills | Status: DC | PRN

## 2023-03-03 MED ORDER — NAPROXEN SODIUM 220 MG PO TABS: 220.0000 mg | ORAL_TABLET | Freq: Every day | ORAL | Status: AC | PRN

## 2023-03-03 MED ORDER — HYDRALAZINE HCL 25 MG PO TABS: 25.0000 mg | ORAL_TABLET | Freq: Three times a day (TID) | ORAL | 0 refills | Status: DC

## 2023-03-03 MED ORDER — HYDROCODONE-ACETAMINOPHEN 5-325 MG PO TABS: 1.0000 | ORAL_TABLET | Freq: Two times a day (BID) | ORAL | 0 refills | Status: DC | PRN

## 2023-03-03 MED ORDER — VALSARTAN-HYDROCHLOROTHIAZIDE 320-25 MG PO TABS: 1.0000 | ORAL_TABLET | Freq: Every day | ORAL | 0 refills | Status: DC

## 2023-03-03 NOTE — Plan of Care (Signed)
  Problem: Education: Goal: Knowledge of General Education information will improve Description: Including pain rating scale, medication(s)/side effects and non-pharmacologic comfort measures Outcome: Adequate for Discharge   Problem: Health Behavior/Discharge Planning: Goal: Ability to manage health-related needs will improve Outcome: Adequate for Discharge   Problem: Clinical Measurements: Goal: Ability to maintain clinical measurements within normal limits will improve Outcome: Adequate for Discharge Goal: Will remain free from infection Outcome: Adequate for Discharge Goal: Diagnostic test results will improve Outcome: Adequate for Discharge Goal: Respiratory complications will improve Outcome: Adequate for Discharge Goal: Cardiovascular complication will be avoided Outcome: Adequate for Discharge   Problem: Activity: Goal: Risk for activity intolerance will decrease Outcome: Adequate for Discharge   Problem: Nutrition: Goal: Adequate nutrition will be maintained Outcome: Adequate for Discharge   Problem: Coping: Goal: Level of anxiety will decrease Outcome: Adequate for Discharge   Problem: Elimination: Goal: Will not experience complications related to bowel motility Outcome: Adequate for Discharge Goal: Will not experience complications related to urinary retention Outcome: Adequate for Discharge   Problem: Pain Managment: Goal: General experience of comfort will improve Outcome: Adequate for Discharge   Problem: Safety: Goal: Ability to remain free from injury will improve Outcome: Adequate for Discharge   

## 2023-03-03 NOTE — Discharge Instructions (Signed)
Per [place] DMV statutes, patients with seizures are not allowed to drive until  they have been seizure-free for six months. Use caution when using heavy equipment or power tools. Avoid working on ladders or at heights. Take showers instead of baths. Ensure the water temperature is not too high on the home water heater. Do not go swimming alone. When caring for infants or small children, sit down when holding, feeding, or changing them to minimize risk of injury to the child in the event you have a seizure.  Maintain good sleep hygiene. Avoid alcohol.  

## 2023-03-04 ENCOUNTER — Telehealth: Payer: Self-pay

## 2023-03-04 NOTE — Transitions of Care (Post Inpatient/ED Visit) (Signed)
   03/04/2023  Name: Edward Wolf MRN: [id number] DOB: March 27, 1946  Today's TOC FU Call Status: Today's TOC FU Call Status:: Successful TOC FU Call Competed TOC FU Call Complete Date: 03/04/23  Transition Care Management Follow-up Telephone Call Date of Discharge: 03/03/23 Discharge [facility]) Type of Discharge: Inpatient Admission How have you been since you were released from the hospital?: Better Any questions or concerns?: Yes  Items Reviewed: Did you receive and understand the discharge instructions provided?: Yes Medications obtained and verified?: Yes (Medications Reviewed) Any new allergies since your discharge?: No Dietary orders reviewed?: No Do you have support at home?: Yes People in Home: spouse Name of Support/Comfort Primary Source: [facility] and Equipment/Supplies: [place] Ordered?: No Any new equipment or medical supplies ordered?: No  Functional Questionnaire: Do you need assistance with bathing/showering or dressing?: No Do you need assistance with meal preparation?: No Do you need assistance with eating?: No Do you have difficulty maintaining continence: No Do you need assistance with getting out of bed/getting out of a chair/moving?: No Do you have difficulty managing or taking your medications?: No  Follow up appointments reviewed: PCP Follow-up appointment confirmed?: Yes Date of PCP follow-up appointment?: 03/06/23 Follow-up Provider: Marlowe Sax, NP [facility] Follow-up appointment confirmed?: No Do you need transportation to your follow-up appointment?: No Do you understand care options if your condition(s) worsen?: Yes-patient verbalized understanding    SIGNATURE: [place].D/RMA

## 2023-03-05 NOTE — Discharge Summary (Signed)
Physician Discharge Summary   Patient: Edward Wolf MRN: [id number] DOB: 1946/09/21  Admit date:     02/28/2023  Discharge date: 03/03/2023  Discharge Physician: Berle Mull  PCP: Wardell Honour, MD  Recommendations at discharge: Follow-up with PCP in 1 week. Follow-up with neurology as recommended.   Follow-up Information     Wardell Honour, MD. Schedule an appointment as soon as possible for a visit in 1 week(s).   Specialties: Family Medicine, Emergency Medicine Why: adjust BP meds Contact information: [place] [address] [phone]         [facility] Follow up.   Specialty: Neurology Contact information: [address] [place] [phone]               Discharge Diagnoses: Principal Problem:   Hypertensive emergency Active Problems:   CVA (cerebral vascular accident) [facility])  Hospital Course: PMH of HTN, HLD, hypokalemia present to the hospital with complaints of a seizure-like event.  This is the second event first 1 was in December 2023. Patient had some complaint of left-sided headache during the day.  Took some Tylenol without any improvement.  After 2 hours started with a staring spell which 22 tonic-clonic activity with tongue bite which lasted for a few minutes.  EMS was called and.  Patient had some left-sided weakness and postictal confusion.  Neurology was consulted currently on AED. Assessment and Plan  Seizure disorder. 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 EEG was performed which does show right hemispheric focus. MRI brain with and without contrast negative for any acute abnormality but does show evidence of punctate restricted diffusion in the right hippocampal area which likely is because of the seizure instead of cause of the seizure per neurology. Less likely stroke. Currently on Keppra. Recommendation for seizure precaution. Follow-up with outpatient in 6  to 8 weeks with [facility].   Hypertensive emergency. Blood pressure was significantly high at the time of admission. Improving with change in the therapy. Outpatient follow-up with PCP recommended.   Questionable volume overload history Family reported history of volume overload. Patient is on HCTZ and Lasix at the same time. Will discontinue one of them on discharge. Monitor for now.   Chronic hypokalemia severe. Replace orally. Recheck in 1 week.  Monitor.   Headache. Etiology not clear. Suspect this is mostly tension-like headache. Monitor.   Consultants:  Neurology  Procedures performed:  EEG  DISCHARGE MEDICATION: Allergies as of 03/03/2023   No Known Allergies      Medication List     STOP taking these medications    acetaminophen-codeine 300-30 MG tablet Commonly known as: TYLENOL #3   sildenafil 100 MG tablet Commonly known as: VIAGRA       TAKE these medications    acetaminophen 500 MG tablet Commonly known as: TYLENOL Take 500 mg by mouth 2 (two) times daily as needed for mild pain.   albuterol 108 (90 Base) MCG/ACT inhaler Commonly known as: VENTOLIN HFA Inhale 1 puff into the lungs every 6 (six) hours as needed for wheezing or shortness of breath.   allopurinol 300 MG tablet Commonly known as: ZYLOPRIM TAKE 1 TABLET BY MOUTH EVERY DAY   amLODipine 10 MG tablet Commonly known as: NORVASC Take 10 mg by mouth daily.   finasteride 5 MG tablet Commonly known as: PROSCAR Take 5 mg by mouth daily.   furosemide 20 MG tablet Commonly known as: LASIX Take 2 tablets (40 mg total) by  mouth daily as needed (for weight gain of > 3lbs in 1 day or >5 lbs in 1 week). What changed:  when to take this reasons to take this   hydrALAZINE 25 MG tablet Commonly known as: APRESOLINE Take 1 tablet (25 mg total) by mouth 3 (three) times daily.   HYDROcodone-acetaminophen 5-325 MG tablet Commonly known as: NORCO/VICODIN Take 1 tablet by mouth 2 (two) times  daily as needed for moderate pain or severe pain.   levETIRAcetam 500 MG tablet Commonly known as: Keppra Take 1 tablet (500 mg total) by mouth 2 (two) times daily.   minocycline 100 MG capsule Commonly known as: MINOCIN TAKE 1 CAPSULE (100 MG TOTAL) BY MOUTH DAILY AS NEEDED (URINARY PROBLEMS).   multivitamin tablet Take 1 tablet by mouth every other day.   naproxen sodium 220 MG tablet Commonly known as: Aleve Take 1 tablet (220 mg total) by mouth daily as needed for up to 7 days (headache).   potassium chloride SA 20 MEQ tablet Commonly known as: KLOR-CON M Take 1 tablet (20 mEq total) by mouth daily. What changed: when to take this   rosuvastatin 10 MG tablet Commonly known as: CRESTOR Take 1 tablet (10 mg total) by mouth daily.   tamsulosin 0.4 MG Caps capsule Commonly known as: FLOMAX Take 0.4 mg by mouth daily after supper.   valsartan-hydrochlorothiazide 320-25 MG tablet Commonly known as: DIOVAN-HCT Take 1 tablet by mouth daily.       Disposition: Home Diet recommendation: Cardiac diet  Discharge Exam: Vitals:   03/02/23 2015 03/02/23 2357 03/03/23 0313 03/03/23 0845  BP: 132/89 (!) 149/86 (!) 161/97 (!) 160/96  Pulse: 84 87 80 95  Resp: 14 (!) 22 (!) 22 17  Temp: 98.3 F (36.8 C) 98.4 F (36.9 C) 98.2 F (36.8 C) 98.2 F (36.8 C)  TempSrc: Oral Oral Oral Oral  SpO2: 100% 100% 97% 100%  Weight:      Height:       General: Appear in no distress; no visible Abnormal Neck Mass Or lumps, Conjunctiva normal Cardiovascular: S1 and S2 Present, no Murmur, Respiratory: good respiratory effort, Bilateral Air entry present and CTA, no Crackles, no wheezes Abdomen: Bowel Sound present, Non tender  Extremities: no Pedal edema Neurology: alert and oriented to time, place, and person  Filed Weights   02/28/23 1527  Weight: 107 kg   Condition at discharge: stable  The results of significant diagnostics from this hospitalization (including imaging,  microbiology, ancillary and laboratory) are listed below for reference.   Imaging Studies: MR BRAIN W WO CONTRAST  Result Date: 03/01/2023 CLINICAL DATA:  Seizure disorder, clinical change. EXAM: MRI HEAD WITHOUT AND WITH CONTRAST TECHNIQUE: Multiplanar, multiecho pulse sequences of the brain and surrounding structures were obtained without and with intravenous contrast. CONTRAST:  59mL GADAVIST GADOBUTROL 1 MMOL/ML IV SOLN COMPARISON:  CT head 1 day prior, brain MRI 12/07/2022 FINDINGS: Brain: There is a punctate focus of diffusion restriction in the right hippocampus (2-24). There is no diffusion restriction. There is no acute intracranial hemorrhage or extra-axial fluid collection. Background parenchymal volume is within expected limits for age. The ventricles are stable in size. Patchy and confluent FLAIR signal abnormality in the supratentorial white matter likely reflects sequela of moderate chronic small-vessel ischemic change, stable since the prior brain MRI. 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 There is no new, suspicious, or acute parenchymal signal abnormality. The hippocampi are normal in signal and architecture. The pituitary and suprasellar region  are normal. There is no mass lesion or abnormal enhancement. There is no mass effect or midline shift. Vascular: Normal flow voids. Skull and upper cervical spine: Normal marrow signal. Sinuses/Orbits: The paranasal sinuses are clear. The globes and orbits are unremarkable. Other: None. IMPRESSION: 1. Punctate focus of diffusion restriction in the right hippocampus may reflect a small acute infarct or seizure-related change. 2. No other acute intracranial pathology or significant change since the prior brain MRI from 12/07/2022. Electronically Signed   By: Valetta Mole M.D.   On: 03/01/2023 16:20   Overnight EEG with video  Result Date: 03/01/2023 Lora Havens, MD     03/01/2023  5:01 PM Patient Name: Edward Wolf MRN: [id number]  Epilepsy Attending: Lora Havens Referring Physician/Provider: Katy Apo, NP Duration: 02/28/2023 1615 to 03/01/2023 1331 Patient history: 77 year old patient with history of hyperlipidemia, hypertension and BPH presents with seizure activity, headache and left-sided weakness. EEG to evaluate for seizure Level of alertness: Awake, asleep AEDs during EEG study: LEV Technical aspects: This EEG study was done with scalp electrodes positioned according to the 10-20 International system of electrode placement. Electrical activity was reviewed with band pass filter of 1-70Hz , sensitivity of 7 uV/mm, display speed of 30mm/sec with a 60Hz  notched filter applied as appropriate. EEG data were recorded continuously and digitally stored.  Video monitoring was available and reviewed as appropriate. Description: The posterior dominant rhythm consists of 9 Hz activity of moderate voltage (25-35 uV) seen predominantly in posterior head regions, symmetric and reactive to eye opening and eye closing. Sleep was characterized by vertex waves, sleep spindles (12 to 14 Hz), maximal frontocentral region. EEG showed continuous low amplitude 2- 5 hz theta-delta slowing in right hemisphere. Hyperventilation and photic stimulation were not performed.   ABNORMALITY - Continuous slow, right hemisphere  IMPRESSION: This study is is suggestive of cortical dysfunction arising from right hemisphere likely secondary to underlying structural abnormality, post-ictal state. No seizures or epileptiform discharges were seen during this study. Lora Havens   CT HEAD CODE STROKE WO CONTRAST  Result Date: 02/28/2023 CLINICAL DATA:  Code stroke.  Seizure EXAM: CT HEAD WITHOUT CONTRAST TECHNIQUE: Contiguous axial images were obtained from the base of the skull through the vertex without intravenous contrast. RADIATION DOSE REDUCTION: This exam was performed according to the departmental dose-optimization program which includes automated  exposure control, adjustment of the mA and/or kV according to patient size and/or use of iterative reconstruction technique. COMPARISON:  12/07/2022 CT head FINDINGS: Brain: No evidence of acute infarction, hemorrhage, mass, mass effect, or midline shift. No hydrocephalus or extra-axial collection. 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 Vascular: No hyperdense vessel. Skull: Negative for fracture or focal lesion. Sinuses/Orbits: Mucosal thickening in the left frontal sinus. No acute finding in the orbits. Other: The mastoid air cells are well aerated. ASPECTS [facility] Stroke Program Early CT Score) - Ganglionic level infarction (caudate, lentiform nuclei, internal capsule, insula, M1-M3 cortex): 7 - Supraganglionic infarction (M4-M6 cortex): 3 Total score (0-10 with 10 being normal): 10 IMPRESSION: 1. No acute intracranial process. 2. ASPECTS is 10. Imaging results were communicated on 02/28/2023 at 3:47 pm to provider Dr. Rory Percy via secure text paging. Electronically Signed   By: Merilyn Baba M.D.   On: 02/28/2023 15:48    Microbiology: Results for orders placed or performed during the hospital encounter of 07/18/21  Urine Culture     Status: None   Collection Time: 07/18/21  9:05 AM   Specimen: Urine, Clean Catch  Result Value Ref Range Status   Specimen Description   Final    URINE, CLEAN CATCH Performed at [facility], [place] [address], [place], [place] [address]    Special Requests   Final    NONE Performed at [facility], [place] [address] [place], [place] [address]    Culture   Final    NO GROWTH Performed at [facility], [place] [address], [place], [place] [address]    Report Status 07/19/2021 FINAL  Final   Labs: CBC: Recent Labs  Lab 02/28/23 1526 02/28/23 1610 03/03/23 0815  WBC 11.2*  --  7.6  NEUTROABS 6.4  --  4.1  HGB 15.6 15.6 14.6  HCT 45.7 46.0 42.5  MCV 91.2  --  89.7   PLT 297  --  [id number]   Basic Metabolic Panel: Recent Labs  Lab 02/28/23 1526 02/28/23 1610 03/01/23 0443 03/01/23 1641 03/02/23 0902 03/03/23 0815  NA 140 143 139  --  140 139  K 3.0* 2.7* 2.7* 2.9* 3.4* 3.5  CL 103 102 103  --  105 105  CO2 20*  --  25  --  27 25  GLUCOSE 129* 126* 102*  --  106* 107*  BUN 7* 8 7*  --  11 11  CREATININE 1.01 0.90 0.96  --  0.98 0.94  CALCIUM 9.1  --  9.0  --  9.2 9.3  MG  --   --  2.1  --  1.9 1.9   Liver Function Tests: Recent Labs  Lab 02/28/23 1526 03/03/23 0815  AST 32 18  ALT 18 16  ALKPHOS 83 69  BILITOT 1.0 1.2  PROT 8.1 7.3  ALBUMIN 4.4 4.0   CBG: Recent Labs  Lab 02/28/23 1523  GLUCAP 127*    Discharge time spent: greater than 30 minutes.  Signed: Berle Mull, MD [facility] 03/03/2023

## 2023-03-06 ENCOUNTER — Ambulatory Visit: Payer: Federal, State, Local not specified - PPO | Admitting: Family

## 2023-03-06 ENCOUNTER — Encounter: Payer: Self-pay | Admitting: Family

## 2023-03-06 VITALS — BP 142/90 | HR 108 | Temp 98.1°F | Resp 16 | Ht 73.0 in | Wt 235.4 lb

## 2023-03-06 DIAGNOSIS — R519 Headache, unspecified: Secondary | ICD-10-CM | POA: Diagnosis not present

## 2023-03-06 DIAGNOSIS — I10 Essential (primary) hypertension: Secondary | ICD-10-CM

## 2023-03-06 DIAGNOSIS — E876 Hypokalemia: Secondary | ICD-10-CM | POA: Diagnosis not present

## 2023-03-06 DIAGNOSIS — R569 Unspecified convulsions: Secondary | ICD-10-CM

## 2023-03-06 MED ORDER — AMLODIPINE BESYLATE 10 MG PO TABS: 10.0000 mg | ORAL_TABLET | Freq: Every day | ORAL | 3 refills | Status: DC

## 2023-03-06 NOTE — Progress Notes (Signed)
Provider: Adaja Wander FNP-C  Edward Wolf, Edward M, MD  Patient Care Team: Edward Wolf, Edward M, MD as PCP - General Edward Wolf(Family Medicine)  Extended Emergency Contact Information Primary Emergency Contact: Edward Wolf,Edward  [place] Home Phone: [phone] Relation: Spouse Secondary Emergency Contact: Edward Wolf Mobile Phone: [phone] Relation: Son  Code Status:  Full Code  Goals of care: Advanced Directive information    03/01/2023    4:23 AM  Advanced Directives  Does Patient Have a Medical Advance Directive? No  Would patient like information on creating a medical advance directive? No - Patient declined     Chief Complaint  Patient presents with   Hospitalization Follow-up    Patient was in hospitalized for seizures. Second time in his life. He wants to discuss medications-hasn't taken anything this am. Doing great since out of Wolf.     HPI:  Pt is a [age] y.o. male seen today for an acute visit for transition of care post hospitalization from 02/28/2023 to 03/03/2023 at [facility].  He presented to the ED after he had a staring spell with 22 tonic-clonic activity with tongue bite which lasted for few minutes.  Symptoms started after 2 hours of complaining of left-sided headache and taking Tylenol without any improvement.  EMS was called was noted to have some left-sided weakness and postictal confusion.  Neurologist was consulted in the ED.  EEG done showed right hemispheric focus and MRI with and without contrast was negative for any acute abnormalities but did show evidence of punctate restricted diffusion in the right hippocampal area,which was thought due to seizure.also thought less likely stroke.He was on Keppra.Headache etiology unclear but suspected to be tension- like Headache.He was placed on seizure precaution.advised to [facility] in 6-8 weeks. His B/p was very high during admission but improved  with treatment was advised to follow up with PCP.  Also had severe hypokalemia which was replaced.will need recheck of BMP today. States SBP 180's though has not taken any medication this morning.He denies any dizziness,vision changes,fatigue,chest tightness,palpitation,chest pain or shortness of breath.     Past Medical History:  Diagnosis Date   Arthritis    Asthma    Colon polyps    Gout    Hay fever    Hyperlipidemia    Hypertension    Positive TB test    Seizures (HCC)    Urinary retention with incomplete bladder emptying    Past Surgical History:  Procedure Laterality Date   COLONOSCOPY     HERNIA REPAIR  1998   TOOTH EXTRACTION     WRIST SURGERY  1964    No Known Allergies  Outpatient Encounter Medications as of 03/06/2023  Medication Sig   acetaminophen (TYLENOL) 500 MG tablet Take 500 mg by mouth 2 (two) times daily as needed for mild pain.   albuterol (VENTOLIN HFA) 108 (90 Base) MCG/ACT inhaler Inhale 1 puff into the lungs every 6 (six) hours as needed for wheezing or shortness of breath.   allopurinol (ZYLOPRIM) 300 MG tablet TAKE 1 TABLET BY MOUTH EVERY DAY   finasteride (PROSCAR) 5 MG tablet Take 5 mg by mouth daily.   furosemide (LASIX) 20 MG tablet Take 2 tablets (40 mg total) by mouth daily as needed (for weight gain of > 3lbs in 1 day or >5 lbs in 1 week).   hydrALAZINE (APRESOLINE) 25 MG tablet Take 1 tablet (25 mg total) by mouth 3 (three) times daily.   HYDROcodone-acetaminophen (  NORCO/VICODIN) 5-325 MG tablet Take 1 tablet by mouth 2 (two) times daily as needed for moderate pain or severe pain.   levETIRAcetam (KEPPRA) 500 MG tablet Take 1 tablet (500 mg total) by mouth 2 (two) times daily.   minocycline (MINOCIN) 100 MG capsule TAKE 1 CAPSULE (100 MG TOTAL) BY MOUTH DAILY AS NEEDED (URINARY PROBLEMS).   Multiple Vitamin (MULTIVITAMIN) tablet Take 1 tablet by mouth every other day.   naproxen sodium (ALEVE) 220 MG tablet Take 1 tablet (220 mg total) by  mouth daily as needed for up to 7 days (headache).   potassium chloride SA (KLOR-CON Wolf) 20 MEQ tablet Take 1 tablet (20 mEq total) by mouth daily.   rosuvastatin (CRESTOR) 10 MG tablet Take 1 tablet (10 mg total) by mouth daily.   tamsulosin (FLOMAX) 0.4 MG CAPS capsule Take 0.4 mg by mouth daily after supper.   valsartan-hydrochlorothiazide (DIOVAN-HCT) 320-25 MG tablet Take 1 tablet by mouth daily.   amLODipine (NORVASC) 10 MG tablet Take 10 mg by mouth daily. (Patient not taking: Reported on 03/06/2023)   No facility-administered encounter medications on file as of 03/06/2023.    Review of Systems  Constitutional:  Negative for appetite change, chills, fatigue, fever and unexpected weight change.  HENT:  Negative for congestion, dental problem, ear discharge, ear pain, facial swelling, hearing loss, nosebleeds, postnasal drip, rhinorrhea, sinus pressure, sinus pain, sneezing, sore throat, tinnitus and trouble swallowing.   Eyes:  Negative for pain, discharge, redness, itching and visual disturbance.  Respiratory:  Negative for cough, chest tightness, shortness of breath and wheezing.   Cardiovascular:  Negative for chest pain, palpitations and leg swelling.  Gastrointestinal:  Negative for abdominal distention, abdominal pain, blood in stool, constipation, diarrhea, nausea and vomiting.  Endocrine: Negative for cold intolerance, heat intolerance, polydipsia, polyphagia and polyuria.  Genitourinary:  Negative for difficulty urinating, dysuria, flank pain, frequency and urgency.  Musculoskeletal:  Negative for arthralgias, back pain, gait problem, joint swelling, myalgias, neck pain and neck stiffness.  Skin:  Negative for color change, pallor, rash and wound.  Neurological:  Positive for headaches. Negative for dizziness, syncope, speech difficulty, weakness, light-headedness and numbness.  Hematological:  Does not bruise/bleed easily.  Psychiatric/Behavioral:  Negative for agitation,  behavioral problems, confusion, hallucinations, self-injury, sleep disturbance and suicidal ideas. The patient is not nervous/anxious.     Immunization History  Administered Date(s) Administered   Fluad Quad(high Dose 65+) 09/05/2021, 09/12/2022   Influenza, High Dose Seasonal PF 10/03/2018   Influenza,inj,Quad PF,6+ Mos 08/24/2014   Pneumococcal Polysaccharide-23 07/14/2012   Tdap 07/14/2012   Pertinent  Health Maintenance Due  Topic Date Due   COLONOSCOPY (Pts [phone] Insurance coverage will need to be confirmed)  Never done   INFLUENZA VACCINE  Completed      12/07/2022    8:23 PM 12/08/2022    7:46 AM 12/08/2022    8:15 PM 12/09/2022    8:00 AM 12/13/2022    9:44 AM  Fall Risk  Falls in the past year?     0  Was there an injury with Fall?     0  Fall Risk Category Calculator     1  Fall Risk Category (Retired)     Low  (RETIRED) Patient Fall Risk Level High fall risk High fall risk High fall risk High fall risk High fall risk  Patient at Risk for Falls Due to     No Fall Risks  Fall risk Follow up     Falls evaluation  completed   Functional Status Survey:    Vitals:   03/06/23 0856  BP: (!) 140/92  Pulse: (!) 108  Resp: 16  Temp: 98.1 F (36.7 C)  TempSrc: Temporal  SpO2: 98%  Weight: 235 lb 6.4 oz (106.8 kg)  Height: 6\' 1"  (1.854 Wolf)   Body mass index is 31.06 kg/Wolf. Physical Exam Vitals reviewed.  Constitutional:      General: He is not in acute distress.    Appearance: Normal appearance. He is obese. He is not ill-appearing or diaphoretic.  HENT:     Head: Normocephalic.     Right Ear: Tympanic membrane, ear canal and external ear normal. There is no impacted cerumen.     Left Ear: Tympanic membrane, ear canal and external ear normal. There is no impacted cerumen.     Nose: Nose normal. No congestion or rhinorrhea.     Mouth/Throat:     Mouth: Mucous membranes are moist.     Pharynx: Oropharynx is clear. No oropharyngeal exudate or posterior  oropharyngeal erythema.  Eyes:     General: No scleral icterus.       Right eye: No discharge.        Left eye: No discharge.     Extraocular Movements: Extraocular movements intact.     Conjunctiva/sclera: Conjunctivae normal.     Pupils: Pupils are equal, round, and reactive to light.  Neck:     Vascular: No carotid bruit.  Cardiovascular:     Rate and Rhythm: Normal rate and regular rhythm.     Pulses: Normal pulses.     Heart sounds: Normal heart sounds. No murmur heard.    No friction rub. No gallop.  Pulmonary:     Effort: Pulmonary effort is normal. No respiratory distress.     Breath sounds: Normal breath sounds. No wheezing, rhonchi or rales.  Chest:     Chest wall: No tenderness.  Abdominal:     General: Bowel sounds are normal. There is no distension.     Palpations: Abdomen is soft. There is no mass.     Tenderness: There is no abdominal tenderness. There is no right CVA tenderness, left CVA tenderness, guarding or rebound.  Musculoskeletal:        General: No swelling or tenderness. Normal range of motion.     Cervical back: Normal range of motion. No rigidity or tenderness.     Right lower leg: No edema.     Left lower leg: No edema.  Lymphadenopathy:     Cervical: No cervical adenopathy.  Skin:    General: Skin is warm and dry.     Coloration: Skin is not pale.     Findings: No bruising, erythema, lesion or rash.  Neurological:     Mental Status: He is alert and oriented to person, place, and time.     Cranial Nerves: No cranial nerve deficit.     Sensory: No sensory deficit.     Motor: No weakness.     Coordination: Coordination normal.     Gait: Gait normal.  Psychiatric:        Mood and Affect: Mood normal.        Speech: Speech normal.        Behavior: Behavior normal.        Thought Content: Thought content normal.        Judgment: Judgment normal.     Labs reviewed: Recent Labs    03/01/23 0443 03/01/23 1641 03/02/23 0902 03/03/23 0815  NA 139  --  140 139  K 2.7* 2.9* 3.4* 3.5  CL 103  --  105 105  CO2 25  --  27 25  GLUCOSE 102*  --  106* 107*  BUN 7*  --  11 11  CREATININE 0.96  --  0.98 0.94  CALCIUM 9.0  --  9.2 9.3  MG 2.1  --  1.9 1.9   Recent Labs    12/08/22 0249 02/28/23 1526 03/03/23 0815  AST 33 32 18  ALT 19 18 16   ALKPHOS 75 83 69  BILITOT 1.3* 1.0 1.2  PROT 7.6 8.1 7.3  ALBUMIN 4.2 4.4 4.0   Recent Labs    12/13/22 1013 02/28/23 1526 02/28/23 1610 03/03/23 0815  WBC 6.3 11.2*  --  7.6  NEUTROABS 3,257 6.4  --  4.1  HGB 13.9 15.6 15.6 14.6  HCT 40.6 45.7 46.0 42.5  MCV 90.4 91.2  --  89.7  PLT 294 297  --  242   No results found for: "TSH" No results found for: "HGBA1C" Lab Results  Component Value Date   CHOL 169 11/08/2022   HDL 42 11/08/2022   LDLCALC 99 11/08/2022   TRIG 182 (H) 11/08/2022   CHOLHDL 4.0 11/08/2022    Significant Diagnostic Results in last 30 days:  MR BRAIN W WO CONTRAST  Result Date: 03/01/2023 CLINICAL DATA:  Seizure disorder, clinical change. EXAM: MRI HEAD WITHOUT AND WITH CONTRAST TECHNIQUE: Multiplanar, multiecho pulse sequences of the brain and surrounding structures were obtained without and with intravenous contrast. CONTRAST:  51mL GADAVIST GADOBUTROL 1 MMOL/ML IV SOLN COMPARISON:  CT head 1 day prior, brain MRI 12/07/2022 FINDINGS: Brain: There is a punctate focus of diffusion restriction in the right hippocampus (2-24). There is no diffusion restriction. There is no acute intracranial hemorrhage or extra-axial fluid collection. Background parenchymal volume is within expected limits for age. The ventricles are stable in size. Patchy and confluent FLAIR signal abnormality in the supratentorial white matter likely reflects sequela of moderate chronic small-vessel ischemic change, stable since the prior brain MRI. 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 There is no new, suspicious, or acute parenchymal signal abnormality. The hippocampi are  normal in signal and architecture. The pituitary and suprasellar region are normal. There is no mass lesion or abnormal enhancement. There is no mass effect or midline shift. Vascular: Normal flow voids. Skull and upper cervical spine: Normal marrow signal. Sinuses/Orbits: The paranasal sinuses are clear. The globes and orbits are unremarkable. Other: None. IMPRESSION: 1. Punctate focus of diffusion restriction in the right hippocampus may reflect a small acute infarct or seizure-related change. 2. No other acute intracranial pathology or significant change since the prior brain MRI from 12/07/2022. Electronically Signed   By: Lesia Hausen Wolf.D.   On: 03/01/2023 16:20   Overnight EEG with video  Result Date: 03/01/2023 Charlsie Quest, MD     03/01/2023  5:01 PM Patient Name: Edward Wolf MRN: [id number] Epilepsy Attending: Charlsie Quest Referring Physician/Provider: Marjorie Smolder, NP Duration: 02/28/2023 1615 to 03/01/2023 1331 Patient history: 77 year old patient with history of hyperlipidemia, hypertension and BPH presents with seizure activity, headache and left-sided weakness. EEG to evaluate for seizure Level of alertness: Awake, asleep AEDs during EEG study: LEV Technical aspects: This EEG study was done with scalp electrodes positioned according to the 10-20 International system of electrode placement. Electrical activity was reviewed with band pass filter of 1-70Hz , sensitivity of 7 uV/mm, display  speed of 2mm/sec with a 60Hz  notched filter applied as appropriate. EEG data were recorded continuously and digitally stored.  Video monitoring was available and reviewed as appropriate. Description: The posterior dominant rhythm consists of 9 Hz activity of moderate voltage (25-35 uV) seen predominantly in posterior head regions, symmetric and reactive to eye opening and eye closing. Sleep was characterized by vertex waves, sleep spindles (12 to 14 Hz), maximal frontocentral region. EEG showed  continuous low amplitude 2- 5 hz theta-delta slowing in right hemisphere. Hyperventilation and photic stimulation were not performed.   ABNORMALITY - Continuous slow, right hemisphere  IMPRESSION: This study is is suggestive of cortical dysfunction arising from right hemisphere likely secondary to underlying structural abnormality, post-ictal state. No seizures or epileptiform discharges were seen during this study. Charlsie Quest   CT HEAD CODE STROKE WO CONTRAST  Result Date: 02/28/2023 CLINICAL DATA:  Code stroke.  Seizure EXAM: CT HEAD WITHOUT CONTRAST TECHNIQUE: Contiguous axial images were obtained from the base of the skull through the vertex without intravenous contrast. RADIATION DOSE REDUCTION: This exam was performed according to the departmental dose-optimization program which includes automated exposure control, adjustment of the mA and/or kV according to patient size and/or use of iterative reconstruction technique. COMPARISON:  12/07/2022 CT head FINDINGS: Brain: No evidence of acute infarction, hemorrhage, mass, mass effect, or midline shift. No hydrocephalus or extra-axial collection. 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 Vascular: No hyperdense vessel. Skull: Negative for fracture or focal lesion. Sinuses/Orbits: Mucosal thickening in the left frontal sinus. No acute finding in the orbits. Other: The mastoid air cells are well aerated. ASPECTS [place] Stroke Program Early CT Score) - Ganglionic level infarction (caudate, lentiform nuclei, internal capsule, insula, M1-M3 cortex): 7 - Supraganglionic infarction (M4-M6 cortex): 3 Total score (0-10 with 10 being normal): 10 IMPRESSION: 1. No acute intracranial process. 2. ASPECTS is 10. Imaging results were communicated on 02/28/2023 at 3:47 pm to provider Dr. Wilford Corner via secure text paging. Electronically Signed   By: Wiliam Ke Wolf.D.   On: 02/28/2023 15:48    Assessment/Plan  1.  Seizures [place] [facility]) S/p ED visit due to seizure activity -No seizure activity reported since discharge home -Continue on Keppra -Follow-up with [facility] as advised from the Wolf  2. Hypokalemia K+ 2.7 during hospitalization was replete. -Will recheck BMP  3. Primary hypertension Home blood pressures still elevated also high this visit though has not taken his medication -Will continue on valsartan-hydrochlorothiazide, amlodipine and hydralazine - Advised to check Blood pressure at home and record on log provided and notify provider if B/p > 140/90  -Will increase hydralazine to 50 mg if still elevated - CBC with Differential/Platelet - Basic metabolic panel  4. Nonintractable headache, unspecified chronicity pattern, unspecified headache type Etiology unclear though suspect possible related to blood pressure/seizures -Continue on Tylenol -Encouraged to increase water intake - CBC with Differential/Platelet  Family/ staff Communication: Reviewed plan of care with patient verbalized understanding  Labs/tests ordered:  - CBC with Differential/Platelet - Basic metabolic panel  Next Appointment: Return if symptoms worsen or fail to improve.   Caesar Bookman, NP

## 2023-03-07 LAB — BASIC METABOLIC PANEL
BUN: 13 mg/dL (ref 7–25)
CO2: 28 mmol/L (ref 20–32)
Calcium: 9.7 mg/dL (ref 8.6–10.3)
Chloride: 103 mmol/L (ref 98–110)
Creat: 0.93 mg/dL (ref 0.70–1.28)
Glucose, Bld: 82 mg/dL (ref 65–99)
Potassium: 3.7 mmol/L (ref 3.5–5.3)
Sodium: 139 mmol/L (ref 135–146)

## 2023-03-07 LAB — CBC WITH DIFFERENTIAL/PLATELET
Absolute Monocytes: 686 cells/uL (ref 200–950)
Basophils Absolute: 39 cells/uL (ref 0–200)
Basophils Relative: 0.5 %
Eosinophils Absolute: 367 cells/uL (ref 15–500)
Eosinophils Relative: 4.7 %
HCT: 42.9 % (ref 38.5–50.0)
Hemoglobin: 14.5 g/dL (ref 13.2–17.1)
Lymphs Abs: 952 cells/uL (ref 850–3900)
MCH: 30.3 pg (ref 27.0–33.0)
MCHC: 33.8 g/dL (ref 32.0–36.0)
MCV: 89.6 fL (ref 80.0–100.0)
MPV: 10 fL (ref 7.5–12.5)
Monocytes Relative: 8.8 %
Neutro Abs: 5756 cells/uL (ref 1500–7800)
Neutrophils Relative %: 73.8 %
Platelets: 260 10*3/uL (ref 140–400)
RBC: 4.79 10*6/uL (ref 4.20–5.80)
RDW: 13.2 % (ref 11.0–15.0)
Total Lymphocyte: 12.2 %
WBC: 7.8 10*3/uL (ref 3.8–10.8)

## 2023-03-14 ENCOUNTER — Other Ambulatory Visit: Payer: Self-pay | Admitting: Family Medicine

## 2023-03-14 DIAGNOSIS — R609 Edema, unspecified: Secondary | ICD-10-CM

## 2023-04-05 ENCOUNTER — Other Ambulatory Visit: Payer: Self-pay | Admitting: Family Medicine

## 2023-04-08 NOTE — Addendum Note (Signed)
Addended byRicharda Blade C on: 04/08/2023 04:35 PM   Modules accepted: Level of Service

## 2023-04-15 ENCOUNTER — Ambulatory Visit: Payer: Medicare Other | Admitting: Neurology

## 2023-04-15 ENCOUNTER — Encounter: Payer: Self-pay | Admitting: Neurology

## 2023-04-17 ENCOUNTER — Other Ambulatory Visit: Payer: Self-pay

## 2023-04-17 MED ORDER — HYDRALAZINE HCL 25 MG PO TABS: 25.0000 mg | ORAL_TABLET | Freq: Three times a day (TID) | ORAL | 11 refills | Status: DC

## 2023-05-15 ENCOUNTER — Encounter: Payer: Self-pay | Admitting: Family Medicine

## 2023-05-15 ENCOUNTER — Ambulatory Visit: Payer: Federal, State, Local not specified - PPO | Admitting: Family Medicine

## 2023-05-15 VITALS — BP 122/80 | HR 87 | Temp 97.8°F | Ht 73.0 in | Wt 246.2 lb

## 2023-05-15 DIAGNOSIS — E876 Hypokalemia: Secondary | ICD-10-CM

## 2023-05-15 DIAGNOSIS — Z1159 Encounter for screening for other viral diseases: Secondary | ICD-10-CM

## 2023-05-15 DIAGNOSIS — R569 Unspecified convulsions: Secondary | ICD-10-CM

## 2023-05-15 DIAGNOSIS — E785 Hyperlipidemia, unspecified: Secondary | ICD-10-CM | POA: Diagnosis not present

## 2023-05-15 DIAGNOSIS — M109 Gout, unspecified: Secondary | ICD-10-CM

## 2023-05-15 DIAGNOSIS — I10 Essential (primary) hypertension: Secondary | ICD-10-CM

## 2023-05-15 NOTE — Patient Instructions (Signed)
Reduce amlodipine from 10 mg to 5 mg and take it at night rather than in the morning Continue to monitor your pressure and let me know if it is consistently greater than 140/90 Elevate your feet whenever you are sitting and do some walking also to help with swelling Watch your carbohydrate as well as salt intake for both both weight control and swelling

## 2023-05-15 NOTE — Progress Notes (Signed)
Provider:  Jacalyn Lefevre, MD  Careteam: Patient Care Team: Frederica Kuster, MD as PCP - General (Family Medicine)  PLACE OF SERVICE:  [facility]  Advanced Directive information    No Known Allergies  Chief Complaint  Patient presents with   Medical Management of Chronic Issues    Patient presents today for a 6 month follow-up   Quality Metric Gaps    Colonoscopy, Hep C screening, Pneumonia, TDAP, COVID#1     HPI: Patient is a 77 y.o. male here for 77-month follow-up for medical management of chronic problems including hypertension new seizure disorder hyperlipidemia and BPH.  Today his main concern is the swelling in his ankles and feet.  He has no history of renal or cardiac issues that would contribute.  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  In reviewing his medicines note that he is on 10 mg of amlodipine which may be contributing to the swelling. Since his last visit he had developed seizures but they are now controlled with Keppra.  Review of Systems:  Review of Systems  HENT: Negative.    Respiratory: Negative.    Cardiovascular:  Positive for leg swelling.  Gastrointestinal: Negative.   Genitourinary:  Positive for frequency.  Musculoskeletal: Negative.   Skin: Negative.   Psychiatric/Behavioral: Negative.    All other systems reviewed and are negative.   Past Medical History:  Diagnosis Date   Arthritis    Asthma    Colon polyps    Gout    Hay fever    Hyperlipidemia    Hypertension    Positive TB test    Seizures (HCC)    Urinary retention with incomplete bladder emptying    Past Surgical History:  Procedure Laterality Date   COLONOSCOPY     HERNIA REPAIR  1998   TOOTH EXTRACTION     WRIST SURGERY  1964   Social History:   reports that he has been smoking cigars. He has never used smokeless tobacco. He reports that he does not currently use alcohol. He reports current drug use. Drug: Marijuana.  Family History  Problem  Relation Age of Onset   Arthritis Mother    Hyperlipidemia Mother    Heart disease Mother    Hypertension Mother    Uterine cancer Mother    Arthritis Father    Hyperlipidemia Father    Heart disease Father    Hypertension Father    Tuberculosis Father    Hyperlipidemia Sister    Hypertension Sister    Heart disease Sister    Arthritis Maternal Grandmother    Arthritis Maternal Grandfather    Arthritis Paternal Grandmother    Arthritis Paternal Grandfather    Prostate cancer Neg Hx     Medications: Patient's Medications  New Prescriptions   No medications on file  Previous Medications   ACETAMINOPHEN (TYLENOL) 500 MG TABLET    Take 500 mg by mouth 2 (two) times daily as needed for mild pain.   ALBUTEROL (VENTOLIN HFA) 108 (90 BASE) MCG/ACT INHALER    Inhale 1 puff into the lungs every 6 (six) hours as needed for wheezing or shortness of breath.   ALLOPURINOL (ZYLOPRIM) 300 MG TABLET    TAKE 1 TABLET BY MOUTH EVERY DAY   AMLODIPINE (NORVASC) 10 MG TABLET    Take 1 tablet (10 mg total) by mouth daily.   FINASTERIDE (PROSCAR) 5 MG TABLET    Take 5 mg by mouth daily.  FUROSEMIDE (LASIX) 20 MG TABLET    TAKE 2 TABLETS (40 MG TOTAL) BY MOUTH DAILY.   HYDRALAZINE (APRESOLINE) 25 MG TABLET    Take 1 tablet (25 mg total) by mouth 3 (three) times daily.   HYDROCODONE-ACETAMINOPHEN (NORCO/VICODIN) 5-325 MG TABLET    Take 1 tablet by mouth 2 (two) times daily as needed for moderate pain or severe pain.   LEVETIRACETAM (KEPPRA) 500 MG TABLET    Take 1 tablet (500 mg total) by mouth 2 (two) times daily.   MINOCYCLINE (MINOCIN) 100 MG CAPSULE    TAKE 1 CAPSULE (100 MG TOTAL) BY MOUTH DAILY AS NEEDED (URINARY PROBLEMS).   MULTIPLE VITAMIN (MULTIVITAMIN) TABLET    Take 1 tablet by mouth every other day.   POTASSIUM CHLORIDE SA (KLOR-CON M) 20 MEQ TABLET    Take 1 tablet (20 mEq total) by mouth daily.   ROSUVASTATIN (CRESTOR) 10 MG TABLET    TAKE 1 TABLET BY MOUTH EVERY DAY   TAMSULOSIN  (FLOMAX) 0.4 MG CAPS CAPSULE    Take 0.4 mg by mouth daily after supper.   VALSARTAN-HYDROCHLOROTHIAZIDE (DIOVAN-HCT) 320-25 MG TABLET    Take 1 tablet by mouth daily.  Modified Medications   No medications on file  Discontinued Medications   No medications on file    Physical Exam:  Vitals:   05/15/23 0842  BP: 122/80  Pulse: 87  Temp: 97.8 F (36.6 C)  SpO2: 98%  Weight: 246 lb 3.2 oz (111.7 kg)  Height: 6\' 1"  (1.854 m)   Body mass index is 32.48 kg/m. Wt Readings from Last 3 Encounters:  05/15/23 246 lb 3.2 oz (111.7 kg)  03/06/23 235 lb 6.4 oz (106.8 kg)  02/28/23 235 lb 14.3 oz (107 kg)    Physical Exam Vitals and nursing note reviewed.  Constitutional:      Appearance: Normal appearance.  Neck:     Vascular: No carotid bruit.  Cardiovascular:     Rate and Rhythm: Normal rate and regular rhythm.  Pulmonary:     Effort: Pulmonary effort is normal.     Breath sounds: Normal breath sounds.  Abdominal:     General: Bowel sounds are normal.     Palpations: Abdomen is soft.  Musculoskeletal:     Right lower leg: Edema present.     Left lower leg: Edema present.     Comments: Swelling primarily in feet, 2+  Neurological:     General: No focal deficit present.     Mental Status: He is alert and oriented to person, place, and time.  Psychiatric:        Mood and Affect: Mood normal.        Behavior: Behavior normal.     Labs reviewed: Basic Metabolic Panel: Recent Labs    03/01/23 0443 03/01/23 1641 03/02/23 0902 03/03/23 0815 03/06/23 0936  NA 139  --  140 139 139  K 2.7*   < > 3.4* 3.5 3.7  CL 103  --  105 105 103  CO2 25  --  27 25 28   GLUCOSE 102*  --  106* 107* 82  BUN 7*  --  11 11 13   CREATININE 0.96  --  0.98 0.94 0.93  CALCIUM 9.0  --  9.2 9.3 9.7  MG 2.1  --  1.9 1.9  --    < > = values in this interval not displayed.   Liver Function Tests: Recent Labs    12/08/22 0249 02/28/23 1526 03/03/23 0815  AST  33 32 18  ALT 19 18 16    ALKPHOS 75 83 69  BILITOT 1.3* 1.0 1.2  PROT 7.6 8.1 7.3  ALBUMIN 4.2 4.4 4.0   No results for input(s): "LIPASE", "AMYLASE" in the last 8760 hours. No results for input(s): "AMMONIA" in the last 8760 hours. CBC: Recent Labs    02/28/23 1526 02/28/23 1610 03/03/23 0815 03/06/23 0936  WBC 11.2*  --  7.6 7.8  NEUTROABS 6.4  --  4.1 5,756  HGB 15.6 15.6 14.6 14.5  HCT 45.7 46.0 42.5 42.9  MCV 91.2  --  89.7 89.6  PLT 297  --  242 260   Lipid Panel: Recent Labs    11/08/22 0945  CHOL 169  HDL 42  LDLCALC 99  TRIG 182*  CHOLHDL 4.0   TSH: No results for input(s): "TSH" in the last 8760 hours. A1C: No results found for: "HGBA1C"   Assessment/Plan  1. Encounter for hepatitis C screening test for low risk patient Patient asymptomatic and no known exposure  2. Hypokalemia Takes furosemide 40 mg a day.  He does take potassium supplement however  3. Gout of foot, unspecified cause, unspecified chronicity, unspecified laterality No recent flares continues with allopurinol 300 mg daily  4. Hyperlipidemia, unspecified hyperlipidemia type LDL at 99.  On low-dose Crestor we will update lipids at next visit  5. Primary hypertension Blood pressures are well-controlled.  Will plan to make some minor changes reducing amlodipine from 10 mg to 5 mg in hopes of helping his dependent edema.  Will monitor pressures.  May need to change from amlodipine to other med depending on response  6. Seizures (HCC) Symptoms well-controlled on Keppra.  Continue same   Jacalyn Lefevre, MD [facility] [phone]

## 2023-05-16 ENCOUNTER — Other Ambulatory Visit: Payer: Self-pay | Admitting: Family

## 2023-05-16 LAB — BASIC METABOLIC PANEL WITH GFR
BUN: 13 mg/dL (ref 7–25)
CO2: 29 mmol/L (ref 20–32)
Calcium: 9.8 mg/dL (ref 8.6–10.3)
Chloride: 103 mmol/L (ref 98–110)
Creat: 0.96 mg/dL (ref 0.70–1.28)
Glucose, Bld: 98 mg/dL (ref 65–99)
Potassium: 3.4 mmol/L — ABNORMAL LOW (ref 3.5–5.3)
Sodium: 142 mmol/L (ref 135–146)
eGFR: 82 mL/min/{1.73_m2} (ref >=60)

## 2023-05-16 LAB — HEPATITIS C ANTIBODY: Hepatitis C Ab: NONREACTIVE

## 2023-05-17 ENCOUNTER — Other Ambulatory Visit: Payer: Self-pay

## 2023-05-17 MED ORDER — AMLODIPINE BESYLATE 5 MG PO TABS: 5.0000 mg | ORAL_TABLET | Freq: Every day | ORAL | 1 refills | Status: DC

## 2023-05-17 NOTE — Telephone Encounter (Signed)
Patient states that Amlodipine was to be changed from 10mg  to 5mg ,but 10 mg was sent to the pharmacy. Patient is indeed suppose to be switched to 5mg . Prescription for 5mg  sent to pharmacy.

## 2023-06-17 ENCOUNTER — Encounter: Payer: Self-pay | Admitting: Neurology

## 2023-06-17 ENCOUNTER — Ambulatory Visit: Payer: Federal, State, Local not specified - PPO | Admitting: Neurology

## 2023-06-17 VITALS — BP 133/77 | HR 80 | Ht 74.0 in | Wt 250.0 lb

## 2023-06-17 DIAGNOSIS — Z5181 Encounter for therapeutic drug level monitoring: Secondary | ICD-10-CM | POA: Diagnosis not present

## 2023-06-17 DIAGNOSIS — G40909 Epilepsy, unspecified, not intractable, without status epilepticus: Secondary | ICD-10-CM | POA: Diagnosis not present

## 2023-06-17 MED ORDER — LEVETIRACETAM 500 MG PO TABS: 500.0000 mg | ORAL_TABLET | Freq: Two times a day (BID) | ORAL | 4 refills | Status: DC

## 2023-06-17 NOTE — Patient Instructions (Signed)
Continue with Keppra 500 mg twice daily  Continue your other medications  Driving restriction for a total of 6 months  Return in a year of sooner if worse

## 2023-06-17 NOTE — Progress Notes (Signed)
[facility]  PATIENT: Edward Wolf DOB: 07-26-1946  REQUESTING CLINICIAN: Frederica Kuster, MD HISTORY FROM: Patient and chart review  REASON FOR VISIT: Seizure disorder    HISTORICAL  CHIEF COMPLAINT:  Chief Complaint  Patient presents with   New Patient (Initial Visit)    Rm 12. Patient Alone, No reports of new symptoms or changes since hospital. No mood changes reported since starting Keppra    HISTORY OF PRESENT ILLNESS:  This is a 77 year old gentleman past medical history of hypertension, hyperlipidemia, gout, who is presenting after his first generalized convulsion in March.  Patient reports that he was home, smoking marijuana and drinking alcohol and the next thing that he knows he woke up in the hospital.  He reports that wife found him on the floor having a full generalized tonic-clonic seizure.  In the hospital his MRI did show a focus of diffusion restriction in the right hippocampus and his EEG showed right focal slowing.  This was consistent with seizure activity.  He was started on Keppra 500 mg twice daily and since then has been doing well.  Denies any seizure or seizure-like activity.  He reported a year ago he had a fall and hit his head but he is not sure if it was a seizure or not, he did not go to the hospital.  He denies any side effect from the Keppra, tolerating  it well.  He denies any seizure risk factors.  Denies any other complaints or concerns.  Home, sitting in his chair    Handedness: Right handed   Onset: 02/28/2023  Seizure Type: convulsion   Current frequency: Once   Any injuries from seizures: Denies   Seizure risk factors: Denies   Previous ASMs: None   Currenty ASMs: Levetiracetam 500 mg twice daily   ASMs side effects: Denies   Brain Images: Diffusion restriction in the right hippocampus  Previous EEGs: right focal slowing    OTHER MEDICAL CONDITIONS: Hypertension, Hyperlipidemia, Gout   REVIEW OF SYSTEMS:  Full 14 system review of systems performed and negative with exception of: as noted in the HPI   ALLERGIES: No Known Allergies  HOME MEDICATIONS: Outpatient Medications Prior to Visit  Medication Sig Dispense Refill   acetaminophen (TYLENOL) 500 MG tablet Take 500 mg by mouth 2 (two) times daily as needed for mild pain.     albuterol (VENTOLIN HFA) 108 (90 Base) MCG/ACT inhaler Inhale 1 puff into the lungs every 6 (six) hours as needed for wheezing or shortness of breath. 18 g 5   allopurinol (ZYLOPRIM) 300 MG tablet TAKE 1 TABLET BY MOUTH EVERY DAY 90 tablet 1   amLODipine (NORVASC) 5 MG tablet Take 1 tablet (5 mg total) by mouth daily. 90 tablet 1   finasteride (PROSCAR) 5 MG tablet Take 5 mg by mouth daily.     furosemide (LASIX) 20 MG tablet TAKE 2 TABLETS (40 MG TOTAL) BY MOUTH DAILY. 180 tablet 1   hydrALAZINE (APRESOLINE) 25 MG tablet Take 1 tablet (25 mg total) by mouth 3 (three) times daily. 90 tablet 11   HYDROcodone-acetaminophen (NORCO/VICODIN) 5-325 MG tablet Take 1 tablet by mouth 2 (two) times daily as needed for moderate pain or severe pain. 10 tablet 0   minocycline (MINOCIN) 100 MG capsule TAKE 1 CAPSULE (100 MG TOTAL) BY MOUTH DAILY AS NEEDED (URINARY PROBLEMS). 90 capsule 1   Multiple Vitamin (MULTIVITAMIN) tablet Take 1 tablet by mouth every other day.     potassium chloride SA (  KLOR-CON M) 20 MEQ tablet Take 1 tablet (20 mEq total) by mouth daily. 60 tablet 1   rosuvastatin (CRESTOR) 10 MG tablet TAKE 1 TABLET BY MOUTH EVERY DAY 90 tablet 1   tamsulosin (FLOMAX) 0.4 MG CAPS capsule Take 0.4 mg by mouth daily after supper.     valsartan-hydrochlorothiazide (DIOVAN-HCT) 320-25 MG tablet Take 1 tablet by mouth daily. 30 tablet 0   levETIRAcetam (KEPPRA) 500 MG tablet Take 1 tablet (500 mg total) by mouth 2 (two) times daily. 60 tablet 2   No facility-administered medications prior to visit.    PAST MEDICAL HISTORY: Past Medical History:  Diagnosis Date   Arthritis     Asthma    Colon polyps    Gout    Hay fever    Hyperlipidemia    Hypertension    Positive TB test    Seizures (HCC)    Urinary retention with incomplete bladder emptying     PAST SURGICAL HISTORY: Past Surgical History:  Procedure Laterality Date   COLONOSCOPY     HERNIA REPAIR  1998   TOOTH EXTRACTION     WRIST SURGERY  1964    FAMILY HISTORY: Family History  Problem Relation Age of Onset   Arthritis Mother    Hyperlipidemia Mother    Heart disease Mother    Hypertension Mother    Uterine cancer Mother    Arthritis Father    Hyperlipidemia Father    Heart disease Father    Hypertension Father    Tuberculosis Father    Hyperlipidemia Sister    Hypertension Sister    Heart disease Sister    Arthritis Maternal Grandmother    Arthritis Maternal Grandfather    Arthritis Paternal Grandmother    Arthritis Paternal Grandfather    Prostate cancer Neg Hx     SOCIAL HISTORY: Social History   Socioeconomic History   Marital status: Married    Spouse name: Not on file   Number of children: Not on file   Years of education: Not on file   Highest education level: Not on file  Occupational History   Not on file  Tobacco Use   Smoking status: Every Day    Types: Cigars   Smokeless tobacco: Never  Vaping Use   Vaping Use: Never used  Substance and Sexual Activity   Alcohol use: Not Currently    Comment: Stop drinking on May 10, 2021   Drug use: Yes    Types: Marijuana    Comment: CBD   Sexual activity: Yes  Other Topics Concern   Not on file  Social History Narrative   Tobacco use, amount per day now: Mariajuana    Past tobacco use, amount per day: 2 Blunts    How many years did you use tobacco: 50 years    Alcohol use (drinks per week): No Alcohol in 6 weeks ( since bladder problem)   Diet: Excellent Protein, Fruit, Veggies, Light Red Meat, NO PORK   Do you drink/eat things with caffeine: Yes.   Marital status: Happy!                                 What  year were you married? 1989   Do you live in a house, apartment, assisted living, condo, trailer, etc.? Yes, House.   Is it one or more stories? 1   How many persons live in your home? 3   Do  you have pets in your home?( please list) No   Highest Level of education completed? [employer]   Current or past profession: Transport planner, Engineer, structural.   Do you exercise?     Yes                             Type and how often? Aerobic, Cardio, and Yoga.   Do you have a living will? No   Do you have a DNR form?  No                                 If not, do you want to discuss one?   Do you have signed POA/HPOA forms?   No                     If so, please bring to you appointment      Do you have any difficulty bathing or dressing yourself? No   Do you have any difficulty preparing food or eating? No   Do you have any difficulty managing your medications? No   Do you have any difficulty managing your finances? No   Do you have any difficulty affording your medications? No   Social Determinants of Health   Financial Resource Strain: Not on file  Food Insecurity: No Food Insecurity (03/01/2023)   Hunger Vital Sign    Worried About Running Out of Food in the Last Year: Never true    Ran Out of Food in the Last Year: Never true  Transportation Needs: No Transportation Needs (03/01/2023)   PRAPARE - Administrator, Civil Service (Medical): No    Lack of Transportation (Non-Medical): No  Physical Activity: Not on file  Stress: Not on file  Social Connections: Not on file  Intimate Partner Violence: Not At Risk (03/01/2023)   Humiliation, Afraid, Rape, and Kick questionnaire    Fear of Current or Ex-Partner: No    Emotionally Abused: No    Physically Abused: No    Sexually Abused: No    PHYSICAL EXAM  GENERAL EXAM/CONSTITUTIONAL: Vitals:  Vitals:   06/17/23 0758  BP: 133/77  Pulse: 80  Weight: 250 lb (113.4 kg)  Height: 6\' 2"  (1.88 m)   Body mass index is  32.1 kg/m. Wt Readings from Last 3 Encounters:  06/17/23 250 lb (113.4 kg)  05/15/23 246 lb 3.2 oz (111.7 kg)  03/06/23 235 lb 6.4 oz (106.8 kg)   Patient is in no distress; well developed, nourished and groomed; neck is supple  MUSCULOSKELETAL: Gait, strength, tone, movements noted in Neurologic exam below  NEUROLOGIC: MENTAL STATUS:      No data to display         awake, alert, oriented to person, place and time recent and remote memory intact normal attention and concentration language fluent, comprehension intact, naming intact fund of knowledge appropriate  CRANIAL NERVE:  2nd, 3rd, 4th, 6th - Visual fields full to confrontation, extraocular muscles intact, no nystagmus 5th - facial sensation symmetric 7th - facial strength symmetric 8th - hearing intact 9th - palate elevates symmetrically, uvula midline 11th - shoulder shrug symmetric 12th - tongue protrusion midline  MOTOR:  normal bulk and tone, full strength in the BUE, BLE. He has bilateral feet swelling  SENSORY:  normal and symmetric to light touch  COORDINATION:  finger-nose-finger, fine finger  movements normal  GAIT/STATION:  normal     DIAGNOSTIC DATA (LABS, IMAGING, TESTING) - I reviewed patient records, labs, notes, testing and imaging myself where available.  Lab Results  Component Value Date   WBC 7.8 03/06/2023   HGB 14.5 03/06/2023   HCT 42.9 03/06/2023   MCV 89.6 03/06/2023   PLT 260 03/06/2023      Component Value Date/Time   NA 142 05/15/2023 0929   K 3.4 (L) 05/15/2023 0929   CL 103 05/15/2023 0929   CO2 29 05/15/2023 0929   GLUCOSE 98 05/15/2023 0929   BUN 13 05/15/2023 0929   CREATININE 0.96 05/15/2023 0929   CALCIUM 9.8 05/15/2023 0929   PROT 7.3 03/03/2023 0815   ALBUMIN 4.0 03/03/2023 0815   AST 18 03/03/2023 0815   ALT 16 03/03/2023 0815   ALKPHOS 69 03/03/2023 0815   BILITOT 1.2 03/03/2023 0815   GFRNONAA >60 03/03/2023 0815   Lab Results  Component Value  Date   CHOL 169 11/08/2022   HDL 42 11/08/2022   LDLCALC 99 11/08/2022   TRIG 182 (H) 11/08/2022   No results found for: "HGBA1C" No results found for: "VITAMINB12" No results found for: "TSH"  EEG 03/01/2023 - Continuous slow, right hemisphere   MRI Brain w wo contrast 03/01/2023 1. Punctate focus of diffusion restriction in the right hippocampus may reflect a small acute infarct or seizure-related change. 2. No other acute intracranial pathology or significant change since the prior brain MRI from 12/07/2022.    I personally reviewed brain Images and previous EEG reports.   ASSESSMENT AND PLAN  77 y.o. year old male  with hypertension, hyperlipidemia, gout, who is presenting to establish care for his seizure.  He is currently on Keppra 500 mg twice daily, tolerating the medication very well, denies any seizure or seizure-like activity.  Plan for now is to obtain a Keppra level and continue patient on Keppra.  Since he is doing very well, he can follow-up with NP in a year or sooner if worse.  No other questions or concerns.  We also discussed driving restriction for total of 6 months. He voiced understanding.    1. Seizure disorder (HCC)   2. Therapeutic drug monitoring     Patient Instructions  Continue with Keppra 500 mg twice daily  Continue your other medications  Driving restriction for a total of 6 months  Return in a year of sooner if worse    Per [facility] statutes, patients with seizures are not allowed to drive until they have been seizure-free for six months.  Other recommendations include using caution when using heavy equipment or power tools. Avoid working on ladders or at heights. Take showers instead of baths.  Do not swim alone.  Ensure the water temperature is not too high on the home water heater. Do not go swimming alone. Do not lock yourself in a room alone (i.e. bathroom). When caring for infants or small children, sit down when holding, feeding, or  changing them to minimize risk of injury to the child in the event you have a seizure. Maintain good sleep hygiene. Avoid alcohol.  Also recommend adequate sleep, hydration, good diet and minimize stress.   During the Seizure  - First, ensure adequate ventilation and place patients on the floor on their left side  Loosen clothing around the neck and ensure the airway is patent. If the patient is clenching the teeth, do not force the mouth open with any object as  this can cause severe damage - Remove all items from the surrounding that can be hazardous. The patient may be oblivious to what's happening and may not even know what he or she is doing. If the patient is confused and wandering, either gently guide him/her away and block access to outside areas - Reassure the individual and be comforting - Call 911. In most cases, the seizure ends before EMS arrives. However, there are cases when seizures may last over 3 to 5 minutes. Or the individual may have developed breathing difficulties or severe injuries. If a pregnant patient or a person with diabetes develops a seizure, it is prudent to call an ambulance. - Finally, if the patient does not regain full consciousness, then call EMS. Most patients will remain confused for about 45 to 90 minutes after a seizure, so you must use judgment in calling for help. - Avoid restraints but make sure the patient is in a bed with padded side rails - Place the individual in a lateral position with the neck slightly flexed; this will help the saliva drain from the mouth and prevent the tongue from falling backward - Remove all nearby furniture and other hazards from the area - Provide verbal assurance as the individual is regaining consciousness - Provide the patient with privacy if possible - Call for help and start treatment as ordered by the caregiver   After the Seizure (Postictal Stage)  After a seizure, most patients experience confusion, fatigue, muscle  pain and/or a headache. Thus, one should permit the individual to sleep. For the next few days, reassurance is essential. Being calm and helping reorient the person is also of importance.  Most seizures are painless and end spontaneously. Seizures are not harmful to others but can lead to complications such as stress on the lungs, brain and the heart. Individuals with prior lung problems may develop labored breathing and respiratory distress.     Orders Placed This Encounter  Procedures   Levetiracetam level    Meds ordered this encounter  Medications   levETIRAcetam (KEPPRA) 500 MG tablet    Sig: Take 1 tablet (500 mg total) by mouth 2 (two) times daily.    Dispense:  180 tablet    Refill:  4    Return in about 1 year (around 06/16/2024).    Windell Norfolk, MD 06/17/2023, 8:26 AM  [facility] [address], [address] [place], [place] [address] [phone]

## 2023-06-18 LAB — LEVETIRACETAM LEVEL: Levetiracetam Lvl: 19.4 ug/mL (ref 10.0–40.0)

## 2023-07-02 ENCOUNTER — Telehealth: Payer: Self-pay

## 2023-07-02 ENCOUNTER — Telehealth: Payer: Federal, State, Local not specified - PPO

## 2023-07-02 ENCOUNTER — Other Ambulatory Visit: Payer: Self-pay

## 2023-07-02 MED ORDER — VALSARTAN-HYDROCHLOROTHIAZIDE 320-25 MG PO TABS: 1.0000 | ORAL_TABLET | Freq: Every day | ORAL | 1 refills | Status: DC

## 2023-07-02 NOTE — Telephone Encounter (Signed)
Patient called to express that he has ongoing leg swelling mentioned at last office visit. Patient states swelling is about the same with no improvement and he questioning if swelling is related to one of his current medications or if another plan of treatment is needed. Patient emphasized that he is sitting with his feet elevated when time allowed in an attempt to reduce swelling, also with no improvement. Please advise

## 2023-07-02 NOTE — Telephone Encounter (Signed)
Ask him to discontinue amlodipine and monitor BP

## 2023-07-02 NOTE — Telephone Encounter (Signed)
Left message on voicemail for patient to return call when available   

## 2023-07-03 NOTE — Telephone Encounter (Addendum)
Patient returned call.  I discussed Dr.Miller's response and patient verbalized understanding.  Medication list updated to reflect discontinuing the amlodipine.

## 2023-07-03 NOTE — Addendum Note (Signed)
Addended by: Maurice Small on: 07/03/2023 09:25 AM   Modules accepted: Orders

## 2023-07-12 ENCOUNTER — Other Ambulatory Visit: Payer: Self-pay | Admitting: Family Medicine

## 2023-08-18 ENCOUNTER — Other Ambulatory Visit: Payer: Self-pay | Admitting: Family Medicine

## 2023-08-28 ENCOUNTER — Ambulatory Visit: Payer: Federal, State, Local not specified - PPO | Admitting: Sports Medicine

## 2023-08-28 ENCOUNTER — Encounter: Payer: Self-pay | Admitting: Sports Medicine

## 2023-08-28 VITALS — BP 142/110 | HR 91 | Temp 97.4°F | Resp 17 | Ht 74.0 in | Wt 257.2 lb

## 2023-08-28 DIAGNOSIS — Z23 Encounter for immunization: Secondary | ICD-10-CM | POA: Diagnosis not present

## 2023-08-28 DIAGNOSIS — J4540 Moderate persistent asthma, uncomplicated: Secondary | ICD-10-CM

## 2023-08-28 DIAGNOSIS — I10 Essential (primary) hypertension: Secondary | ICD-10-CM

## 2023-08-28 DIAGNOSIS — M7989 Other specified soft tissue disorders: Secondary | ICD-10-CM

## 2023-08-28 MED ORDER — BUDESONIDE-FORMOTEROL FUMARATE 160-4.5 MCG/ACT IN AERO: 2.0000 | INHALATION_SPRAY | Freq: Two times a day (BID) | RESPIRATORY_TRACT | 3 refills | Status: AC

## 2023-08-28 NOTE — Progress Notes (Signed)
Careteam: Patient Care Team: Venita Sheffield, MD as PCP - General (Internal Medicine)  PLACE OF SERVICE:  [facility]  Advanced Directive information    No Known Allergies  Chief Complaint  Patient presents with   Acute Visit    Patient complains of swollen feet and ankles.      HPI: Patient is a 77 y.o. male   Presented to clinic for acute visit for ankle swelling   Ankle swelling  Chronic , states its not worse but not better either Orthopnea, sleeps on 2 pillow, no recent change Denies PND Walks 3 times / week ,denies chest pain, SOS, palpitations, dizziness  H/o Asthma Uses albuterol every other night  No h/o smoking cigarettes but smokes cannabis   HTN  High today  He does notknow his medications Did not bring his medication bag with him.    Review of Systems:  Review of Systems  Constitutional:  Negative for chills and fever.  HENT:  Negative for congestion and sore throat.   Eyes:  Negative for double vision.  Respiratory:  Negative for cough, sputum production and shortness of breath.   Cardiovascular:  Positive for leg swelling. Negative for chest pain and palpitations.  Gastrointestinal:  Negative for abdominal pain, heartburn and nausea.  Genitourinary:  Negative for dysuria, frequency and hematuria.  Musculoskeletal:  Negative for falls and myalgias.  Neurological:  Negative for dizziness, sensory change and focal weakness.     Past Medical History:  Diagnosis Date   Arthritis    Asthma    Colon polyps    Gout    Hay fever    Hyperlipidemia    Hypertension    Positive TB test    Seizures (HCC)    Urinary retention with incomplete bladder emptying    Past Surgical History:  Procedure Laterality Date   COLONOSCOPY     HERNIA REPAIR  1998   TOOTH EXTRACTION     WRIST SURGERY  1964   Social History:   reports that he has been smoking cigars. He has never used smokeless tobacco. He reports that he does not currently use  alcohol. He reports current drug use. Drug: Marijuana.  Family History  Problem Relation Age of Onset   Arthritis Mother    Hyperlipidemia Mother    Heart disease Mother    Hypertension Mother    Uterine cancer Mother    Arthritis Father    Hyperlipidemia Father    Heart disease Father    Hypertension Father    Tuberculosis Father    Hyperlipidemia Sister    Hypertension Sister    Heart disease Sister    Arthritis Maternal Grandmother    Arthritis Maternal Grandfather    Arthritis Paternal Grandmother    Arthritis Paternal Grandfather    Prostate cancer Neg Hx     Medications: Patient's Medications  New Prescriptions   No medications on file  Previous Medications   ACETAMINOPHEN (TYLENOL) 500 MG TABLET    Take 500 mg by mouth 2 (two) times daily as needed for mild pain.   ALBUTEROL (VENTOLIN HFA) 108 (90 BASE) MCG/ACT INHALER    Inhale 1 puff into the lungs every 6 (six) hours as needed for wheezing or shortness of breath.   ALLOPURINOL (ZYLOPRIM) 300 MG TABLET    TAKE 1 TABLET BY MOUTH EVERY DAY   FINASTERIDE (PROSCAR) 5 MG TABLET    Take 5 mg by mouth daily.   FUROSEMIDE (LASIX) 20 MG TABLET  TAKE 2 TABLETS (40 MG TOTAL) BY MOUTH DAILY.   HYDRALAZINE (APRESOLINE) 25 MG TABLET    Take 1 tablet (25 mg total) by mouth 3 (three) times daily.   LEVETIRACETAM (KEPPRA) 500 MG TABLET    Take 1 tablet (500 mg total) by mouth 2 (two) times daily.   MINOCYCLINE (MINOCIN) 100 MG CAPSULE    TAKE 1 CAPSULE (100 MG TOTAL) BY MOUTH DAILY AS NEEDED (URINARY PROBLEMS).   MULTIPLE VITAMIN (MULTIVITAMIN) TABLET    Take 1 tablet by mouth every other day.   POTASSIUM CHLORIDE SA (KLOR-CON M) 20 MEQ TABLET    Take 1 tablet (20 mEq total) by mouth daily.   ROSUVASTATIN (CRESTOR) 10 MG TABLET    TAKE 1 TABLET BY MOUTH EVERY DAY   TAMSULOSIN (FLOMAX) 0.4 MG CAPS CAPSULE    Take 0.4 mg by mouth daily after supper.   VALSARTAN-HYDROCHLOROTHIAZIDE (DIOVAN-HCT) 320-25 MG TABLET    Take 1 tablet by  mouth daily.  Modified Medications   No medications on file  Discontinued Medications   HYDROCODONE-ACETAMINOPHEN (NORCO/VICODIN) 5-325 MG TABLET    Take 1 tablet by mouth 2 (two) times daily as needed for moderate pain or severe pain.    Physical Exam:  Vitals:   08/28/23 0940 08/28/23 0945  BP: (!) 160/102 (!) 140/100  Pulse: 91   Resp: 17   Temp: (!) 97.4 F (36.3 C)   SpO2: 98%   Weight: 257 lb 3.2 oz (116.7 kg)   Height: 6\' 2"  (1.88 m)    Body mass index is 33.02 kg/m. Wt Readings from Last 3 Encounters:  08/28/23 257 lb 3.2 oz (116.7 kg)  06/17/23 250 lb (113.4 kg)  05/15/23 246 lb 3.2 oz (111.7 kg)    Physical Exam Constitutional:      Appearance: Normal appearance.  HENT:     Head: Normocephalic and atraumatic.  Cardiovascular:     Rate and Rhythm: Normal rate and regular rhythm.     Pulses: Normal pulses.     Heart sounds: Normal heart sounds.  Pulmonary:     Effort: No respiratory distress.     Breath sounds: No stridor. No wheezing or rales.  Abdominal:     General: Bowel sounds are normal. There is no distension.     Palpations: Abdomen is soft.     Tenderness: There is no abdominal tenderness. There is no right CVA tenderness or guarding.  Musculoskeletal:        General: Swelling (bil ankle swelling, pitting oedema 1+) present.  Neurological:     Mental Status: He is alert. Mental status is at baseline.     Sensory: No sensory deficit.     Motor: No weakness.     Labs reviewed: Basic Metabolic Panel: Recent Labs    03/01/23 0443 03/01/23 1641 03/02/23 0902 03/03/23 0815 03/06/23 0936 05/15/23 0929  NA 139  --  140 139 139 142  K 2.7*   < > 3.4* 3.5 3.7 3.4*  CL 103  --  105 105 103 103  CO2 25  --  27 25 28 29   GLUCOSE 102*  --  106* 107* 82 98  BUN 7*  --  11 11 13 13   CREATININE 0.96  --  0.98 0.94 0.93 0.96  CALCIUM 9.0  --  9.2 9.3 9.7 9.8  MG 2.1  --  1.9 1.9  --   --    < > = values in this interval not displayed.   Liver  Function Tests: Recent Labs    12/08/22 0249 02/28/23 1526 03/03/23 0815  AST 33 32 18  ALT 19 18 16   ALKPHOS 75 83 69  BILITOT 1.3* 1.0 1.2  PROT 7.6 8.1 7.3  ALBUMIN 4.2 4.4 4.0   No results for input(s): "LIPASE", "AMYLASE" in the last 8760 hours. No results for input(s): "AMMONIA" in the last 8760 hours. CBC: Recent Labs    02/28/23 1526 02/28/23 1610 03/03/23 0815 03/06/23 0936  WBC 11.2*  --  7.6 7.8  NEUTROABS 6.4  --  4.1 5,756  HGB 15.6 15.6 14.6 14.5  HCT 45.7 46.0 42.5 42.9  MCV 91.2  --  89.7 89.6  PLT 297  --  242 260   Lipid Panel: Recent Labs    11/08/22 0945  CHOL 169  HDL 42  LDLCALC 99  TRIG 182*  CHOLHDL 4.0   TSH: No results for input(s): "TSH" in the last 8760 hours. A1C: No results found for: "HGBA1C"   Assessment/Plan  1. Swelling of lower extremity Patient does not appear to be in distress Lung exam, clear no wheezing, no crackles  Instructed patient to elevate his feet, use compression stockings Avoid salty foods Will increase Lasix to 60 mg daily for a week We will check BMP, BNP - Basic Metabolic Panel with eGFR - Brain Natriuretic Peptide  2. Moderate persistent asthma without complication No wheezing on exam Lungs clear Instructed patient to start using Symbicort twice daily and use albuterol as needed Instructed patient to stop smoking marijuana - budesonide-formoterol (SYMBICORT) 160-4.5 MCG/ACT inhaler; Inhale 2 puffs into the lungs 2 (two) times daily.  Dispense: 1 each; Refill: 3  3. Primary hypertension Blood pressure is high Patient is asymptomatic Denies chest pain, headache, dizziness, shortness of breath He did not bring his medications and does not remember or know what he is taking Will increase Lasix to 60 mg daily Instructed patient to bring all his medications next Tuesday Monitor blood pressure, keep a log Avoid salty foods  4. Need for influenza vaccination   - Flu Vaccine Trivalent High Dose  (Fluad)   No follow-ups on file.:  next week

## 2023-08-28 NOTE — Patient Instructions (Addendum)
Use compression stockings Take lasix 3 tab daily until next week and bring all your meds

## 2023-08-29 LAB — BRAIN NATRIURETIC PEPTIDE: Brain Natriuretic Peptide: 31 pg/mL (ref <100)

## 2023-08-29 LAB — BASIC METABOLIC PANEL WITH GFR
BUN: 15 mg/dL (ref 7–25)
CO2: 29 mmol/L (ref 20–32)
Calcium: 9.6 mg/dL (ref 8.6–10.3)
Chloride: 103 mmol/L (ref 98–110)
Creat: 0.98 mg/dL (ref 0.70–1.28)
Glucose, Bld: 95 mg/dL (ref 65–99)
Potassium: 3.6 mmol/L (ref 3.5–5.3)
Sodium: 142 mmol/L (ref 135–146)
eGFR: 80 mL/min/{1.73_m2} (ref >=60)

## 2023-09-01 ENCOUNTER — Other Ambulatory Visit: Payer: Self-pay | Admitting: Family Medicine

## 2023-09-01 DIAGNOSIS — R609 Edema, unspecified: Secondary | ICD-10-CM

## 2023-09-03 ENCOUNTER — Encounter: Payer: Self-pay | Admitting: Sports Medicine

## 2023-09-03 ENCOUNTER — Ambulatory Visit (INDEPENDENT_AMBULATORY_CARE_PROVIDER_SITE_OTHER): Payer: Federal, State, Local not specified - PPO | Admitting: Sports Medicine

## 2023-09-03 VITALS — BP 140/110 | HR 81 | Temp 97.8°F | Resp 18 | Ht 74.0 in | Wt 255.8 lb

## 2023-09-03 DIAGNOSIS — I10 Essential (primary) hypertension: Secondary | ICD-10-CM

## 2023-09-03 DIAGNOSIS — M7989 Other specified soft tissue disorders: Secondary | ICD-10-CM

## 2023-09-03 DIAGNOSIS — E876 Hypokalemia: Secondary | ICD-10-CM

## 2023-09-03 MED ORDER — POTASSIUM CHLORIDE CRYS ER 20 MEQ PO TBCR: 20.0000 meq | EXTENDED_RELEASE_TABLET | Freq: Every day | ORAL | 1 refills | Status: DC

## 2023-09-03 MED ORDER — HYDRALAZINE HCL 50 MG PO TABS: 50.0000 mg | ORAL_TABLET | Freq: Three times a day (TID) | ORAL | 3 refills | Status: DC

## 2023-09-03 NOTE — Patient Instructions (Addendum)
Take hydralazine 50 mg three times daily  Continue valsartan / hydrochlorothiazide Stop amldoipine , lasix  Use compression stockings  Check blood pressure  every  day at same time each day and keep a log

## 2023-09-03 NOTE — Progress Notes (Signed)
Careteam: Patient Care Team: Venita Sheffield, MD as PCP - General (Internal Medicine)  PLACE OF SERVICE:  [facility]  Advanced Directive information Does Patient Have a Medical Advance Directive?: No, Would patient like information on creating a medical advance directive?: No - Patient declined  No Known Allergies  Chief Complaint  Patient presents with   Follow-up    1 week follow up on leg swelling.      HPI: Patient is a 77 y.o. male is here for follow up on his HTN  Pt brought all his medications  He was told on last visit to take lasix 3 pills and instructions were written on AVS  Pt states he was never told on what to take  He stopped taking amlodipine, lasix since last week,  States his swelling his better    HTN He is on valsartan, hydralazine  Not sure if he is taking them correctly  He checks bp occasionally, home readings running high He may be having memory deficit too, will check at next visit     Review of Systems:  Review of Systems  Constitutional:  Negative for chills and fever.  HENT:  Negative for congestion and sore throat.   Eyes:  Negative for double vision.  Respiratory:  Negative for cough, sputum production and shortness of breath.   Cardiovascular:  Negative for chest pain and palpitations.  Gastrointestinal:  Negative for abdominal pain, heartburn and nausea.  Genitourinary:  Negative for dysuria, frequency and hematuria.  Musculoskeletal:  Negative for falls and myalgias.  Neurological:  Negative for dizziness, sensory change and focal weakness.    Past Medical History:  Diagnosis Date   Arthritis    Asthma    Colon polyps    Gout    Hay fever    Hyperlipidemia    Hypertension    Positive TB test    Seizures (HCC)    Urinary retention with incomplete bladder emptying    Past Surgical History:  Procedure Laterality Date   COLONOSCOPY     HERNIA REPAIR  1998   TOOTH EXTRACTION     WRIST SURGERY  1964   Social  History:   reports that he has been smoking cigars. He has never used smokeless tobacco. He reports current alcohol use of about 1.0 standard drink of alcohol per week. He reports current drug use. Drug: Marijuana.  Family History  Problem Relation Age of Onset   Arthritis Mother    Hyperlipidemia Mother    Heart disease Mother    Hypertension Mother    Uterine cancer Mother    Arthritis Father    Hyperlipidemia Father    Heart disease Father    Hypertension Father    Tuberculosis Father    Hyperlipidemia Sister    Hypertension Sister    Heart disease Sister    Arthritis Maternal Grandmother    Arthritis Maternal Grandfather    Arthritis Paternal Grandmother    Arthritis Paternal Grandfather    Prostate cancer Neg Hx     Medications: Patient's Medications  New Prescriptions   No medications on file  Previous Medications   ACETAMINOPHEN (TYLENOL) 500 MG TABLET    Take 500 mg by mouth 2 (two) times daily as needed for mild pain.   ALBUTEROL (VENTOLIN HFA) 108 (90 BASE) MCG/ACT INHALER    Inhale 1 puff into the lungs every 6 (six) hours as needed for wheezing or shortness of breath.   ALLOPURINOL (ZYLOPRIM) 300 MG TABLET  TAKE 1 TABLET BY MOUTH EVERY DAY   BUDESONIDE-FORMOTEROL (SYMBICORT) 160-4.5 MCG/ACT INHALER    Inhale 2 puffs into the lungs 2 (two) times daily.   FINASTERIDE (PROSCAR) 5 MG TABLET    Take 5 mg by mouth daily.   HYDRALAZINE (APRESOLINE) 25 MG TABLET    Take 1 tablet (25 mg total) by mouth 3 (three) times daily.   LEVETIRACETAM (KEPPRA) 500 MG TABLET    Take 1 tablet (500 mg total) by mouth 2 (two) times daily.   MINOCYCLINE (MINOCIN) 100 MG CAPSULE    TAKE 1 CAPSULE (100 MG TOTAL) BY MOUTH DAILY AS NEEDED (URINARY PROBLEMS).   MULTIPLE VITAMIN (MULTIVITAMIN) TABLET    Take 1 tablet by mouth every other day.   POTASSIUM CHLORIDE SA (KLOR-CON M) 20 MEQ TABLET    Take 1 tablet (20 mEq total) by mouth daily.   ROSUVASTATIN (CRESTOR) 10 MG TABLET    TAKE 1 TABLET  BY MOUTH EVERY DAY   TAMSULOSIN (FLOMAX) 0.4 MG CAPS CAPSULE    Take 0.4 mg by mouth every 8 (eight) hours.   VALSARTAN-HYDROCHLOROTHIAZIDE (DIOVAN-HCT) 320-25 MG TABLET    Take 1 tablet by mouth daily.  Modified Medications   No medications on file  Discontinued Medications   FUROSEMIDE (LASIX) 20 MG TABLET    TAKE 2 TABLETS (40 MG TOTAL) BY MOUTH DAILY.    Physical Exam:  Vitals:   09/03/23 0956 09/03/23 1000  BP: (!) 150/120 (!) 160/118  Pulse: 81   Resp: 18   Temp: 97.8 F (36.6 C)   SpO2: 94%   Weight: 255 lb 12.8 oz (116 kg)   Height: 6\' 2"  (1.88 m)    Body mass index is 32.84 kg/m. Wt Readings from Last 3 Encounters:  09/03/23 255 lb 12.8 oz (116 kg)  08/28/23 257 lb 3.2 oz (116.7 kg)  06/17/23 250 lb (113.4 kg)    Physical Exam Constitutional:      Appearance: Normal appearance.  HENT:     Head: Normocephalic and atraumatic.  Cardiovascular:     Rate and Rhythm: Normal rate and regular rhythm.     Pulses: Normal pulses.     Heart sounds: Normal heart sounds.  Pulmonary:     Effort: No respiratory distress.     Breath sounds: No stridor. No wheezing or rales.  Abdominal:     General: Bowel sounds are normal. There is no distension.     Palpations: Abdomen is soft.     Tenderness: There is no abdominal tenderness. There is no right CVA tenderness or guarding.  Musculoskeletal:        General: Swelling (dorsal puffiness, leg swelling 2+ pitting odema) present.  Neurological:     Mental Status: He is alert. Mental status is at baseline.     Sensory: No sensory deficit.     Motor: No weakness.      Labs reviewed: Basic Metabolic Panel: Recent Labs    03/01/23 0443 03/01/23 1641 03/02/23 0902 03/03/23 0815 03/06/23 0936 05/15/23 0929 08/28/23 1017  NA 139  --  140 139 139 142 142  K 2.7*   < > 3.4* 3.5 3.7 3.4* 3.6  CL 103  --  105 105 103 103 103  CO2 25  --  27 25 28 29 29   GLUCOSE 102*  --  106* 107* 82 98 95  BUN 7*  --  11 11 13 13 15    CREATININE 0.96  --  0.98 0.94 0.93 0.96 0.98  CALCIUM 9.0  --  9.2 9.3 9.7 9.8 9.6  MG 2.1  --  1.9 1.9  --   --   --    < > = values in this interval not displayed.   Liver Function Tests: Recent Labs    12/08/22 0249 02/28/23 1526 03/03/23 0815  AST 33 32 18  ALT 19 18 16   ALKPHOS 75 83 69  BILITOT 1.3* 1.0 1.2  PROT 7.6 8.1 7.3  ALBUMIN 4.2 4.4 4.0   No results for input(s): "LIPASE", "AMYLASE" in the last 8760 hours. No results for input(s): "AMMONIA" in the last 8760 hours. CBC: Recent Labs    02/28/23 1526 02/28/23 1610 03/03/23 0815 03/06/23 0936  WBC 11.2*  --  7.6 7.8  NEUTROABS 6.4  --  4.1 5,756  HGB 15.6 15.6 14.6 14.5  HCT 45.7 46.0 42.5 42.9  MCV 91.2  --  89.7 89.6  PLT 297  --  242 260   Lipid Panel: Recent Labs    11/08/22 0945  CHOL 169  HDL 42  LDLCALC 99  TRIG 182*  CHOLHDL 4.0   TSH: No results for input(s): "TSH" in the last 8760 hours. A1C: No results found for: "HGBA1C"   Assessment/Plan   1. Hypokalemia  Refilled  - potassium chloride SA (KLOR-CON M) 20 MEQ tablet; Take 1 tablet (20 mEq total) by mouth daily.  Dispense: 90 tablet; Refill: 1  2. Primary hypertension  Will increase hydralazine to 50mg  tid Cont with valsartan, hydrochlorothiazide Do not resume lasix at this time Will check with the pharmacy if they can do blister package Instructed him to check his bp daily and keep a log Avoid salty foods Follow up in 4 weeks   3. Swelling of lower extremity Elevate feet  Use compression stockings Lungs clear  BNP (last 3 results) Recent Labs    08/28/23 1017  BNP 31    ProBNP (last 3 results) No results for input(s): "PROBNP" in the last 8760 hours.   Other orders - hydrALAZINE (APRESOLINE) 50 MG tablet; Take 1 tablet (50 mg total) by mouth 3 (three) times daily.  Dispense: 270 tablet; Refill: 3   No follow-ups on file.: 4 weeks, will do memory test next visit

## 2023-10-01 ENCOUNTER — Encounter: Payer: Self-pay | Admitting: Sports Medicine

## 2023-10-01 ENCOUNTER — Ambulatory Visit (INDEPENDENT_AMBULATORY_CARE_PROVIDER_SITE_OTHER): Payer: Medicare Other | Admitting: Sports Medicine

## 2023-10-01 VITALS — BP 131/88 | HR 89 | Temp 97.6°F | Resp 18 | Ht 74.0 in | Wt 256.2 lb

## 2023-10-01 DIAGNOSIS — I10 Essential (primary) hypertension: Secondary | ICD-10-CM | POA: Diagnosis not present

## 2023-10-01 DIAGNOSIS — E876 Hypokalemia: Secondary | ICD-10-CM | POA: Diagnosis not present

## 2023-10-01 DIAGNOSIS — Z23 Encounter for immunization: Secondary | ICD-10-CM

## 2023-10-01 DIAGNOSIS — F1290 Cannabis use, unspecified, uncomplicated: Secondary | ICD-10-CM

## 2023-10-01 DIAGNOSIS — R569 Unspecified convulsions: Secondary | ICD-10-CM

## 2023-10-01 DIAGNOSIS — M7989 Other specified soft tissue disorders: Secondary | ICD-10-CM

## 2023-10-01 DIAGNOSIS — J45909 Unspecified asthma, uncomplicated: Secondary | ICD-10-CM

## 2023-10-01 NOTE — Progress Notes (Signed)
Careteam: Patient Care Team: Venita Sheffield, MD as PCP - General (Internal Medicine)  PLACE OF SERVICE:  [facility]  Advanced Directive information    No Known Allergies  Chief Complaint  Patient presents with   Follow-up    4 weeks fu    Immunizations    Shingrix, Pneumonia, DTAP, and Covid      HPI: Patient is a 77 y.o. male  is here for follow up on his BP   HTN  much better  Bp today 131/ 88  Did not bring his meds with him today  Checks his bp at home , few times a week  Does stationary bike for  20 min  Denies chest pain, dizziness, sob with exertion  COPD  Smokes cannabis  daily  Counseled on cessation On Symbicort  HLD  On crestor  Lipid Panel     Component Value Date/Time   CHOL 169 11/08/2022 0945   TRIG 182 (H) 11/08/2022 0945   HDL 42 11/08/2022 0945   CHOLHDL 4.0 11/08/2022 0945   VLDL 27.8 11/08/2015 0850   LDLCALC 99 11/08/2022 0945    BPH  Denies dysuria , hematuria  Reports  weak stream  On flomax   No problems with balance No falls   Lower extremity swelling  Improved from last visit  Denies SOB Sleeps with head elevation  Stopped taking lasix        Review of Systems:  Review of Systems  Constitutional:  Negative for chills and fever.  HENT:  Negative for congestion and sore throat.   Eyes:  Negative for double vision.  Respiratory:  Negative for cough, sputum production and shortness of breath.   Cardiovascular:  Negative for chest pain, palpitations and leg swelling.  Gastrointestinal:  Negative for abdominal pain, heartburn and nausea.  Genitourinary:  Negative for dysuria, frequency and hematuria.  Musculoskeletal:  Negative for falls and myalgias.  Neurological:  Negative for dizziness, sensory change and focal weakness.    Past Medical History:  Diagnosis Date   Arthritis    Asthma    Colon polyps    Gout    Hay fever    Hyperlipidemia    Hypertension    Positive TB test    Seizures (HCC)     Urinary retention with incomplete bladder emptying    Past Surgical History:  Procedure Laterality Date   COLONOSCOPY     HERNIA REPAIR  1998   TOOTH EXTRACTION     WRIST SURGERY  1964   Social History:   reports that he has been smoking cigars. He has never used smokeless tobacco. He reports current alcohol use of about 1.0 standard drink of alcohol per week. He reports current drug use. Drug: Marijuana.  Family History  Problem Relation Age of Onset   Arthritis Mother    Hyperlipidemia Mother    Heart disease Mother    Hypertension Mother    Uterine cancer Mother    Arthritis Father    Hyperlipidemia Father    Heart disease Father    Hypertension Father    Tuberculosis Father    Hyperlipidemia Sister    Hypertension Sister    Heart disease Sister    Arthritis Maternal Grandmother    Arthritis Maternal Grandfather    Arthritis Paternal Grandmother    Arthritis Paternal Grandfather    Prostate cancer Neg Hx     Medications: Patient's Medications  New Prescriptions   No medications on file  Previous Medications  ACETAMINOPHEN (TYLENOL) 500 MG TABLET    Take 500 mg by mouth 2 (two) times daily as needed for mild pain.   ALBUTEROL (VENTOLIN HFA) 108 (90 BASE) MCG/ACT INHALER    Inhale 1 puff into the lungs every 6 (six) hours as needed for wheezing or shortness of breath.   ALLOPURINOL (ZYLOPRIM) 300 MG TABLET    TAKE 1 TABLET BY MOUTH EVERY DAY   BUDESONIDE-FORMOTEROL (SYMBICORT) 160-4.5 MCG/ACT INHALER    Inhale 2 puffs into the lungs 2 (two) times daily.   FINASTERIDE (PROSCAR) 5 MG TABLET    Take 5 mg by mouth daily.   HYDRALAZINE (APRESOLINE) 50 MG TABLET    Take 1 tablet (50 mg total) by mouth 3 (three) times daily.   LEVETIRACETAM (KEPPRA) 500 MG TABLET    Take 1 tablet (500 mg total) by mouth 2 (two) times daily.   MINOCYCLINE (MINOCIN) 100 MG CAPSULE    TAKE 1 CAPSULE (100 MG TOTAL) BY MOUTH DAILY AS NEEDED (URINARY PROBLEMS).   MULTIPLE VITAMIN (MULTIVITAMIN)  TABLET    Take 1 tablet by mouth every other day.   POTASSIUM CHLORIDE SA (KLOR-CON M) 20 MEQ TABLET    Take 1 tablet (20 mEq total) by mouth daily.   ROSUVASTATIN (CRESTOR) 10 MG TABLET    TAKE 1 TABLET BY MOUTH EVERY DAY   TAMSULOSIN (FLOMAX) 0.4 MG CAPS CAPSULE    Take 0.4 mg by mouth every 8 (eight) hours.   VALSARTAN-HYDROCHLOROTHIAZIDE (DIOVAN-HCT) 320-25 MG TABLET    Take 1 tablet by mouth daily.  Modified Medications   No medications on file  Discontinued Medications   No medications on file    Physical Exam:  Vitals:   10/01/23 1008  BP: 131/88  Pulse: 89  Resp: 18  Temp: 97.6 F (36.4 C)  SpO2: 99%  Weight: 256 lb 3.2 oz (116.2 kg)  Height: 6\' 2"  (1.88 m)   Body mass index is 32.89 kg/m. Wt Readings from Last 3 Encounters:  10/01/23 256 lb 3.2 oz (116.2 kg)  09/03/23 255 lb 12.8 oz (116 kg)  08/28/23 257 lb 3.2 oz (116.7 kg)    Physical Exam Constitutional:      Appearance: Normal appearance.  HENT:     Head: Normocephalic and atraumatic.  Cardiovascular:     Rate and Rhythm: Normal rate and regular rhythm.     Pulses: Normal pulses.     Heart sounds: Normal heart sounds.  Pulmonary:     Effort: No respiratory distress.     Breath sounds: No stridor. No wheezing or rales.  Abdominal:     General: Bowel sounds are normal. There is no distension.     Palpations: Abdomen is soft.     Tenderness: There is no abdominal tenderness. There is no right CVA tenderness or guarding.  Musculoskeletal:        General: Swelling (pedal puffiness bilaterally) present.  Neurological:     Mental Status: He is alert. Mental status is at baseline.     Sensory: No sensory deficit.     Motor: No weakness.     Labs reviewed: Basic Metabolic Panel: Recent Labs    03/01/23 0443 03/01/23 1641 03/02/23 0902 03/03/23 0815 03/06/23 0936 05/15/23 0929 08/28/23 1017  NA 139  --  140 139 139 142 142  K 2.7*   < > 3.4* 3.5 3.7 3.4* 3.6  CL 103  --  105 105 103 103 103   CO2 25  --  27 25 28  29 29  GLUCOSE 102*  --  106* 107* 82 98 95  BUN 7*  --  11 11 13 13 15   CREATININE 0.96  --  0.98 0.94 0.93 0.96 0.98  CALCIUM 9.0  --  9.2 9.3 9.7 9.8 9.6  MG 2.1  --  1.9 1.9  --   --   --    < > = values in this interval not displayed.   Liver Function Tests: Recent Labs    12/08/22 0249 02/28/23 1526 03/03/23 0815  AST 33 32 18  ALT 19 18 16   ALKPHOS 75 83 69  BILITOT 1.3* 1.0 1.2  PROT 7.6 8.1 7.3  ALBUMIN 4.2 4.4 4.0   No results for input(s): "LIPASE", "AMYLASE" in the last 8760 hours. No results for input(s): "AMMONIA" in the last 8760 hours. CBC: Recent Labs    02/28/23 1526 02/28/23 1610 03/03/23 0815 03/06/23 0936  WBC 11.2*  --  7.6 7.8  NEUTROABS 6.4  --  4.1 5,756  HGB 15.6 15.6 14.6 14.5  HCT 45.7 46.0 42.5 42.9  MCV 91.2  --  89.7 89.6  PLT 297  --  242 260   Lipid Panel: Recent Labs    11/08/22 0945  CHOL 169  HDL 42  LDLCALC 99  TRIG 182*  CHOLHDL 4.0   TSH: No results for input(s): "TSH" in the last 8760 hours. A1C: No results found for: "HGBA1C"   Assessment/Plan  1. Primary hypertension At goal  Cont with the same   2. Swelling of lower extremity Swelling in his legs improved  Off the amlodipine   3. Hypokalemia Will check potassium  - Potassium  4. Moderate asthma, unspecified whether complicated, unspecified whether persistent No wheezing  Cont with symbicort  5. Seizures (HCC) No recent seizures Cont with keppra  6. Marijuana use Informed patient about the adverse effects of  marijuana  Counseled on stop smoking marijuana   T   No follow-ups on file.:  4 months     I spent greater than 40 minutes for the care of this patient in face to face time, chart review, clinical documentation, patient education.

## 2023-10-02 LAB — POTASSIUM: Potassium: 3.8 mmol/L (ref 3.5–5.3)

## 2023-11-20 ENCOUNTER — Encounter: Payer: Self-pay | Admitting: Sports Medicine

## 2023-11-20 ENCOUNTER — Ambulatory Visit (INDEPENDENT_AMBULATORY_CARE_PROVIDER_SITE_OTHER): Payer: Medicare Other | Admitting: Sports Medicine

## 2023-11-20 VITALS — BP 142/78 | HR 99 | Temp 96.6°F | Resp 17 | Ht 74.0 in | Wt 247.6 lb

## 2023-11-20 DIAGNOSIS — R569 Unspecified convulsions: Secondary | ICD-10-CM | POA: Diagnosis not present

## 2023-11-20 DIAGNOSIS — M109 Gout, unspecified: Secondary | ICD-10-CM

## 2023-11-20 DIAGNOSIS — I10 Essential (primary) hypertension: Secondary | ICD-10-CM

## 2023-11-20 DIAGNOSIS — E785 Hyperlipidemia, unspecified: Secondary | ICD-10-CM | POA: Diagnosis not present

## 2023-11-20 DIAGNOSIS — M7989 Other specified soft tissue disorders: Secondary | ICD-10-CM

## 2023-11-20 MED ORDER — KETOCONAZOLE 2 % EX CREA: 1.0000 | TOPICAL_CREAM | Freq: Every day | CUTANEOUS | 0 refills | Status: AC

## 2023-11-20 NOTE — Progress Notes (Signed)
Careteam: Patient Care Team: Venita Sheffield, MD as PCP - General (Internal Medicine)  PLACE OF SERVICE:  [facility]  Advanced Directive information Does Patient Have a Medical Advance Directive?: No, Does patient want to make changes to medical advance directive?: No - Patient declined  No Known Allergies  Chief Complaint  Patient presents with   Medical Management of Chronic Issues    6 month follow up.    Immunizations    Discuss the need for DTAP vaccine, and Covid vaccine.     HPI: Patient is a 77 y.o. male is here for follow up  States he is doing fine Pt states that her upper belly cramps with coughing and sneezing   Depression  States that some of his friends passed away and feels depressed Phq -2 -score 1  States he exercises couple of times  a week  He likes to do small projects at home Reports good appetite  Sleeping fine Denies  having suicidal ideation    HTN Bp high today  Did not bring his meds with him  Does not check his bp   Lower extremity swelling  Improved Pt stopped drinking sodas  Trying to eat healthy snacks  Athlete's feet  Requesting refill  Gout no recent flare up  On allopurinol   Marijuana  Smokes marijuana daily    Review of Systems:  Review of Systems  Constitutional:  Negative for chills and fever.  HENT:  Negative for congestion and sore throat.   Eyes:  Negative for double vision.  Respiratory:  Negative for cough, sputum production and shortness of breath.   Cardiovascular:  Negative for chest pain, palpitations and leg swelling.  Gastrointestinal:  Negative for abdominal pain, constipation, heartburn and nausea.  Genitourinary:  Negative for dysuria, frequency and hematuria.  Musculoskeletal:  Negative for falls.  Neurological:  Negative for dizziness, sensory change and focal weakness.  Psychiatric/Behavioral:  Positive for depression and substance abuse. Negative for suicidal ideas.    Negative unless  indicated in HPI.   Past Medical History:  Diagnosis Date   Arthritis    Asthma    Colon polyps    Gout    Hay fever    Hyperlipidemia    Hypertension    Positive TB test    Seizures (HCC)    Urinary retention with incomplete bladder emptying    Past Surgical History:  Procedure Laterality Date   COLONOSCOPY     HERNIA REPAIR  1998   TOOTH EXTRACTION     WRIST SURGERY  1964   Social History:   reports that he has been smoking cigars. He has never used smokeless tobacco. He reports current alcohol use of about 1.0 standard drink of alcohol per week. He reports current drug use. Drug: Marijuana.  Family History  Problem Relation Age of Onset   Arthritis Mother    Hyperlipidemia Mother    Heart disease Mother    Hypertension Mother    Uterine cancer Mother    Arthritis Father    Hyperlipidemia Father    Heart disease Father    Hypertension Father    Tuberculosis Father    Hyperlipidemia Sister    Hypertension Sister    Heart disease Sister    Arthritis Maternal Grandmother    Arthritis Maternal Grandfather    Arthritis Paternal Grandmother    Arthritis Paternal Grandfather    Prostate cancer Neg Hx     Medications: Patient's Medications  New Prescriptions   No medications  on file  Previous Medications   ACETAMINOPHEN (TYLENOL) 500 MG TABLET    Take 500 mg by mouth 2 (two) times daily as needed for mild pain.   ALBUTEROL (VENTOLIN HFA) 108 (90 BASE) MCG/ACT INHALER    Inhale 1 puff into the lungs every 6 (six) hours as needed for wheezing or shortness of breath.   ALLOPURINOL (ZYLOPRIM) 300 MG TABLET    TAKE 1 TABLET BY MOUTH EVERY DAY   BUDESONIDE-FORMOTEROL (SYMBICORT) 160-4.5 MCG/ACT INHALER    Inhale 2 puffs into the lungs 2 (two) times daily.   FINASTERIDE (PROSCAR) 5 MG TABLET    Take 5 mg by mouth daily.   HYDRALAZINE (APRESOLINE) 50 MG TABLET    Take 1 tablet (50 mg total) by mouth 3 (three) times daily.   LEVETIRACETAM (KEPPRA) 500 MG TABLET    Take 1  tablet (500 mg total) by mouth 2 (two) times daily.   MINOCYCLINE (MINOCIN) 100 MG CAPSULE    TAKE 1 CAPSULE (100 MG TOTAL) BY MOUTH DAILY AS NEEDED (URINARY PROBLEMS).   MULTIPLE VITAMIN (MULTIVITAMIN) TABLET    Take 1 tablet by mouth every other day.   POTASSIUM CHLORIDE SA (KLOR-CON M) 20 MEQ TABLET    Take 1 tablet (20 mEq total) by mouth daily.   ROSUVASTATIN (CRESTOR) 10 MG TABLET    TAKE 1 TABLET BY MOUTH EVERY DAY   TAMSULOSIN (FLOMAX) 0.4 MG CAPS CAPSULE    Take 0.4 mg by mouth every 8 (eight) hours.   VALSARTAN-HYDROCHLOROTHIAZIDE (DIOVAN-HCT) 320-25 MG TABLET    Take 1 tablet by mouth daily.  Modified Medications   No medications on file  Discontinued Medications   No medications on file    Physical Exam: Vitals:   11/20/23 0919  BP: (!) 160/102  Pulse: 99  Resp: 17  Temp: (!) 96.6 F (35.9 C)  SpO2: 98%  Weight: 247 lb 9.6 oz (112.3 kg)  Height: 6\' 2"  (1.88 m)   Body mass index is 31.79 kg/m. BP Readings from Last 3 Encounters:  11/20/23 (!) 160/102  10/01/23 131/88  09/03/23 (!) 140/110   Wt Readings from Last 3 Encounters:  11/20/23 247 lb 9.6 oz (112.3 kg)  10/01/23 256 lb 3.2 oz (116.2 kg)  09/03/23 255 lb 12.8 oz (116 kg)    Physical Exam Constitutional:      Appearance: Normal appearance.  HENT:     Head: Normocephalic and atraumatic.  Cardiovascular:     Rate and Rhythm: Normal rate and regular rhythm.     Pulses: Normal pulses.     Heart sounds: Normal heart sounds.  Pulmonary:     Effort: No respiratory distress.     Breath sounds: No stridor. No wheezing or rales.  Abdominal:     General: Bowel sounds are normal. There is no distension.     Palpations: Abdomen is soft.     Tenderness: There is no abdominal tenderness. There is no right CVA tenderness or guarding.  Musculoskeletal:     Comments: Swelling improved in his legs Has minimal dorsal puffiness in both feet    Neurological:     Mental Status: He is alert. Mental status is at  baseline.     Sensory: No sensory deficit.     Motor: No weakness.     Labs reviewed: Basic Metabolic Panel: Recent Labs    03/01/23 0443 03/01/23 1641 03/02/23 0902 03/03/23 0815 03/06/23 0936 05/15/23 0929 08/28/23 1017 10/01/23 1048  NA 139  --  140 139  139 142 142  --   K 2.7*   < > 3.4* 3.5 3.7 3.4* 3.6 3.8  CL 103  --  105 105 103 103 103  --   CO2 25  --  27 25 28 29 29   --   GLUCOSE 102*  --  106* 107* 82 98 95  --   BUN 7*  --  11 11 13 13 15   --   CREATININE 0.96  --  0.98 0.94 0.93 0.96 0.98  --   CALCIUM 9.0  --  9.2 9.3 9.7 9.8 9.6  --   MG 2.1  --  1.9 1.9  --   --   --   --    < > = values in this interval not displayed.   Liver Function Tests: Recent Labs    12/08/22 0249 02/28/23 1526 03/03/23 0815  AST 33 32 18  ALT 19 18 16   ALKPHOS 75 83 69  BILITOT 1.3* 1.0 1.2  PROT 7.6 8.1 7.3  ALBUMIN 4.2 4.4 4.0   No results for input(s): "LIPASE", "AMYLASE" in the last 8760 hours. No results for input(s): "AMMONIA" in the last 8760 hours. CBC: Recent Labs    02/28/23 1526 02/28/23 1610 03/03/23 0815 03/06/23 0936  WBC 11.2*  --  7.6 7.8  NEUTROABS 6.4  --  4.1 5,756  HGB 15.6 15.6 14.6 14.5  HCT 45.7 46.0 42.5 42.9  MCV 91.2  --  89.7 89.6  PLT 297  --  242 260   Lipid Panel: No results for input(s): "CHOL", "HDL", "LDLCALC", "TRIG", "CHOLHDL", "LDLDIRECT" in the last 8760 hours. TSH: No results for input(s): "TSH" in the last 8760 hours. A1C: No results found for: "HGBA1C"   Assessment/Plan 1. Primary hypertension Repeat bp improved Instructed patient to monitor bp daily Avoid salty foods Instructed to bring all his medicines at next visit   2. Gout of foot, unspecified cause, unspecified chronicity, unspecified laterality No recent flare up  Will cont with allopurinol  Will check uric acid levels with labs at next visit   3. Hyperlipidemia, unspecified hyperlipidemia type Cont with lipitor  4. Seizures (HCC) Cont with  keppra  5. Swelling of lower extremity Improved elevate feet  Use compression stockings   Other orders - ketoconazole (NIZORAL) 2 % cream; Apply 1 Application topically daily.  Dispense: 15 g; Refill: 0  No follow-ups on file.:    30 Total time spent for obtaining history,  performing a medically appropriate examination and evaluation, reviewing the tests,  documenting clinical information in the electronic or other health record, independently interpreting results ,care coordination (not separately reported)

## 2023-12-17 ENCOUNTER — Ambulatory Visit: Payer: Medicare Other | Admitting: Sports Medicine

## 2024-01-14 ENCOUNTER — Other Ambulatory Visit: Payer: Self-pay | Admitting: Family Medicine

## 2024-01-19 ENCOUNTER — Other Ambulatory Visit: Payer: Self-pay | Admitting: Family Medicine

## 2024-02-19 ENCOUNTER — Encounter: Payer: Self-pay | Admitting: Sports Medicine

## 2024-02-19 ENCOUNTER — Ambulatory Visit (INDEPENDENT_AMBULATORY_CARE_PROVIDER_SITE_OTHER): Payer: Medicare Other | Admitting: Sports Medicine

## 2024-02-19 VITALS — BP 138/98 | HR 96 | Temp 97.3°F | Resp 17 | Ht 74.0 in | Wt 240.6 lb

## 2024-02-19 DIAGNOSIS — I10 Essential (primary) hypertension: Secondary | ICD-10-CM | POA: Diagnosis not present

## 2024-02-19 DIAGNOSIS — R569 Unspecified convulsions: Secondary | ICD-10-CM | POA: Diagnosis not present

## 2024-02-19 DIAGNOSIS — M109 Gout, unspecified: Secondary | ICD-10-CM | POA: Diagnosis not present

## 2024-02-19 DIAGNOSIS — J45909 Unspecified asthma, uncomplicated: Secondary | ICD-10-CM

## 2024-02-19 DIAGNOSIS — M7989 Other specified soft tissue disorders: Secondary | ICD-10-CM

## 2024-02-19 DIAGNOSIS — E785 Hyperlipidemia, unspecified: Secondary | ICD-10-CM

## 2024-02-19 DIAGNOSIS — F1029 Alcohol dependence with unspecified alcohol-induced disorder: Secondary | ICD-10-CM

## 2024-02-19 DIAGNOSIS — F17290 Nicotine dependence, other tobacco product, uncomplicated: Secondary | ICD-10-CM

## 2024-02-19 LAB — COMPLETE METABOLIC PANEL WITH GFR
AG Ratio: 1.7 (calc) (ref 1.0–2.5)
ALT: 22 U/L (ref 9–46)
AST: 20 U/L (ref 10–35)
Albumin: 4.8 g/dL (ref 3.6–5.1)
Alkaline phosphatase (APISO): 68 U/L (ref 35–144)
BUN: 11 mg/dL (ref 7–25)
CO2: 31 mmol/L (ref 20–32)
Calcium: 9.7 mg/dL (ref 8.6–10.3)
Chloride: 101 mmol/L (ref 98–110)
Creat: 0.82 mg/dL (ref 0.70–1.28)
Globulin: 2.8 g/dL (ref 1.9–3.7)
Glucose, Bld: 97 mg/dL (ref 65–99)
Potassium: 3.5 mmol/L (ref 3.5–5.3)
Sodium: 141 mmol/L (ref 135–146)
Total Bilirubin: 0.7 mg/dL (ref 0.2–1.2)
Total Protein: 7.6 g/dL (ref 6.1–8.1)
eGFR: 91 mL/min/{1.73_m2} (ref >=60)

## 2024-02-19 LAB — LIPID PANEL
Cholesterol: 169 mg/dL (ref <200)
HDL: 43 mg/dL (ref >=40)
LDL Cholesterol (Calc): 106 mg/dL — ABNORMAL HIGH
Non-HDL Cholesterol (Calc): 126 mg/dL (ref <130)
Total CHOL/HDL Ratio: 3.9 (calc) (ref <5.0)
Triglycerides: 106 mg/dL (ref <150)

## 2024-02-19 LAB — URIC ACID: Uric Acid, Serum: 4.3 mg/dL (ref 4.0–8.0)

## 2024-02-19 NOTE — Progress Notes (Signed)
 Careteam: Patient Care Team: Edward Sheffield, MD as PCP - General (Internal Medicine)  PLACE OF SERVICE:  [facility]  Advanced Directive information Does Patient Have a Medical Advance Directive?: No, Would patient like information on creating a medical advance directive?: No - Patient declined  No Known Allergies  Chief Complaint  Patient presents with   Medical Management of Chronic Issues    3 month follow up.    Immunizations    Discuss the need for DTAP vaccine, and Covid vaccine.      Discussed the use of AI scribe software for clinical note transcription with the patient, who gave verbal consent to proceed.  History of Present Illness   He is a 78 year old with hypertension who presents for a routine follow-up visit.  He monitors his blood pressure at home, which typically ranges between 135-140/80-89 mmHg. He is currently taking hydralazine 50 mg three times a day, valsartan, hydrochlorothiazide  No dizziness or lightheadedness.  He uses an albuterol inhaler once a day, which he finds effective. He smokes one cigar daily . C/o intermittent dry cough but no  breathing difficulties.  He has a history of gout but has not experienced any recent flare-ups, attributing this to his weight loss. He is on allopurinol for gout management.  BPH He experiences nocturia twice per night and occasional dribbling of urine at night.  Denies dysuria  He reports numbness in his fingertips but no burning pain   He has lost weight and maintains a healthy diet with no sugar intake. He exercises, although not as much as he would like, and consumes one alcoholic drink and one cigar daily. No recent falls and no joint pain, back, knee, or abdominal issues. No breathing difficulties during physical activity.         Review of Systems:  Review of Systems  Constitutional:  Negative for chills and fever.  HENT:  Negative for congestion and sore throat.   Eyes:  Negative for double  vision.  Respiratory:  Negative for cough, sputum production and shortness of breath.   Cardiovascular:  Negative for chest pain, palpitations and leg swelling.  Gastrointestinal:  Negative for abdominal pain, heartburn and nausea.  Genitourinary:  Negative for dysuria, frequency and hematuria.  Musculoskeletal:  Negative for falls and myalgias.  Neurological:  Negative for dizziness, sensory change and focal weakness.   Negative unless indicated in HPI.   Past Medical History:  Diagnosis Date   Arthritis    Asthma    Colon polyps    Gout    Hay fever    Hyperlipidemia    Hypertension    Positive TB test    Seizures (HCC)    Urinary retention with incomplete bladder emptying    Past Surgical History:  Procedure Laterality Date   COLONOSCOPY     HERNIA REPAIR  1998   TOOTH EXTRACTION     WRIST SURGERY  1964   Social History:   reports that he has been smoking cigars. He has never used smokeless tobacco. He reports current alcohol use of about 1.0 standard drink of alcohol per week. He reports current drug use. Drug: Marijuana.  Family History  Problem Relation Age of Onset   Arthritis Mother    Hyperlipidemia Mother    Heart disease Mother    Hypertension Mother    Uterine cancer Mother    Arthritis Father    Hyperlipidemia Father    Heart disease Father    Hypertension Father  Tuberculosis Father    Hyperlipidemia Sister    Hypertension Sister    Heart disease Sister    Arthritis Maternal Grandmother    Arthritis Maternal Grandfather    Arthritis Paternal Grandmother    Arthritis Paternal Grandfather    Prostate cancer Neg Hx     Medications: Patient's Medications  New Prescriptions   No medications on file  Previous Medications   ACETAMINOPHEN (TYLENOL) 500 MG TABLET    Take 500 mg by mouth 2 (two) times daily as needed for mild pain.   ALBUTEROL (VENTOLIN HFA) 108 (90 BASE) MCG/ACT INHALER    Inhale 1 puff into the lungs every 6 (six) hours as needed  for wheezing or shortness of breath.   ALLOPURINOL (ZYLOPRIM) 300 MG TABLET    TAKE 1 TABLET BY MOUTH EVERY DAY   BUDESONIDE-FORMOTEROL (SYMBICORT) 160-4.5 MCG/ACT INHALER    Inhale 2 puffs into the lungs 2 (two) times daily.   FINASTERIDE (PROSCAR) 5 MG TABLET    Take 5 mg by mouth daily.   HYDRALAZINE (APRESOLINE) 50 MG TABLET    Take 1 tablet (50 mg total) by mouth 3 (three) times daily.   KETOCONAZOLE (NIZORAL) 2 % CREAM    Apply 1 Application topically daily.   LEVETIRACETAM (KEPPRA) 500 MG TABLET    Take 1 tablet (500 mg total) by mouth 2 (two) times daily.   MINOCYCLINE (MINOCIN) 100 MG CAPSULE    TAKE 1 CAPSULE (100 MG TOTAL) BY MOUTH DAILY AS NEEDED (URINARY PROBLEMS).   MULTIPLE VITAMIN (MULTIVITAMIN) TABLET    Take 1 tablet by mouth every other day.   POTASSIUM CHLORIDE SA (KLOR-CON M) 20 MEQ TABLET    Take 1 tablet (20 mEq total) by mouth daily.   ROSUVASTATIN (CRESTOR) 10 MG TABLET    TAKE 1 TABLET BY MOUTH EVERY DAY   TAMSULOSIN (FLOMAX) 0.4 MG CAPS CAPSULE    Take 0.4 mg by mouth every 8 (eight) hours.   VALSARTAN-HYDROCHLOROTHIAZIDE (DIOVAN-HCT) 320-25 MG TABLET    TAKE 1 TABLET BY MOUTH EVERY DAY  Modified Medications   No medications on file  Discontinued Medications   No medications on file    Physical Exam: Vitals:   02/19/24 0917  BP: (!) 140/100  Pulse: 96  Resp: 17  Temp: (!) 97.3 F (36.3 C)  SpO2: 98%  Weight: 240 lb 9.6 oz (109.1 kg)  Height: 6\' 2"  (1.88 m)   Body mass index is 30.89 kg/m. BP Readings from Last 3 Encounters:  02/19/24 (!) 140/100  11/20/23 (!) 142/78  10/01/23 131/88   Wt Readings from Last 3 Encounters:  02/19/24 240 lb 9.6 oz (109.1 kg)  11/20/23 247 lb 9.6 oz (112.3 kg)  10/01/23 256 lb 3.2 oz (116.2 kg)    Physical Exam Constitutional:      Appearance: Normal appearance.  HENT:     Head: Normocephalic and atraumatic.  Cardiovascular:     Rate and Rhythm: Normal rate and regular rhythm.     Pulses: Normal pulses.      Heart sounds: Normal heart sounds.  Pulmonary:     Effort: No respiratory distress.     Breath sounds: No stridor. No wheezing or rales.  Abdominal:     General: Bowel sounds are normal. There is no distension.     Palpations: Abdomen is soft.     Tenderness: There is no abdominal tenderness. There is no guarding.  Musculoskeletal:        General: No swelling.  Neurological:  Mental Status: He is alert. Mental status is at baseline.     Motor: No weakness.     Labs reviewed: Basic Metabolic Panel: Recent Labs    03/01/23 0443 03/01/23 1641 03/02/23 0902 03/03/23 0815 03/06/23 0936 05/15/23 0929 08/28/23 1017 10/01/23 1048  NA 139  --  140 139 139 142 142  --   K 2.7*   < > 3.4* 3.5 3.7 3.4* 3.6 3.8  CL 103  --  105 105 103 103 103  --   CO2 25  --  27 25 28 29 29   --   GLUCOSE 102*  --  106* 107* 82 98 95  --   BUN 7*  --  11 11 13 13 15   --   CREATININE 0.96  --  0.98 0.94 0.93 0.96 0.98  --   CALCIUM 9.0  --  9.2 9.3 9.7 9.8 9.6  --   MG 2.1  --  1.9 1.9  --   --   --   --    < > = values in this interval not displayed.   Liver Function Tests: Recent Labs    02/28/23 1526 03/03/23 0815  AST 32 18  ALT 18 16  ALKPHOS 83 69  BILITOT 1.0 1.2  PROT 8.1 7.3  ALBUMIN 4.4 4.0   No results for input(s): "LIPASE", "AMYLASE" in the last 8760 hours. No results for input(s): "AMMONIA" in the last 8760 hours. CBC: Recent Labs    02/28/23 1526 02/28/23 1610 03/03/23 0815 03/06/23 0936  WBC 11.2*  --  7.6 7.8  NEUTROABS 6.4  --  4.1 5,756  HGB 15.6 15.6 14.6 14.5  HCT 45.7 46.0 42.5 42.9  MCV 91.2  --  89.7 89.6  PLT 297  --  242 260   Lipid Panel: No results for input(s): "CHOL", "HDL", "LDLCALC", "TRIG", "CHOLHDL", "LDLDIRECT" in the last 8760 hours. TSH: No results for input(s): "TSH" in the last 8760 hours. A1C: No results found for: "HGBA1C"  Assessment and Plan 1. Primary hypertension (Primary) Repeat bp  Avoid salty foods Exercise  regularly Cont with hydralazine, valsartan, hctz - Basic Metabolic Panel with eGFR - Complete Metabolic Panel with eGFR  2. Hyperlipidemia, unspecified hyperlipidemia type Will check lipid panel - Lipid Panel  3. Gout of foot, unspecified cause, unspecified chronicity, unspecified laterality No recent flare up - Uric Acid  4. Seizures (HCC) Cont with keppra  5. Swelling of lower extremity improved - Basic Metabolic Panel with eGFR  6. Moderate asthma, unspecified whether complicated, unspecified whether persistent No wheezing Cont with symbicort  7. 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 Cigar smoker Not interested in quitting When trying to order low dose CT chest , it says pt needs to pay for the scan, pt not interested in paying , declined CT scan

## 2024-03-23 ENCOUNTER — Other Ambulatory Visit: Payer: Self-pay | Admitting: Sports Medicine

## 2024-04-19 ENCOUNTER — Other Ambulatory Visit: Payer: Self-pay | Admitting: Sports Medicine

## 2024-04-19 DIAGNOSIS — E876 Hypokalemia: Secondary | ICD-10-CM

## 2024-05-08 ENCOUNTER — Other Ambulatory Visit: Admitting: Sports Medicine

## 2024-05-08 MED ORDER — ALLOPURINOL 300 MG PO TABS: 300.0000 mg | ORAL_TABLET | Freq: Every day | ORAL | 1 refills | Status: AC

## 2024-05-08 NOTE — Telephone Encounter (Signed)
 Last Fill: 07/12/23  Last OV: 02/19/24 Next OV: 05/26/24  Routing to provider for review/authorization.   Copied from CRM [id number]. Topic: Clinical - Medication Refill >> May 08, 2024 12:57 PM Tiffany H wrote: Medication: allopurinol  (ZYLOPRIM ) 300 MG tablet [[id number]]  Has the patient contacted their pharmacy? Yes (Agent: If no, request that the patient contact the pharmacy for the refill. If patient does not wish to contact the pharmacy document the reason why and proceed with request.) (Agent: If yes, when and what did the pharmacy advise?)  This is the patient's preferred pharmacy:  [facility] [phone] [facility] [place] - [address] [place] [address] Phone: [phone] Fax: [phone]  Is this the correct pharmacy for this prescription? Yes If no, delete pharmacy and type the correct one.   Has the prescription been filled recently? No  Is the patient out of the medication? Yes  Has the patient been seen for an appointment in the last year OR does the patient have an upcoming appointment? Yes  Can we respond through MyChart? No  Agent: Please be advised that Rx refills may take up to 3 business days. We ask that you follow-up with your pharmacy. >> May 08, 2024  1:00 PM Tiffany H wrote: Patient has been out of medication for a week. No urgent symptoms reported.

## 2024-05-08 NOTE — Telephone Encounter (Signed)
 Copied from CRM [id number]. Topic: Clinical - Medication Refill >> May 08, 2024 12:57 PM Tiffany H wrote: Medication: allopurinol  (ZYLOPRIM ) 300 MG tablet [[id number]]  Has the patient contacted their pharmacy? Yes (Agent: If no, request that the patient contact the pharmacy for the refill. If patient does not wish to contact the pharmacy document the reason why and proceed with request.) (Agent: If yes, when and what did the pharmacy advise?)  This is the patient's preferred pharmacy:  [facility] [phone] [facility], [place] - [address] [address] Phone: [phone] Fax: [phone]  Is this the correct pharmacy for this prescription? Yes If no, delete pharmacy and type the correct one.   Has the prescription been filled recently? No  Is the patient out of the medication? Yes  Has the patient been seen for an appointment in the last year OR does the patient have an upcoming appointment? Yes  Can we respond through MyChart? No  Agent: Please be advised that Rx refills may take up to 3 business days. We ask that you follow-up with your pharmacy.

## 2024-05-26 ENCOUNTER — Encounter: Payer: Self-pay | Admitting: Sports Medicine

## 2024-05-26 ENCOUNTER — Ambulatory Visit (INDEPENDENT_AMBULATORY_CARE_PROVIDER_SITE_OTHER): Admitting: Sports Medicine

## 2024-05-26 VITALS — BP 136/108 | HR 73 | Temp 97.7°F | Resp 12 | Ht 74.0 in | Wt 247.6 lb

## 2024-05-26 DIAGNOSIS — E785 Hyperlipidemia, unspecified: Secondary | ICD-10-CM | POA: Diagnosis not present

## 2024-05-26 DIAGNOSIS — M109 Gout, unspecified: Secondary | ICD-10-CM | POA: Diagnosis not present

## 2024-05-26 DIAGNOSIS — I10 Essential (primary) hypertension: Secondary | ICD-10-CM | POA: Diagnosis not present

## 2024-05-26 DIAGNOSIS — R569 Unspecified convulsions: Secondary | ICD-10-CM

## 2024-05-26 DIAGNOSIS — J449 Chronic obstructive pulmonary disease, unspecified: Secondary | ICD-10-CM

## 2024-05-26 NOTE — Progress Notes (Signed)
 Careteam: Patient Care Team: Tye Gall, MD as PCP - General (Internal Medicine)  PLACE OF SERVICE:  [facility]  Advanced Directive information    No Known Allergies  Chief Complaint  Patient presents with   Medical Management of Chronic Issues    3 month follow up. Discuss the need for 2nd Covid vaccine.     Discussed the use of AI scribe software for clinical note transcription with the patient, who gave verbal consent to proceed.  History of Present Illness  Edward Wolf is a 78 year old male with hypertension  who presents for a routine follow-up visit.  Blood pressure readings at home average 129/78 mmHg with a pulse of 75 bpm .  He is currently taking hydralazine  50 mg three times a day, among other medications, but is unsure of all the names of his medications. He checks BP 2-3 times/ week, does not want to check daily.  He has no issues with breathing and uses an inhaler once a day, usually before sleep or a nap, which he finds very effective. He experiences shortness of breath during physical exertion, such as working out or doing chores, but this has not worsened recently. No cough is present. He has a history of copd  but does not smoke cigarettes or cigars anymore.   He has a history of seizures. He has not had any seizures in the past couple of years and sees a neurologist regularly, with the last visit being within the past six months.   No abdominal pain, heart palpitations, heartburn, acid reflux, or pain with urination. He urinates twice a night, which he attributes to his medication regimen, specifically a diuretic. He notes that his swelling has improved significantly after reducing his sugar intake over the past two weeks.    Review of Systems:  Review of Systems  Constitutional:  Negative for chills and fever.  HENT:  Negative for congestion and sore throat.   Eyes:  Negative for double vision.  Respiratory:  Negative for cough, sputum  production and shortness of breath.   Cardiovascular:  Negative for chest pain, palpitations and leg swelling.  Gastrointestinal:  Negative for abdominal pain, heartburn and nausea.  Genitourinary:  Negative for dysuria, frequency and hematuria.  Musculoskeletal:  Negative for falls and myalgias.  Neurological:  Negative for dizziness.   Negative unless indicated in HPI.   Past Medical History:  Diagnosis Date   Arthritis    Asthma    Colon polyps    Gout    Hay fever    Hyperlipidemia    Hypertension    Positive TB test    Seizures (HCC)    Urinary retention with incomplete bladder emptying    Past Surgical History:  Procedure Laterality Date   COLONOSCOPY     HERNIA REPAIR  1998   TOOTH EXTRACTION     WRIST SURGERY  1964   Social History:   reports that he has been smoking. He has never used smokeless tobacco. He reports that he does not currently use alcohol. He reports current drug use. Drug: Marijuana.  Family History  Problem Relation Age of Onset   Arthritis Mother    Hyperlipidemia Mother    Heart disease Mother    Hypertension Mother    Uterine cancer Mother    Arthritis Father    Hyperlipidemia Father    Heart disease Father    Hypertension Father    Tuberculosis Father    Hyperlipidemia Sister  Hypertension Sister    Heart disease Sister    Arthritis Maternal Grandmother    Arthritis Maternal Grandfather    Arthritis Paternal Grandmother    Arthritis Paternal Grandfather    Prostate cancer Neg Hx     Medications: Patient's Medications  New Prescriptions   No medications on file  Previous Medications   ACETAMINOPHEN  (TYLENOL ) 500 MG TABLET    Take 500 mg by mouth 2 (two) times daily as needed for mild pain.   ALBUTEROL  (VENTOLIN  HFA) 108 (90 BASE) MCG/ACT INHALER    Inhale 1 puff into the lungs every 6 (six) hours as needed for wheezing or shortness of breath.   ALLOPURINOL  (ZYLOPRIM ) 300 MG TABLET    Take 1 tablet (300 mg total) by mouth  daily.   BUDESONIDE -FORMOTEROL  (SYMBICORT ) 160-4.5 MCG/ACT INHALER    Inhale 2 puffs into the lungs 2 (two) times daily.   FINASTERIDE  (PROSCAR ) 5 MG TABLET    Take 5 mg by mouth daily.   HYDRALAZINE  (APRESOLINE ) 50 MG TABLET    Take 1 tablet (50 mg total) by mouth 3 (three) times daily.   KETOCONAZOLE  (NIZORAL ) 2 % CREAM    Apply 1 Application topically daily.   KLOR-CON  M20 20 MEQ TABLET    TAKE 1 TABLET BY MOUTH EVERY DAY   LEVETIRACETAM  (KEPPRA ) 500 MG TABLET    Take 1 tablet (500 mg total) by mouth 2 (two) times daily.   MINOCYCLINE  (MINOCIN ) 100 MG CAPSULE    TAKE 1 CAPSULE (100 MG TOTAL) BY MOUTH DAILY AS NEEDED (URINARY PROBLEMS).   MULTIPLE VITAMIN (MULTIVITAMIN) TABLET    Take 1 tablet by mouth every other day.   ROSUVASTATIN  (CRESTOR ) 10 MG TABLET    TAKE 1 TABLET BY MOUTH EVERY DAY   TAMSULOSIN  (FLOMAX ) 0.4 MG CAPS CAPSULE    Take 0.4 mg by mouth every 8 (eight) hours.   VALSARTAN -HYDROCHLOROTHIAZIDE  (DIOVAN -HCT) 320-25 MG TABLET    TAKE 1 TABLET BY MOUTH EVERY DAY  Modified Medications   No medications on file  Discontinued Medications   No medications on file    Physical Exam: Vitals:   05/26/24 0927  BP: (!) 140/118  Pulse: 73  Resp: 12  Temp: 97.7 F (36.5 C)  TempSrc: Temporal  SpO2: 98%  Weight: 247 lb 9.6 oz (112.3 kg)  Height: 6' 2 (1.88 m)   Body mass index is 31.79 kg/m. BP Readings from Last 3 Encounters:  05/26/24 (!) 140/118  02/19/24 (!) 138/98  11/20/23 (!) 142/78   Wt Readings from Last 3 Encounters:  05/26/24 247 lb 9.6 oz (112.3 kg)  02/19/24 240 lb 9.6 oz (109.1 kg)  11/20/23 247 lb 9.6 oz (112.3 kg)    Physical Exam Constitutional:      Appearance: Normal appearance.  HENT:     Head: Normocephalic and atraumatic.   Cardiovascular:     Rate and Rhythm: Normal rate and regular rhythm.     Pulses: Normal pulses.     Heart sounds: Normal heart sounds.  Pulmonary:     Effort: No respiratory distress.     Breath sounds: No stridor. No  wheezing or rales.  Abdominal:     General: Bowel sounds are normal. There is no distension.     Palpations: Abdomen is soft.     Tenderness: There is no abdominal tenderness. There is no right CVA tenderness or guarding.   Musculoskeletal:        General: Swelling (1+ minimal swelling, chronic  much better) present.  Neurological:     Mental Status: He is alert. Mental status is at baseline.     Motor: No weakness.     Labs reviewed: Basic Metabolic Panel: Recent Labs    08/28/23 1017 10/01/23 1048 02/19/24 1005  NA 142  --  141  K 3.6 3.8 3.5  CL 103  --  101  CO2 29  --  31  GLUCOSE 95  --  97  BUN 15  --  11  CREATININE 0.98  --  0.82  CALCIUM  9.6  --  9.7   Liver Function Tests: Recent Labs    02/19/24 1005  AST 20  ALT 22  BILITOT 0.7  PROT 7.6   No results for input(s): LIPASE, AMYLASE in the last 8760 hours. No results for input(s): AMMONIA in the last 8760 hours. CBC: No results for input(s): WBC, NEUTROABS, HGB, HCT, MCV, PLT in the last 8760 hours. Lipid Panel: Recent Labs    02/19/24 1005  CHOL 169  HDL 43  LDLCALC 106*  TRIG 106  CHOLHDL 3.9   TSH: No results for input(s): TSH in the last 8760 hours. A1C: No results found for: HGBA1C  Assessment and Plan Assessment & Plan  1. Primary hypertension (Primary) Repeat bp improved slightly Instructed patient to check bp daily Avoid salty foods Cont with hydralazine , diovan  hctz  2. Seizures (HCC) Cont with keppra   3. Hyperlipidemia, unspecified hyperlipidemia type Cont with crestor   4. Gout of foot, unspecified cause, unspecified chronicity, unspecified laterality No recent flare up Cont with allopurinol   5. COPD  No wheezing on aus Cont with symbicort  Declined CT chest for lung cancer screening

## 2024-06-15 ENCOUNTER — Telehealth: Payer: Self-pay | Admitting: Neurology

## 2024-06-15 NOTE — Progress Notes (Unsigned)
 [facility]  PATIENT: Edward Wolf DOB: 1946-07-21  REQUESTING CLINICIAN: Cleotilde Garnette HERO, MD HISTORY FROM: Patient and chart review  REASON FOR VISIT: Seizure disorder   HISTORICAL  CHIEF COMPLAINT:  Chief Complaint  Patient presents with   Follow-up    Rm 15, alone, no sz since last visit,    HISTORY OF PRESENT ILLNESS:  Update 06/16/24 SS: Last visit with Dr. Gregg, Keppra  level was 19.4. No further seizures since last seen. Remains on Keppra  500 mg twice daily. Tolerating well. Stopped marijuana, no vapes or gummy. He drives a car, lives with his wife. Is independent. He is retired fully, keeps up the housework, his wife is still working.  Health has been good. He is retired from the [employer].   06/17/23 Dr. Gregg: This is a 78 year old gentleman past medical history of hypertension, hyperlipidemia, gout, who is presenting after his first generalized convulsion in March.  Patient reports that he was home, smoking marijuana and drinking alcohol and the next thing that he knows he woke up in the hospital.  He reports that wife found him on the floor having a full generalized tonic-clonic seizure.  In the hospital his MRI did show a focus of diffusion restriction in the right hippocampus and his EEG showed right focal slowing.  This was consistent with seizure activity.  He was started on Keppra  500 mg twice daily and since then has been doing well.  Denies any seizure or seizure-like activity.  He reported a year ago he had a fall and hit his head but he is not sure if it was a seizure or not, he did not go to the hospital.  He denies any side effect from the Keppra , tolerating  it well.  He denies any seizure risk factors.  Denies any other complaints or concerns.  Home, sitting in his chair   Handedness: Right handed   Onset: 02/28/2023  Seizure Type: convulsion   Current frequency: Once   Any injuries from seizures: Denies   Seizure risk factors: Denies    Previous ASMs: None   Currenty ASMs: Levetiracetam  500 mg twice daily   ASMs side effects: Denies   Brain Images: Diffusion restriction in the right hippocampus  Previous EEGs: right focal slowing    OTHER MEDICAL CONDITIONS: Hypertension, Hyperlipidemia, Gout   REVIEW OF SYSTEMS: Full 14 system review of systems performed and negative with exception of: as noted in the HPI   ALLERGIES: No Known Allergies  HOME MEDICATIONS: Outpatient Medications Prior to Visit  Medication Sig Dispense Refill   acetaminophen  (TYLENOL ) 500 MG tablet Take 500 mg by mouth 2 (two) times daily as needed for mild pain.     albuterol  (VENTOLIN  HFA) 108 (90 Base) MCG/ACT inhaler Inhale 1 puff into the lungs every 6 (six) hours as needed for wheezing or shortness of breath. 18 g 5   allopurinol  (ZYLOPRIM ) 300 MG tablet Take 1 tablet (300 mg total) by mouth daily. 90 tablet 1   budesonide -formoterol  (SYMBICORT ) 160-4.5 MCG/ACT inhaler Inhale 2 puffs into the lungs 2 (two) times daily. 1 each 3   finasteride  (PROSCAR ) 5 MG tablet Take 5 mg by mouth daily.     hydrALAZINE  (APRESOLINE ) 50 MG tablet Take 1 tablet (50 mg total) by mouth 3 (three) times daily. 270 tablet 3   ketoconazole  (NIZORAL ) 2 % cream Apply 1 Application topically daily. 15 g 0   levETIRAcetam  (KEPPRA ) 500 MG tablet Take 1 tablet (500 mg total) by mouth 2 (  two) times daily. 180 tablet 4   minocycline  (MINOCIN ) 100 MG capsule TAKE 1 CAPSULE (100 MG TOTAL) BY MOUTH DAILY AS NEEDED (URINARY PROBLEMS). 90 capsule 1   Multiple Vitamin (MULTIVITAMIN) tablet Take 1 tablet by mouth every other day.     rosuvastatin  (CRESTOR ) 10 MG tablet TAKE 1 TABLET BY MOUTH EVERY DAY 90 tablet 1   tamsulosin  (FLOMAX ) 0.4 MG CAPS capsule Take 0.4 mg by mouth every 8 (eight) hours.     valsartan -hydrochlorothiazide  (DIOVAN -HCT) 320-25 MG tablet TAKE 1 TABLET BY MOUTH EVERY DAY 90 tablet 1   KLOR-CON  M20 20 MEQ tablet TAKE 1 TABLET BY MOUTH EVERY DAY 90 tablet 1    No facility-administered medications prior to visit.    PAST MEDICAL HISTORY: Past Medical History:  Diagnosis Date   Arthritis    Asthma    Colon polyps    Gout    Hay fever    Hyperlipidemia    Hypertension    Positive TB test    Seizures (HCC)    Urinary retention with incomplete bladder emptying     PAST SURGICAL HISTORY: Past Surgical History:  Procedure Laterality Date   COLONOSCOPY     HERNIA REPAIR  1998   TOOTH EXTRACTION     WRIST SURGERY  1964    FAMILY HISTORY: Family History  Problem Relation Age of Onset   Arthritis Mother    Hyperlipidemia Mother    Heart disease Mother    Hypertension Mother    Uterine cancer Mother    Arthritis Father    Hyperlipidemia Father    Heart disease Father    Hypertension Father    Tuberculosis Father    Hyperlipidemia Sister    Hypertension Sister    Heart disease Sister    Arthritis Maternal Grandmother    Arthritis Maternal Grandfather    Arthritis Paternal Grandmother    Arthritis Paternal Grandfather    Prostate cancer Neg Hx     SOCIAL HISTORY: Social History   Socioeconomic History   Marital status: Married    Spouse name: Not on file   Number of children: Not on file   Years of education: Not on file   Highest education level: Not on file  Occupational History   Not on file  Tobacco Use   Smoking status: Former    Types: Cigarettes   Smokeless tobacco: Never  Vaping Use   Vaping status: Never Used  Substance and Sexual Activity   Alcohol use: Not Currently    Comment: nightly   Drug use: Not Currently    Types: Marijuana   Sexual activity: Yes  Other Topics Concern   Not on file  Social History Narrative   Tobacco use, amount per day now: Mariajuana    Past tobacco use, amount per day: 2 Blunts    How many years did you use tobacco: 50 years    Alcohol use (drinks per week): No Alcohol in 6 weeks ( since bladder problem)   Diet: Excellent Protein, Fruit, Veggies, Light Red Meat, NO  PORK   Do you drink/eat things with caffeine: Yes.   Marital status: Happy!                                 What year were you married? 1989   Do you live in a house, apartment, assisted living, condo, trailer, etc.? Yes, House.   Is it one or more  stories? 1   How many persons live in your home? 3   Do you have pets in your home?( please list) No   Highest Level of education completed? [employer]   Current or past profession: Transport planner, Engineer, structural.   Do you exercise?     Yes                             Type and how often? Aerobic, Cardio, and Yoga.   Do you have a living will? No   Do you have a DNR form?  No                                 If not, do you want to discuss one?   Do you have signed POA/HPOA forms?   No                     If so, please bring to you appointment      Do you have any difficulty bathing or dressing yourself? No   Do you have any difficulty preparing food or eating? No   Do you have any difficulty managing your medications? No   Do you have any difficulty managing your finances? No   Do you have any difficulty affording your medications? No   Social Drivers of Corporate investment banker Strain: Not on file  Food Insecurity: No Food Insecurity (03/01/2023)   Hunger Vital Sign    Worried About Running Out of Food in the Last Year: Never true    Ran Out of Food in the Last Year: Never true  Transportation Needs: No Transportation Needs (03/01/2023)   PRAPARE - Administrator, Civil Service (Medical): No    Lack of Transportation (Non-Medical): No  Physical Activity: Not on file  Stress: Not on file  Social Connections: Not on file  Intimate Partner Violence: Not At Risk (03/01/2023)   Humiliation, Afraid, Rape, and Kick questionnaire    Fear of Current or Ex-Partner: No    Emotionally Abused: No    Physically Abused: No    Sexually Abused: No   PHYSICAL EXAM  GENERAL EXAM/CONSTITUTIONAL: Vitals:  Vitals:   06/16/24  0920  BP: 138/76  Weight: 246 lb (111.6 kg)  Height: 6' 2 (1.88 m)    Body mass index is 31.58 kg/m. Wt Readings from Last 3 Encounters:  06/16/24 246 lb (111.6 kg)  05/26/24 247 lb 9.6 oz (112.3 kg)  02/19/24 240 lb 9.6 oz (109.1 kg)   Physical Exam  General: The patient is alert and cooperative at the time of the examination.  Skin: No significant peripheral edema is noted.  Neurologic Exam  Mental status: The patient is alert and oriented x 3 at the time of the examination. The patient has apparent normal recent and remote memory, with an apparently normal attention span and concentration ability.  Cranial nerves: Facial symmetry is present. Speech is normal, no aphasia or dysarthria is noted. Extraocular movements are full. Visual fields are full.  Motor: The patient has good strength in all 4 extremities.  Sensory examination: Soft touch sensation is symmetric on the face, arms, and legs.  Coordination: The patient has good finger-nose-finger bilaterally  Gait and station: The patient has a normal gait.   DIAGNOSTIC DATA (LABS, IMAGING, TESTING) - I reviewed patient records, labs, notes,  testing and imaging myself where available.  Lab Results  Component Value Date   WBC 7.8 03/06/2023   HGB 14.5 03/06/2023   HCT 42.9 03/06/2023   MCV 89.6 03/06/2023   PLT 260 03/06/2023      Component Value Date/Time   NA 141 02/19/2024 1005   K 3.5 02/19/2024 1005   CL 101 02/19/2024 1005   CO2 31 02/19/2024 1005   GLUCOSE 97 02/19/2024 1005   BUN 11 02/19/2024 1005   CREATININE 0.82 02/19/2024 1005   CALCIUM  9.7 02/19/2024 1005   PROT 7.6 02/19/2024 1005   ALBUMIN 4.0 03/03/2023 0815   AST 20 02/19/2024 1005   ALT 22 02/19/2024 1005   ALKPHOS 69 03/03/2023 0815   BILITOT 0.7 02/19/2024 1005   GFRNONAA >60 03/03/2023 0815   Lab Results  Component Value Date   CHOL 169 02/19/2024   HDL 43 02/19/2024   LDLCALC 106 (H) 02/19/2024   TRIG 106 02/19/2024   No  results found for: HGBA1C No results found for: VITAMINB12 No results found for: TSH  EEG 03/01/2023 - Continuous slow, right hemisphere   MRI Brain w wo contrast 03/01/2023 1. Punctate focus of diffusion restriction in the right hippocampus may reflect a small acute infarct or seizure-related change. 2. No other acute intracranial pathology or significant change since the prior brain MRI from 12/07/2022.    ASSESSMENT AND PLAN  78 y.o. year old male  with hypertension, hyperlipidemia, gout, who is presenting to establish care for his seizure.  He is currently on Keppra  500 mg twice daily, tolerating the medication very well, denies any seizure or seizure-like activity. Single seizure event in March 2024.  - Continue Keppra  500 mg twice a day - Check Keppra  level for baseline - Call for seizure activity, follow-up in 1 year  Lauraine Born, SCHARLENE, DNP  [facility] [address], [address] [place], [place] [address] [phone]

## 2024-06-15 NOTE — Telephone Encounter (Signed)
 Patient called to cancelled appointment and did not want to reschedule. Explain to the patient if do not have an early appointment with Dr. Gregg will not be able to get refill for seizure medication. Patient decided to keep appointment for tomorrow.

## 2024-06-16 ENCOUNTER — Encounter: Payer: Self-pay | Admitting: Neurology

## 2024-06-16 ENCOUNTER — Ambulatory Visit: Payer: Federal, State, Local not specified - PPO | Admitting: Neurology

## 2024-06-16 VITALS — BP 138/76 | Ht 74.0 in | Wt 246.0 lb

## 2024-06-16 DIAGNOSIS — G40909 Epilepsy, unspecified, not intractable, without status epilepticus: Secondary | ICD-10-CM | POA: Diagnosis not present

## 2024-06-16 DIAGNOSIS — R569 Unspecified convulsions: Secondary | ICD-10-CM

## 2024-06-16 MED ORDER — LEVETIRACETAM 500 MG PO TABS: 500.0000 mg | ORAL_TABLET | Freq: Two times a day (BID) | ORAL | 4 refills | Status: AC

## 2024-06-16 NOTE — Patient Instructions (Addendum)
 Great to meet you today! - Continue Keppra  500 mg twice a day - Check Keppra  level for baseline - Call for seizure activity, follow-up in 1 year Thanks!!

## 2024-06-17 ENCOUNTER — Ambulatory Visit: Payer: Self-pay | Admitting: Neurology

## 2024-06-17 LAB — LEVETIRACETAM LEVEL: Levetiracetam Lvl: 11.1 ug/mL (ref 10.0–40.0)

## 2024-07-10 ENCOUNTER — Encounter: Payer: Self-pay | Admitting: Orthopedic Surgery

## 2024-07-10 ENCOUNTER — Ambulatory Visit (INDEPENDENT_AMBULATORY_CARE_PROVIDER_SITE_OTHER): Admitting: Orthopedic Surgery

## 2024-07-10 ENCOUNTER — Ambulatory Visit: Payer: Self-pay

## 2024-07-10 VITALS — BP 142/86 | HR 82 | Temp 98.0°F | Resp 13 | Ht 74.0 in | Wt 245.6 lb

## 2024-07-10 DIAGNOSIS — R609 Edema, unspecified: Secondary | ICD-10-CM | POA: Diagnosis not present

## 2024-07-10 MED ORDER — FUROSEMIDE 20 MG PO TABS: ORAL_TABLET | ORAL | 3 refills | Status: DC

## 2024-07-10 NOTE — Telephone Encounter (Signed)
 FYI Only or Action Required?: Action required by provider: request for appointment.  Patient was last seen in primary care on 05/26/2024 by Sherlynn Madden, MD.  Called Nurse Triage reporting Foot Swelling.  Symptoms began several years ago.  Interventions attempted: Nothing.  Symptoms are: unchanged.  Triage Disposition: See PCP When Office is Open (Within 3 Days)  Patient/caregiver understands and will follow disposition?: YesCopied from CRM #[id number]. Topic: Clinical - Red Word Triage >> Jul 10, 2024 11:52 AM Alfonso ORN wrote: Red Word that prompted transfer to Nurse Triage:  swelling in feet and ankle used to take the furosemide  20 mg no longer on medication list and still have swelling in feet and ankles mainly right foot   pt. call back # [phone] Reason for Disposition  [1] MILD swelling of both ankles (i.e., pedal edema) AND [2] new-onset or getting worse  Answer Assessment - Initial Assessment Questions Pt used to be on fluid pill but no longer. Denies SOB/chest pain        1. ONSET: When did the swelling start? (e.g., minutes, hours, days)     All my life 2. LOCATION: What part of the leg is swollen?  Are both legs swollen or just one leg?     Top of toes and around ankle bones- right is worse 3. SEVERITY: How bad is the swelling? (e.g., localized; mild, moderate, severe)     Puffy on top 4. REDNESS: Is there redness or signs of infection?     denies 5. PAIN: Is the swelling painful to touch? If Yes, ask: How painful is it?   (Scale 1-10; mild, moderate or severe)     denies 6. FEVER: Do you have a fever? If Yes, ask: What is it, how was it measured, and when did it start?      denies 7. CAUSE: What do you think is causing the leg swelling?     Not sure 8. MEDICAL HISTORY: Do you have a history of blood clots (e.g., DVT), cancer, heart failure, kidney disease, or liver failure?     denies 9. RECURRENT SYMPTOM: Have you had leg  swelling before? If Yes, ask: When was the last time? What happened that time?     yes 10. OTHER SYMPTOMS: Do you have any other symptoms? (e.g., chest pain, difficulty breathing)       denies  Protocols used: Leg Swelling and Edema-A-AH

## 2024-07-10 NOTE — Telephone Encounter (Signed)
 Message routed to Greig Cluster, NP and medical assistant assigned to provider as RICK.

## 2024-07-10 NOTE — Progress Notes (Signed)
 Careteam: Patient Care Team: Sherlynn Madden, MD as PCP - General (Internal Medicine)  Seen by: Greig Cluster, AGNP-C  PLACE OF SERVICE:  [facility]  Advanced Directive information    No Known Allergies  Chief Complaint  Patient presents with   Acute Visit    Pt has been experience in ankles and feet. Pt stated this has been happening all his life.  Pt was taking furosemide .      HPI: Patient is a 78 y.o. male seen today for acute visit due to ankle swelling.   Discussed the use of AI scribe software for clinical note transcription with the patient, who gave verbal consent to proceed.  History of Present Illness    He has had persistent swelling at the top of his feet and around his ankles > 1 month. He reports having some swelling to these areas most of his life. No recent injury or fracture. He denies any pain or discomfort associated with the swelling and reports being able to move his toes and ankles without issue during activities such as working out or swimming.  Past use of furosemide , but it was discontinued awhile back. He cannot recall effectiveness of furosemide  in past. He brought furosemide  20 mg medication bottle with him today. Appears bottle expired 03/2024.   In terms of diet, he has reduced his intake of sugar, sodas, and sweet juices, opting for raw sugar in his coffee. He also avoids salty foods and does not regularly consume deli meats, hot dogs, or hamburgers.  He mentions a family history of similar swelling issues, noting that his sister and mother also experienced this condition. He has seen specialist in past for swelling, but cannot recall who it was. Appears he has seen podiatry in past for left foot ulcer and varicose veins.      Review of Systems:  Review of Systems  Constitutional: Negative.   Respiratory: Negative.  Negative for shortness of breath.   Cardiovascular:  Positive for leg swelling. Negative for chest pain.  Neurological:  Negative.   Psychiatric/Behavioral: Negative.      Past Medical History:  Diagnosis Date   Arthritis    Asthma    Colon polyps    Gout    Hay fever    Hyperlipidemia    Hypertension    Positive TB test    Seizures (HCC)    Urinary retention with incomplete bladder emptying    Past Surgical History:  Procedure Laterality Date   COLONOSCOPY     HERNIA REPAIR  1998   TOOTH EXTRACTION     WRIST SURGERY  1964   Social History:   reports that he has quit smoking. His smoking use included cigarettes. He has never used smokeless tobacco. He reports that he does not currently use alcohol. He reports that he does not currently use drugs after having used the following drugs: Marijuana.  Family History  Problem Relation Age of Onset   Arthritis Mother    Hyperlipidemia Mother    Heart disease Mother    Hypertension Mother    Uterine cancer Mother    Arthritis Father    Hyperlipidemia Father    Heart disease Father    Hypertension Father    Tuberculosis Father    Hyperlipidemia Sister    Hypertension Sister    Heart disease Sister    Arthritis Maternal Grandmother    Arthritis Maternal Grandfather    Arthritis Paternal Grandmother    Arthritis Paternal Actor  Prostate cancer Neg Hx     Medications: Patient's Medications  New Prescriptions   No medications on file  Previous Medications   ACETAMINOPHEN  (TYLENOL ) 500 MG TABLET    Take 500 mg by mouth 2 (two) times daily as needed for mild pain.   ALBUTEROL  (VENTOLIN  HFA) 108 (90 BASE) MCG/ACT INHALER    Inhale 1 puff into the lungs every 6 (six) hours as needed for wheezing or shortness of breath.   ALLOPURINOL  (ZYLOPRIM ) 300 MG TABLET    Take 1 tablet (300 mg total) by mouth daily.   BUDESONIDE -FORMOTEROL  (SYMBICORT ) 160-4.5 MCG/ACT INHALER    Inhale 2 puffs into the lungs 2 (two) times daily.   FINASTERIDE  (PROSCAR ) 5 MG TABLET    Take 5 mg by mouth daily.   HYDRALAZINE  (APRESOLINE ) 50 MG TABLET    Take 1 tablet  (50 mg total) by mouth 3 (three) times daily.   KETOCONAZOLE  (NIZORAL ) 2 % CREAM    Apply 1 Application topically daily.   KLOR-CON  M20 20 MEQ TABLET    TAKE 1 TABLET BY MOUTH EVERY DAY   LEVETIRACETAM  (KEPPRA ) 500 MG TABLET    Take 1 tablet (500 mg total) by mouth 2 (two) times daily.   MINOCYCLINE  (MINOCIN ) 100 MG CAPSULE    TAKE 1 CAPSULE (100 MG TOTAL) BY MOUTH DAILY AS NEEDED (URINARY PROBLEMS).   MULTIPLE VITAMIN (MULTIVITAMIN) TABLET    Take 1 tablet by mouth every other day.   ROSUVASTATIN  (CRESTOR ) 10 MG TABLET    TAKE 1 TABLET BY MOUTH EVERY DAY   TAMSULOSIN  (FLOMAX ) 0.4 MG CAPS CAPSULE    Take 0.4 mg by mouth every 8 (eight) hours.   VALSARTAN -HYDROCHLOROTHIAZIDE  (DIOVAN -HCT) 320-25 MG TABLET    TAKE 1 TABLET BY MOUTH EVERY DAY  Modified Medications   No medications on file  Discontinued Medications   No medications on file    Physical Exam:  Vitals:   07/10/24 1359  BP: (!) 140/86  Pulse: 82  Resp: 13  Temp: 98 F (36.7 C)  SpO2: 98%  Weight: 245 lb 9.6 oz (111.4 kg)  Height: 6' 2 (1.88 m)   Body mass index is 31.53 kg/m. Wt Readings from Last 3 Encounters:  07/10/24 245 lb 9.6 oz (111.4 kg)  06/16/24 246 lb (111.6 kg)  05/26/24 247 lb 9.6 oz (112.3 kg)    Physical Exam Vitals reviewed.  Constitutional:      General: He is not in acute distress. HENT:     Head: Normocephalic.  Eyes:     General:        Right eye: No discharge.        Left eye: No discharge.  Cardiovascular:     Rate and Rhythm: Normal rate and regular rhythm.     Pulses: Normal pulses.     Heart sounds: Normal heart sounds.  Pulmonary:     Effort: Pulmonary effort is normal.     Breath sounds: Normal breath sounds.  Abdominal:     Palpations: Abdomen is soft.  Musculoskeletal:     Cervical back: Neck supple.     Comments: 1-2 + pitting edema to dorsal foot and ankles, FROM, no apparent injury or skin breakdown  Skin:    General: Skin is warm.     Capillary Refill: Capillary  refill takes less than 2 seconds.  Neurological:     General: No focal deficit present.     Mental Status: He is alert and oriented to person, place, and time.  Psychiatric:        Mood and Affect: Mood normal.     Labs reviewed: Basic Metabolic Panel: Recent Labs    08/28/23 1017 10/01/23 1048 02/19/24 1005  NA 142  --  141  K 3.6 3.8 3.5  CL 103  --  101  CO2 29  --  31  GLUCOSE 95  --  97  BUN 15  --  11  CREATININE 0.98  --  0.82  CALCIUM  9.6  --  9.7   Liver Function Tests: Recent Labs    02/19/24 1005  AST 20  ALT 22  BILITOT 0.7  PROT 7.6   No results for input(s): LIPASE, AMYLASE in the last 8760 hours. No results for input(s): AMMONIA in the last 8760 hours. CBC: No results for input(s): WBC, NEUTROABS, HGB, HCT, MCV, PLT in the last 8760 hours. Lipid Panel: Recent Labs    02/19/24 1005  CHOL 169  HDL 43  LDLCALC 106*  TRIG 106  CHOLHDL 3.9   TSH: No results for input(s): TSH in the last 8760 hours. A1C: No results found for: HGBA1C   Assessment/Plan: 1. Edema, unspecified type (Primary) - ongoing - 1-2 pitting edema to dorsal foot and ankles - h/o varicose veins> ? Venous edema - will restart furosemide  x 2 days, then prn - recommend referral to [facility] - cont low sodium diet - consider compression stockings - furosemide  (LASIX ) 20 MG tablet; Take 2 tablets (40 mg total) by mouth daily for 2 days, THEN 1 tablet (20 mg total) daily as needed for up to 2 days (take if increased ankle edema).  Dispense: 30 tablet; Refill: 3 - AMB Referral to Peripheral Vascular Navigator  Total time: 21 minutes. Greater than 50% of total time spent doing patient education regarding ankle edema including symptom/medication management.    Next appt: Visit date not found  Montey Ebel Gil BODILY  [facility] [phone]

## 2024-07-10 NOTE — Patient Instructions (Signed)
 Take furosemide  every morning x 2 days ( it is increased dose), then take as needed for ankle swelling  Referral made to [facility]  Limit salt intake, < 2000 mg daily

## 2024-08-26 ENCOUNTER — Ambulatory Visit: Admitting: Sports Medicine

## 2024-08-27 ENCOUNTER — Other Ambulatory Visit: Payer: Self-pay

## 2024-08-27 ENCOUNTER — Other Ambulatory Visit: Payer: Self-pay | Admitting: Family Medicine

## 2024-08-27 ENCOUNTER — Other Ambulatory Visit: Payer: Self-pay | Admitting: Sports Medicine

## 2024-08-27 MED ORDER — HYDRALAZINE HCL 50 MG PO TABS: 50.0000 mg | ORAL_TABLET | Freq: Three times a day (TID) | ORAL | 0 refills | Status: AC

## 2024-08-27 NOTE — Telephone Encounter (Signed)
Wrong dose requested.

## 2024-08-27 NOTE — Telephone Encounter (Signed)
 Please advise. Medication has never been filled by freddrick Sherlynn Madden, MD Medication was ordered from Tobie Yetta HERO, MD  when pt was in [facility] March 2024.

## 2024-08-27 NOTE — Telephone Encounter (Signed)
 Copied from CRM [id number]. Topic: Clinical - Prescription Issue >> Aug 27, 2024 10:17 AM [name] wrote: Reason for CRM: Patient called and stated that he is needing another prescription for tamsulosin  (FLOMAX ) 0.4 MG CAPS capsule. As the pharmacy will not fill the medication and stated that there was not enough information from the provider to submit for a refill. Please contact patient at [phone] for additional information.

## 2024-08-28 MED ORDER — TAMSULOSIN HCL 0.4 MG PO CAPS: 0.4000 mg | ORAL_CAPSULE | Freq: Every day | ORAL | 0 refills | Status: AC

## 2024-09-02 ENCOUNTER — Encounter: Payer: Self-pay | Admitting: Sports Medicine

## 2024-09-02 ENCOUNTER — Ambulatory Visit (INDEPENDENT_AMBULATORY_CARE_PROVIDER_SITE_OTHER): Admitting: Sports Medicine

## 2024-09-02 VITALS — BP 146/88 | HR 82 | Temp 98.1°F | Ht 74.0 in | Wt 252.4 lb

## 2024-09-02 DIAGNOSIS — I872 Venous insufficiency (chronic) (peripheral): Secondary | ICD-10-CM | POA: Diagnosis not present

## 2024-09-02 DIAGNOSIS — R569 Unspecified convulsions: Secondary | ICD-10-CM

## 2024-09-02 DIAGNOSIS — I10 Essential (primary) hypertension: Secondary | ICD-10-CM

## 2024-09-02 DIAGNOSIS — N400 Enlarged prostate without lower urinary tract symptoms: Secondary | ICD-10-CM | POA: Diagnosis not present

## 2024-09-02 LAB — BASIC METABOLIC PANEL WITH GFR
BUN: 14 mg/dL (ref 7–25)
CO2: 28 mmol/L (ref 20–32)
Calcium: 10 mg/dL (ref 8.6–10.3)
Chloride: 101 mmol/L (ref 98–110)
Creat: 0.96 mg/dL (ref 0.70–1.28)
Glucose, Bld: 102 mg/dL — ABNORMAL HIGH (ref 65–99)
Potassium: 3.5 mmol/L (ref 3.5–5.3)
Sodium: 141 mmol/L (ref 135–146)
eGFR: 81 mL/min/1.73m2 (ref >=60)

## 2024-09-02 NOTE — Progress Notes (Signed)
 Careteam: Patient Care Team: Sherlynn Madden, MD as PCP - General (Internal Medicine)  PLACE OF SERVICE:  [facility]  Advanced Directive information    No Known Allergies  Chief Complaint  Patient presents with   Medical Management of Chronic Issues    3 month follow up  no concerns  Patient denied flu vaccine for today but will be getting it. Patient denied COVID vaccine.      Discussed the use of AI scribe software for clinical note transcription with the patient, who gave verbal consent to proceed.  History of Present Illness  Edward Wolf is a 78 year old male with hypertension and leg swelling who presents for follow-up.  He notes improvement in leg swelling. He takes his diuretic medication only when necessary and does not use compression stockings, preferring not to wear socks. He was previously referred to a vascular specialist but did not attend the appointment, feeling the medication was effective.  He has hypertension and monitors his blood pressure at home, with readings around 145/83 mmHg and a pulse of 81. He did not take his blood pressure medication on the morning of the visit, as he waits to eat before taking his pills, but confirms adherence to his prescribed medications otherwise.  No respiratory issues are reported. He uses an inhaler at night if needed, which effectively clears his lungs. No cough, congestion, or chest pain during physical activities.  He reports no urinary symptoms such as burning or blood in the urine, and no blood in the stool. He also denies joint pain, attributing this to his diet, specifically avoiding pork.  He maintains an active lifestyle, swimming and exercising regularly, and attends a sauna once a week.    Review of Systems:  Review of Systems  Constitutional:  Negative for chills and fever.  HENT:  Negative for congestion and sore throat.   Respiratory:  Negative for cough, sputum production and shortness of breath.    Cardiovascular:  Positive for leg swelling. Negative for chest pain and palpitations.  Gastrointestinal:  Negative for abdominal pain, heartburn and nausea.  Genitourinary:  Negative for dysuria, frequency and hematuria.  Musculoskeletal:  Negative for falls.  Neurological:  Negative for dizziness.   Negative unless indicated in HPI.   Past Medical History:  Diagnosis Date   Arthritis    Asthma    Colon polyps    Gout    Hay fever    Hyperlipidemia    Hypertension    Positive TB test    Seizures (HCC)    Urinary retention with incomplete bladder emptying    Past Surgical History:  Procedure Laterality Date   COLONOSCOPY     HERNIA REPAIR  1998   TOOTH EXTRACTION     WRIST SURGERY  1964   Social History:   reports that he has quit smoking. His smoking use included cigarettes. He has never used smokeless tobacco. He reports that he does not currently use alcohol. He reports that he does not currently use drugs after having used the following drugs: Marijuana.  Family History  Problem Relation Age of Onset   Arthritis Mother    Hyperlipidemia Mother    Heart disease Mother    Hypertension Mother    Uterine cancer Mother    Arthritis Father    Hyperlipidemia Father    Heart disease Father    Hypertension Father    Tuberculosis Father    Hyperlipidemia Sister    Hypertension Sister    Heart  disease Sister    Arthritis Maternal Grandmother    Arthritis Maternal Grandfather    Arthritis Paternal Grandmother    Arthritis Paternal Grandfather    Prostate cancer Neg Hx     Medications: Patient's Medications  New Prescriptions   No medications on file  Previous Medications   ACETAMINOPHEN  (TYLENOL ) 500 MG TABLET    Take 500 mg by mouth 2 (two) times daily as needed for mild pain.   ALBUTEROL  (VENTOLIN  HFA) 108 (90 BASE) MCG/ACT INHALER    Inhale 1 puff into the lungs every 6 (six) hours as needed for wheezing or shortness of breath.   ALLOPURINOL  (ZYLOPRIM ) 300 MG  TABLET    Take 1 tablet (300 mg total) by mouth daily.   BUDESONIDE -FORMOTEROL  (SYMBICORT ) 160-4.5 MCG/ACT INHALER    Inhale 2 puffs into the lungs 2 (two) times daily.   FINASTERIDE  (PROSCAR ) 5 MG TABLET    Take 5 mg by mouth daily.   FUROSEMIDE  (LASIX ) 20 MG TABLET    Take 2 tablets (40 mg total) by mouth daily for 2 days, THEN 1 tablet (20 mg total) daily as needed for up to 2 days (take if increased ankle edema).   HYDRALAZINE  (APRESOLINE ) 50 MG TABLET    Take 1 tablet (50 mg total) by mouth 3 (three) times daily.   KETOCONAZOLE  (NIZORAL ) 2 % CREAM    Apply 1 Application topically daily.   KLOR-CON  M20 20 MEQ TABLET    TAKE 1 TABLET BY MOUTH EVERY DAY   LEVETIRACETAM  (KEPPRA ) 500 MG TABLET    Take 1 tablet (500 mg total) by mouth 2 (two) times daily.   MINOCYCLINE  (MINOCIN ) 100 MG CAPSULE    TAKE 1 CAPSULE (100 MG TOTAL) BY MOUTH DAILY AS NEEDED (URINARY PROBLEMS).   MULTIPLE VITAMIN (MULTIVITAMIN) TABLET    Take 1 tablet by mouth every other day.   ROSUVASTATIN  (CRESTOR ) 10 MG TABLET    TAKE 1 TABLET BY MOUTH EVERY DAY   TAMSULOSIN  (FLOMAX ) 0.4 MG CAPS CAPSULE    Take 1 capsule (0.4 mg total) by mouth daily.   VALSARTAN -HYDROCHLOROTHIAZIDE  (DIOVAN -HCT) 320-25 MG TABLET    TAKE 1 TABLET BY MOUTH EVERY DAY  Modified Medications   No medications on file  Discontinued Medications   No medications on file    Physical Exam: Vitals:   09/02/24 0940  BP: (!) 148/96  Pulse: 82  Temp: 98.1 F (36.7 C)  SpO2: 98%  Weight: 252 lb 6.4 oz (114.5 kg)  Height: 6' 2 (1.88 m)   Body mass index is 32.41 kg/m. BP Readings from Last 3 Encounters:  09/02/24 (!) 148/96  07/10/24 (!) 142/86  06/16/24 138/76   Wt Readings from Last 3 Encounters:  09/02/24 252 lb 6.4 oz (114.5 kg)  07/10/24 245 lb 9.6 oz (111.4 kg)  06/16/24 246 lb (111.6 kg)    Physical Exam Constitutional:      Appearance: Normal appearance.  HENT:     Head: Normocephalic and atraumatic.  Cardiovascular:     Rate and  Rhythm: Normal rate and regular rhythm.     Pulses: Normal pulses.     Heart sounds: Normal heart sounds.  Pulmonary:     Effort: No respiratory distress.     Breath sounds: No stridor. No wheezing or rales.  Abdominal:     General: Bowel sounds are normal. There is no distension.     Palpations: Abdomen is soft.     Tenderness: There is no abdominal tenderness. There is no  right CVA tenderness or guarding.  Musculoskeletal:        General: Swelling present.     Comments: 1+ pitting odema   Neurological:     Mental Status: He is alert. Mental status is at baseline.     Motor: No weakness.     Labs reviewed: Basic Metabolic Panel: Recent Labs    10/01/23 1048 02/19/24 1005  NA  --  141  K 3.8 3.5  CL  --  101  CO2  --  31  GLUCOSE  --  97  BUN  --  11  CREATININE  --  0.82  CALCIUM   --  9.7   Liver Function Tests: Recent Labs    02/19/24 1005  AST 20  ALT 22  BILITOT 0.7  PROT 7.6   No results for input(s): LIPASE, AMYLASE in the last 8760 hours. No results for input(s): AMMONIA in the last 8760 hours. CBC: No results for input(s): WBC, NEUTROABS, HGB, HCT, MCV, PLT in the last 8760 hours. Lipid Panel: Recent Labs    02/19/24 1005  CHOL 169  HDL 43  LDLCALC 106*  TRIG 106  CHOLHDL 3.9   TSH: No results for input(s): TSH in the last 8760 hours. A1C: No results found for: HGBA1C  Assessment and Plan Assessment & Plan   1. Primary hypertension (Primary)  146/88 He did not take his morning meds Instructed patient to monitor bp daily  Avoid salty foods Will check bmp - Basic Metabolic Panel  2. Venous insufficiency  Chronic  Ankle swelling + He does not want to wear compression stockings Limit salt intake  Elevate feet   3. Seizures (HCC)  Followed with neurology  Cont with keppra   4. Benign prostatic hyperplasia, unspecified whether lower urinary tract symptoms present  Stable Cont with flomax 

## 2024-09-03 ENCOUNTER — Ambulatory Visit: Payer: Self-pay | Admitting: Sports Medicine

## 2024-10-22 ENCOUNTER — Ambulatory Visit: Admitting: Family

## 2024-10-22 DIAGNOSIS — Z23 Encounter for immunization: Secondary | ICD-10-CM

## 2024-10-28 ENCOUNTER — Other Ambulatory Visit (HOSPITAL_COMMUNITY): Payer: Self-pay

## 2024-11-17 ENCOUNTER — Other Ambulatory Visit (HOSPITAL_COMMUNITY): Payer: Self-pay

## 2024-11-19 ENCOUNTER — Other Ambulatory Visit (HOSPITAL_COMMUNITY): Payer: Self-pay

## 2024-11-20 ENCOUNTER — Other Ambulatory Visit: Payer: Self-pay

## 2024-11-20 DIAGNOSIS — R972 Elevated prostate specific antigen [PSA]: Secondary | ICD-10-CM

## 2024-11-21 ENCOUNTER — Other Ambulatory Visit: Payer: Self-pay

## 2024-11-21 ENCOUNTER — Other Ambulatory Visit (HOSPITAL_COMMUNITY): Payer: Self-pay

## 2024-11-23 ENCOUNTER — Other Ambulatory Visit (HOSPITAL_COMMUNITY): Payer: Self-pay

## 2024-11-23 MED ORDER — TAMSULOSIN HCL 0.4 MG PO CAPS
0.4000 mg | ORAL_CAPSULE | Freq: Two times a day (BID) | ORAL | 3 refills | Status: DC
  Filled 2024-11-23: qty 180, 90d supply, fill #0

## 2024-11-23 MED ORDER — SILDENAFIL CITRATE 100 MG PO TABS
100.0000 mg | ORAL_TABLET | Freq: Every day | ORAL | 3 refills | Status: AC | PRN
  Filled 2024-11-23: qty 30, 30d supply, fill #0

## 2024-11-23 MED ORDER — FINASTERIDE 5 MG PO TABS
5.0000 mg | ORAL_TABLET | Freq: Every day | ORAL | 3 refills | Status: AC
  Filled 2024-11-23: qty 90, 90d supply, fill #0

## 2024-11-24 ENCOUNTER — Other Ambulatory Visit (HOSPITAL_COMMUNITY): Payer: Self-pay

## 2024-11-26 ENCOUNTER — Other Ambulatory Visit (HOSPITAL_COMMUNITY): Payer: Self-pay

## 2024-12-16 ENCOUNTER — Encounter: Payer: Self-pay | Admitting: Family

## 2024-12-16 ENCOUNTER — Ambulatory Visit: Payer: Self-pay | Admitting: Family

## 2024-12-16 VITALS — BP 140/90 | HR 91 | Temp 97.8°F | Resp 19 | Ht 74.0 in | Wt 254.4 lb

## 2024-12-16 DIAGNOSIS — I10 Essential (primary) hypertension: Secondary | ICD-10-CM | POA: Diagnosis not present

## 2024-12-16 DIAGNOSIS — R609 Edema, unspecified: Secondary | ICD-10-CM

## 2024-12-16 DIAGNOSIS — R600 Localized edema: Secondary | ICD-10-CM | POA: Diagnosis not present

## 2024-12-16 DIAGNOSIS — F1029 Alcohol dependence with unspecified alcohol-induced disorder: Secondary | ICD-10-CM | POA: Diagnosis not present

## 2024-12-16 DIAGNOSIS — J449 Chronic obstructive pulmonary disease, unspecified: Secondary | ICD-10-CM | POA: Diagnosis not present

## 2024-12-16 DIAGNOSIS — I5022 Chronic systolic (congestive) heart failure: Secondary | ICD-10-CM

## 2024-12-16 DIAGNOSIS — E782 Mixed hyperlipidemia: Secondary | ICD-10-CM | POA: Diagnosis not present

## 2024-12-16 DIAGNOSIS — R569 Unspecified convulsions: Secondary | ICD-10-CM

## 2024-12-16 DIAGNOSIS — Z125 Encounter for screening for malignant neoplasm of prostate: Secondary | ICD-10-CM

## 2024-12-16 DIAGNOSIS — N400 Enlarged prostate without lower urinary tract symptoms: Secondary | ICD-10-CM

## 2024-12-16 MED ORDER — FUROSEMIDE 20 MG PO TABS: 20.0000 mg | ORAL_TABLET | Freq: Every day | ORAL | Status: AC | PRN

## 2024-12-18 ENCOUNTER — Ambulatory Visit: Payer: Self-pay | Admitting: Family

## 2024-12-18 DIAGNOSIS — E782 Mixed hyperlipidemia: Secondary | ICD-10-CM

## 2024-12-18 DIAGNOSIS — T50905D Adverse effect of unspecified drugs, medicaments and biological substances, subsequent encounter: Secondary | ICD-10-CM

## 2024-12-18 LAB — CBC WITH DIFFERENTIAL/PLATELET
Absolute Lymphocytes: 2191 {cells}/uL (ref 850–3900)
Absolute Monocytes: 553 {cells}/uL (ref 200–950)
Basophils Absolute: 33 {cells}/uL (ref 0–200)
Basophils Relative: 0.5 %
Eosinophils Absolute: 605 {cells}/uL — ABNORMAL HIGH (ref 15–500)
Eosinophils Relative: 9.3 %
HCT: 45.3 % (ref 39.4–51.1)
Hemoglobin: 15 g/dL (ref 13.2–17.1)
MCH: 30 pg (ref 27.0–33.0)
MCHC: 33.1 g/dL (ref 31.6–35.4)
MCV: 90.6 fL (ref 81.4–101.7)
MPV: 10.1 fL (ref 7.5–12.5)
Monocytes Relative: 8.5 %
Neutro Abs: 3120 {cells}/uL (ref 1500–7800)
Neutrophils Relative %: 48 %
Platelets: 279 Thousand/uL (ref 140–400)
RBC: 5 Million/uL (ref 4.20–5.80)
RDW: 13.7 % (ref 11.0–15.0)
Total Lymphocyte: 33.7 %
WBC: 6.5 Thousand/uL (ref 3.8–10.8)

## 2024-12-18 LAB — COMPLETE METABOLIC PANEL WITHOUT GFR
AG Ratio: 1.6 (calc) (ref 1.0–2.5)
ALT: 21 U/L (ref 9–46)
AST: 18 U/L (ref 10–35)
Albumin: 4.8 g/dL (ref 3.6–5.1)
Alkaline phosphatase (APISO): 77 U/L (ref 35–144)
BUN: 12 mg/dL (ref 7–25)
CO2: 28 mmol/L (ref 20–32)
Calcium: 9.7 mg/dL (ref 8.6–10.3)
Chloride: 103 mmol/L (ref 98–110)
Creat: 0.84 mg/dL (ref 0.70–1.28)
Globulin: 3 g/dL (ref 1.9–3.7)
Glucose, Bld: 98 mg/dL (ref 65–99)
Potassium: 3.5 mmol/L (ref 3.5–5.3)
Sodium: 141 mmol/L (ref 135–146)
Total Bilirubin: 1.1 mg/dL (ref 0.2–1.2)
Total Protein: 7.8 g/dL (ref 6.1–8.1)

## 2024-12-18 LAB — LIPID PANEL
Cholesterol: 227 mg/dL — ABNORMAL HIGH
HDL: 43 mg/dL
LDL Cholesterol (Calc): 150 mg/dL — ABNORMAL HIGH
Non-HDL Cholesterol (Calc): 184 mg/dL — ABNORMAL HIGH
Total CHOL/HDL Ratio: 5.3 (calc) — ABNORMAL HIGH
Triglycerides: 206 mg/dL — ABNORMAL HIGH

## 2024-12-18 LAB — PSA, TOTAL AND FREE
PSA, % Free: 23 % — ABNORMAL LOW
PSA, Free: 0.7 ng/mL
PSA, Total: 3 ng/mL

## 2024-12-18 LAB — TSH: TSH: 1.24 m[IU]/L (ref 0.40–4.50)

## 2024-12-20 NOTE — Progress Notes (Signed)
 "  Provider: Jessie Schrieber FNP-C   Edward Wolf, Edward BROCKS, NP  Patient Care Team: Edward Wolf, Edward BROCKS, NP as PCP - General (Family Medicine)  Extended Emergency Contact Information Primary Emergency Contact: Kimbrell,Edith  [place] Home Phone: [phone] Relation: Spouse Secondary Emergency Contact: Anderson,Kevin Mobile Phone: [phone] Relation: Son  Code Status:  Full Code  Goals of care: Advanced Directive information    12/16/2024    9:31 AM  Advanced Directives  Does Patient Have a Medical Advance Directive? No  Would patient like information on creating a medical advance directive? No - Patient declined     Chief Complaint  Patient presents with   Medical Management of Chronic Issues    3 Month follow up.    History of Present Illness   Edward Wolf is a 79 year old male who presents for a three-month follow-up visit.  He has a history of COPD and asthma. He no longer uses albuterol  but instead uses Symbicort  as needed once or twice a day, although he does not use it twice daily as previously instructed. He has an old albuterol  inhaler that he does not use.  He has a history of gout, which is improving. He takes allopurinol  and notes that the gout bump on his left foot is almost gone. No recent gout flare-ups.  He is prescribed finasteride  and tamsulosin  for prostate issues and takes them once daily. He denies any diagnosis of prostate cancer and is scheduled for a prostate MRI later this month.  He takes furosemide  as needed for swelling in his legs but does not take it regularly as he does not feel the need. His legs have not been swollen recently.  He takes hydralazine  three times daily for blood pressure and reports his blood pressure readings at home are usually around 150/90 mmHg before medication. He also takes valsartan  with hydrochlorothiazide  daily for blood pressure management.  He takes Keppra  500 mg twice daily for seizures, although he has not  had a seizure in six years. He attributes his past seizure to a combination of drugs.  He takes rosuvastatin  10 mg daily for cholesterol management and reports no issues with this medication.  He drinks at least two alcoholic drinks a day and wants to cut down. He has an allergy to sugar substitutes, which causes his feet to swell.  He engages in cardio exercise once a week and visits the [employer] twice a week for sauna, steam, swimming, and whirlpool activities. No shortness of breath during exercise.  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    Past Medical History:  Diagnosis Date   Arthritis    Asthma    Colon polyps    Gout    Hay fever    Hyperlipidemia    Hypertension    Positive TB test    Seizures (HCC)    Urinary retention with incomplete bladder emptying    Past Surgical History:  Procedure Laterality Date   COLONOSCOPY     HERNIA REPAIR  1998   TOOTH EXTRACTION     WRIST SURGERY  1964    Allergies[1]  Allergies as of 12/16/2024   No Known Allergies      Medication List        Accurate as of December 16, 2024 11:59 PM. If you have any questions, ask your nurse or doctor.          acetaminophen  500 MG tablet Commonly  known as: TYLENOL  Take 500 mg by mouth 2 (two) times daily as needed for mild pain.   albuterol  108 (90 Base) MCG/ACT inhaler Commonly known as: VENTOLIN  HFA Inhale 1 puff into the lungs every 6 (six) hours as needed for wheezing or shortness of breath.   allopurinol  300 MG tablet Commonly known as: ZYLOPRIM  Take 1 tablet (300 mg total) by mouth daily.   budesonide -formoterol  160-4.5 MCG/ACT inhaler Commonly known as: SYMBICORT  Inhale 2 puffs into the lungs 2 (two) times daily.   finasteride  5 MG tablet Commonly known as: PROSCAR  Take 1 tablet (5 mg total) by mouth daily.   furosemide  20 MG tablet Commonly known as: LASIX  Take 1 tablet (20 mg total) by mouth daily as needed (take if  increased ankle edema). What changed: See the new instructions. Changed by: Ronita Hargreaves, NP   hydrALAZINE  50 MG tablet Commonly known as: APRESOLINE  Take 1 tablet (50 mg total) by mouth 3 (three) times daily.   ketoconazole  2 % cream Commonly known as: NIZORAL  Apply 1 Application topically daily.   Klor-Con  M20 20 MEQ tablet Generic drug: potassium chloride  SA TAKE 1 TABLET BY MOUTH EVERY DAY   levETIRAcetam  500 MG tablet Commonly known as: Keppra  Take 1 tablet (500 mg total) by mouth 2 (two) times daily.   minocycline  100 MG capsule Commonly known as: MINOCIN  TAKE 1 CAPSULE (100 MG TOTAL) BY MOUTH DAILY AS NEEDED (URINARY PROBLEMS).   multivitamin tablet Take 1 tablet by mouth every other day.   rosuvastatin  10 MG tablet Commonly known as: CRESTOR  TAKE 1 TABLET BY MOUTH EVERY DAY   sildenafil  100 MG tablet Commonly known as: VIAGRA  Take 1 tablet (100 mg total) by mouth daily as needed.   tamsulosin  0.4 MG Caps capsule Commonly known as: FLOMAX  Take 1 capsule (0.4 mg total) by mouth daily. What changed: Another medication with the same name was removed. Continue taking this medication, and follow the directions you see here. Changed by: Kayley Zeiders, NP   valsartan -hydrochlorothiazide  320-25 MG tablet Commonly known as: DIOVAN -HCT TAKE 1 TABLET BY MOUTH EVERY DAY        Review of Systems  Constitutional:  Negative for appetite change, chills, fatigue, fever and unexpected weight change.  HENT:  Negative for congestion, dental problem, ear discharge, ear pain, facial swelling, hearing loss, nosebleeds, postnasal drip, rhinorrhea, sinus pressure, sinus pain, sneezing, sore throat, tinnitus and trouble swallowing.   Eyes:  Negative for pain, discharge, redness, itching and visual disturbance.  Respiratory:  Negative for cough, chest tightness, shortness of breath and wheezing.   Cardiovascular:  Negative for chest pain, palpitations and leg swelling.   Gastrointestinal:  Negative for abdominal distention, abdominal pain, constipation, diarrhea, nausea and vomiting.  Endocrine: Negative for cold intolerance, heat intolerance, polydipsia, polyphagia and polyuria.  Genitourinary:  Negative for difficulty urinating, dysuria, flank pain, frequency and urgency.  Musculoskeletal:  Negative for arthralgias, back pain, gait problem, joint swelling, myalgias, neck pain and neck stiffness.  Skin:  Negative for color change, pallor, rash and wound.  Neurological:  Negative for dizziness, syncope, speech difficulty, weakness, light-headedness, numbness and headaches.  Hematological:  Does not bruise/bleed easily.  Psychiatric/Behavioral:  Negative for agitation, behavioral problems, confusion, hallucinations, self-injury, sleep disturbance and suicidal ideas. The patient is not nervous/anxious.     Immunization History  Administered Date(s) Administered   Fluad Quad(high Dose 65+) 09/05/2021, 09/12/2022   Fluad Trivalent(High Dose 65+) 08/28/2023   INFLUENZA, HIGH DOSE SEASONAL PF 10/03/2018, 10/22/2024   Influenza,inj,Quad PF,6+  Mos 08/24/2014   Janssen (J&J) SARS-COV-2 Vaccination 04/06/2020   PNEUMOCOCCAL CONJUGATE-20 10/01/2023   Pneumococcal Polysaccharide-23 07/14/2012   Td 07/14/2012   Tdap 07/14/2012   Pertinent  Health Maintenance Due  Topic Date Due   Influenza Vaccine  Completed      02/19/2024    9:22 AM 02/19/2024    9:42 AM 05/26/2024    9:51 AM 09/02/2024   10:15 AM 12/16/2024    9:31 AM  Fall Risk  Falls in the past year? 0 0 0 1 0  Was there an injury with Fall? 0  1  0  1  0  Fall Risk Category Calculator 0 1 0 2 0  Patient at Risk for Falls Due to No Fall Risks    No Fall Risks  Fall risk Follow up Falls evaluation completed  Falls evaluation completed  Falls evaluation completed     Data saved with a previous flowsheet row definition   Functional Status Survey:    Vitals:   12/16/24 0940 12/16/24 1019  BP: (!)  150/90 (!) 140/90  Pulse: 91   Resp: 19   Temp: 97.8 F (36.6 C)   SpO2: 99%   Weight: 254 lb 6.4 oz (115.4 kg)   Height: 6' 2 (1.88 m)    Body mass index is 32.66 kg/m. Physical Exam  VITALS: BP- 140/90 GENERAL: Alert, cooperative, well developed, no acute distress. HEENT: Normocephalic, normal oropharynx, moist mucous membranes, ears normal without cerumen impaction, nose normal, no sinus tenderness. NECK: No cervical lymphadenopathy. CHEST: Clear to auscultation bilaterally, no wheezes, rhonchi, or crackles. CARDIOVASCULAR: Normal heart rate and rhythm, S1 and S2 normal without murmurs. ABDOMEN: Soft, non-tender, non-distended, without organomegaly, normal bowel sounds, liver non-tender. EXTREMITIES: Bilateral lower extremity edema, no cyanosis. MUSCULOSKELETAL: Normal range of motion, non-tender. NEUROLOGICAL: Cranial nerves grossly intact, moves all extremities without gross motor deficit, decreased sensation in fingertips and right foot.  SKIN: No rash,no lesion or erythema   PSYCHIATRY/BEHAVIORAL: Mood stable    Labs reviewed: Recent Labs    02/19/24 1005 09/02/24 1025 12/16/24 1033  NA 141 141 141  K 3.5 3.5 3.5  CL 101 101 103  CO2 31 28 28   GLUCOSE 97 102* 98  BUN 11 14 12   CREATININE 0.82 0.96 0.84  CALCIUM  9.7 10.0 9.7   Recent Labs    02/19/24 1005 12/16/24 1033  AST 20 18  ALT 22 21  BILITOT 0.7 1.1  PROT 7.6 7.8   Recent Labs    12/16/24 1033  WBC 6.5  NEUTROABS 3,120  HGB 15.0  HCT 45.3  MCV 90.6  PLT 279   Lab Results  Component Value Date   TSH 1.24 12/16/2024   No results found for: HGBA1C Lab Results  Component Value Date   CHOL 227 (H) 12/16/2024   HDL 43 12/16/2024   LDLCALC 150 (H) 12/16/2024   TRIG 206 (H) 12/16/2024   CHOLHDL 5.3 (H) 12/16/2024    Significant Diagnostic Results in last 30 days:  No results found.  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 Swelling persists despite current management. [Sentence deleted]  - Take furosemide  20 mg once daily for three days to reduce swelling. - Use compression stockings daily to manage edema. - Monitor weight daily to assess fluid retention. - Avoid excessive salt intake. - Referred to podiatrist for evaluation of callus and gout bump.  Primary hypertension Hypertension with home blood pressure  readings averaging 140/90 mmHg. Current management includes hydralazine  and valsartan  with hydrochlorothiazide . Blood pressure remains elevated, especially before medication. - Continue current antihypertensive regimen. - Monitor blood pressure at home, especially after taking medication. - Report if blood pressure remains consistently high after furosemide  treatment.  Chronic obstructive pulmonary disease COPD. No recent use of albuterol . Uses Symbicort  as needed, not daily as prescribed. - Ensure albuterol  inhaler is not expired and use as needed for wheezing or shortness of breath. - Continue Symbicort  as prescribed, twice daily.  Gout Left big toe, currently resolving. No recent flare-ups. Gout bump is decreasing in size. - Continue allopurinol  to prevent future gout attacks. - Avoid foods that trigger gout.  Seizure disorder Seizure disorder, well-controlled for six years. Currently on Keppra  500 mg twice daily. No recent seizures. Considering discontinuation of medication. - Continue Keppra  500 mg twice daily. - Follow up with neurologist to discuss potential weaning off Keppra .  Benign prostatic hyperplasia BPH managed with finasteride  and tamsulosin . No recent PSA check since 2016. Upcoming MRI for prostate evaluation. - Continue finasteride  and tamsulosin  as prescribed. - Ordered PSA test for screening. - Ensure urologist sends records to primary care.  Mixed hyperlipidemia Managed with rosuvastatin  10 mg daily. No reported issues with  medication. - Continue rosuvastatin  10 mg daily.  Alcohol dependence Alcohol consumption reduced to two drinks per day. Advised to further reduce intake due to potential impact on edema and overall health. - Continue to reduce alcohol intake further.  Peripheral neuropathy Fingertips and right foot, likely related to past occupational exposure. Sensation deficits noted on examination. - Continue to monitor symptoms and manage conservatively.  General health maintenance Engages in regular exercise, including cardio and swimming. Multivitamin taken every other day. No recent PSA check since 2016. - Continue regular exercise regimen. - Continue multivitamin every other day. - Ordered PSA test for screening.   Family/ staff Communication: Reviewed plan of care with patient verbalized understanding   Labs/tests ordered:  - CBC with Differential/Platelet - CMP with eGFR([facility]) - TSH - PSA  - Lipid panel  Next Appointment : Return in about 6 months (around 06/15/2025) for medical mangement of chronic issues.SABRA   Spent 30 minutes of Face to face and non-face to face with patient  >50% time spent counseling; reviewing medical record; tests; labs; documentation and developing future plan of care.   Edward Wolf Edward Disney, NP      [1] No Known Allergies  "

## 2024-12-21 MED ORDER — ROSUVASTATIN CALCIUM 20 MG PO TABS: 20.0000 mg | ORAL_TABLET | Freq: Every day | ORAL | 1 refills | Status: AC

## 2024-12-27 ENCOUNTER — Ambulatory Visit: Admission: RE | Admit: 2024-12-27 | Discharge: 2024-12-27 | Disposition: A | Source: Ambulatory Visit

## 2024-12-27 DIAGNOSIS — R972 Elevated prostate specific antigen [PSA]: Secondary | ICD-10-CM

## 2024-12-27 MED ORDER — GADOPICLENOL 0.5 MMOL/ML IV SOLN
10.0000 mL | Freq: Once | INTRAVENOUS | Status: AC | PRN
  Administered 2024-12-27: 10 mL via INTRAVENOUS

## 2025-01-12 ENCOUNTER — Other Ambulatory Visit (HOSPITAL_COMMUNITY): Payer: Self-pay

## 2025-01-22 ENCOUNTER — Other Ambulatory Visit

## 2025-01-22 DIAGNOSIS — E782 Mixed hyperlipidemia: Secondary | ICD-10-CM

## 2025-01-22 DIAGNOSIS — T50905D Adverse effect of unspecified drugs, medicaments and biological substances, subsequent encounter: Secondary | ICD-10-CM

## 2025-06-17 ENCOUNTER — Ambulatory Visit: Admitting: Family

## 2025-06-22 ENCOUNTER — Ambulatory Visit: Admitting: Neurology
